# Patient Record
Sex: Female | Born: 1980 | Race: White | Hispanic: No | Smoking: Former smoker
Health system: Southern US, Community
[De-identification: ages and names within clinical notes are randomized; demographics above are authoritative.]

## PROBLEM LIST (undated history)

## (undated) DIAGNOSIS — E7800 Pure hypercholesterolemia, unspecified: Secondary | ICD-10-CM

## (undated) DIAGNOSIS — G43909 Migraine, unspecified, not intractable, without status migrainosus: Secondary | ICD-10-CM

## (undated) DIAGNOSIS — L709 Acne, unspecified: Secondary | ICD-10-CM

## (undated) DIAGNOSIS — E669 Obesity, unspecified: Secondary | ICD-10-CM

## (undated) DIAGNOSIS — I10 Essential (primary) hypertension: Secondary | ICD-10-CM

## (undated) DIAGNOSIS — M1990 Unspecified osteoarthritis, unspecified site: Secondary | ICD-10-CM

## (undated) DIAGNOSIS — F17200 Nicotine dependence, unspecified, uncomplicated: Secondary | ICD-10-CM

## (undated) DIAGNOSIS — F32A Depression, unspecified: Secondary | ICD-10-CM

## (undated) HISTORY — DX: Acne, unspecified: L70.9

## (undated) HISTORY — PX: CHOLECYSTECTOMY: SHX55

## (undated) HISTORY — DX: Obesity, unspecified: E66.9

## (undated) HISTORY — DX: Unspecified osteoarthritis, unspecified site: M19.90

## (undated) HISTORY — DX: Pure hypercholesterolemia, unspecified: E78.00

## (undated) HISTORY — DX: Nicotine dependence, unspecified, uncomplicated: F17.200

## (undated) HISTORY — DX: Essential (primary) hypertension: I10

## (undated) HISTORY — DX: Migraine, unspecified, not intractable, without status migrainosus: G43.909

## (undated) HISTORY — DX: Depression, unspecified: F32.A

---

## 2001-03-25 ENCOUNTER — Emergency Department (HOSPITAL_COMMUNITY): Admission: EM | Admit: 2001-03-25 | Discharge: 2001-03-25 | Payer: Self-pay | Admitting: *Deleted

## 2001-12-30 ENCOUNTER — Emergency Department (HOSPITAL_COMMUNITY): Admission: EM | Admit: 2001-12-30 | Discharge: 2001-12-30 | Payer: Self-pay | Admitting: Emergency Medicine

## 2002-03-22 ENCOUNTER — Emergency Department (HOSPITAL_COMMUNITY): Admission: EM | Admit: 2002-03-22 | Discharge: 2002-03-22 | Payer: Self-pay | Admitting: *Deleted

## 2003-06-21 ENCOUNTER — Ambulatory Visit (HOSPITAL_COMMUNITY): Admission: RE | Admit: 2003-06-21 | Discharge: 2003-06-21 | Payer: Self-pay | Admitting: Family Medicine

## 2003-06-24 ENCOUNTER — Ambulatory Visit (HOSPITAL_COMMUNITY): Admission: RE | Admit: 2003-06-24 | Discharge: 2003-06-24 | Payer: Self-pay | Admitting: Family Medicine

## 2003-06-25 ENCOUNTER — Emergency Department (HOSPITAL_COMMUNITY): Admission: EM | Admit: 2003-06-25 | Discharge: 2003-06-25 | Payer: Self-pay | Admitting: Emergency Medicine

## 2003-06-28 ENCOUNTER — Observation Stay (HOSPITAL_COMMUNITY): Admission: RE | Admit: 2003-06-28 | Discharge: 2003-06-29 | Payer: Self-pay | Admitting: General Surgery

## 2004-05-21 ENCOUNTER — Ambulatory Visit: Payer: Self-pay | Admitting: Family Medicine

## 2004-06-18 ENCOUNTER — Ambulatory Visit: Payer: Self-pay | Admitting: Family Medicine

## 2004-07-10 ENCOUNTER — Emergency Department (HOSPITAL_COMMUNITY): Admission: EM | Admit: 2004-07-10 | Discharge: 2004-07-10 | Payer: Self-pay | Admitting: Emergency Medicine

## 2004-11-30 ENCOUNTER — Emergency Department (HOSPITAL_COMMUNITY): Admission: EM | Admit: 2004-11-30 | Discharge: 2004-11-30 | Payer: Self-pay | Admitting: Emergency Medicine

## 2004-12-06 ENCOUNTER — Ambulatory Visit: Payer: Self-pay | Admitting: Family Medicine

## 2004-12-07 ENCOUNTER — Ambulatory Visit: Payer: Self-pay | Admitting: Family Medicine

## 2004-12-21 ENCOUNTER — Ambulatory Visit (HOSPITAL_COMMUNITY): Admission: RE | Admit: 2004-12-21 | Discharge: 2004-12-21 | Payer: Self-pay | Admitting: Family Medicine

## 2005-01-03 ENCOUNTER — Ambulatory Visit: Payer: Self-pay | Admitting: Family Medicine

## 2005-02-06 ENCOUNTER — Ambulatory Visit: Payer: Self-pay | Admitting: Family Medicine

## 2005-04-03 ENCOUNTER — Ambulatory Visit: Payer: Self-pay | Admitting: Family Medicine

## 2005-06-20 ENCOUNTER — Ambulatory Visit: Payer: Self-pay | Admitting: Family Medicine

## 2005-08-28 ENCOUNTER — Ambulatory Visit: Payer: Self-pay | Admitting: Family Medicine

## 2005-09-25 ENCOUNTER — Ambulatory Visit: Payer: Self-pay | Admitting: Family Medicine

## 2005-10-25 ENCOUNTER — Ambulatory Visit: Payer: Self-pay | Admitting: Family Medicine

## 2005-11-21 ENCOUNTER — Ambulatory Visit: Payer: Self-pay | Admitting: Family Medicine

## 2006-02-05 ENCOUNTER — Ambulatory Visit: Payer: Self-pay | Admitting: Family Medicine

## 2006-03-06 ENCOUNTER — Ambulatory Visit: Payer: Self-pay | Admitting: Family Medicine

## 2006-04-03 ENCOUNTER — Ambulatory Visit: Payer: Self-pay | Admitting: Family Medicine

## 2006-06-24 ENCOUNTER — Ambulatory Visit: Payer: Self-pay | Admitting: Family Medicine

## 2006-07-16 ENCOUNTER — Other Ambulatory Visit: Admission: RE | Admit: 2006-07-16 | Discharge: 2006-07-16 | Payer: Self-pay | Admitting: Family Medicine

## 2006-07-16 ENCOUNTER — Ambulatory Visit: Payer: Self-pay | Admitting: Family Medicine

## 2006-07-18 ENCOUNTER — Encounter: Payer: Self-pay | Admitting: Family Medicine

## 2006-07-18 LAB — CONVERTED CEMR LAB: Chlamydia, DNA Probe: NEGATIVE

## 2006-07-22 ENCOUNTER — Ambulatory Visit: Payer: Self-pay | Admitting: Family Medicine

## 2006-07-22 LAB — CONVERTED CEMR LAB
BUN: 16 mg/dL (ref 6–23)
Calcium: 9.2 mg/dL (ref 8.4–10.5)
Glucose, Bld: 103 mg/dL — ABNORMAL HIGH (ref 70–99)

## 2006-07-23 ENCOUNTER — Ambulatory Visit (HOSPITAL_COMMUNITY): Admission: RE | Admit: 2006-07-23 | Discharge: 2006-07-23 | Payer: Self-pay | Admitting: Family Medicine

## 2006-07-29 ENCOUNTER — Ambulatory Visit (HOSPITAL_COMMUNITY): Admission: RE | Admit: 2006-07-29 | Discharge: 2006-07-29 | Payer: Self-pay | Admitting: Family Medicine

## 2006-07-29 ENCOUNTER — Ambulatory Visit: Payer: Self-pay | Admitting: Family Medicine

## 2006-07-31 ENCOUNTER — Encounter: Payer: Self-pay | Admitting: Family Medicine

## 2006-07-31 LAB — CONVERTED CEMR LAB

## 2006-08-19 ENCOUNTER — Ambulatory Visit: Payer: Self-pay | Admitting: Family Medicine

## 2006-09-03 ENCOUNTER — Ambulatory Visit: Payer: Self-pay | Admitting: Family Medicine

## 2006-09-03 LAB — CONVERTED CEMR LAB: GC Probe Amp, Genital: NEGATIVE

## 2006-09-04 ENCOUNTER — Encounter: Payer: Self-pay | Admitting: Family Medicine

## 2006-09-04 LAB — CONVERTED CEMR LAB
Candida species: NEGATIVE
Trichomonal Vaginitis: NEGATIVE

## 2006-10-16 ENCOUNTER — Ambulatory Visit: Payer: Self-pay | Admitting: Family Medicine

## 2006-11-28 ENCOUNTER — Ambulatory Visit: Payer: Self-pay | Admitting: Family Medicine

## 2006-12-12 ENCOUNTER — Emergency Department (HOSPITAL_COMMUNITY): Admission: EM | Admit: 2006-12-12 | Discharge: 2006-12-12 | Payer: Self-pay | Admitting: Emergency Medicine

## 2006-12-16 ENCOUNTER — Ambulatory Visit: Payer: Self-pay | Admitting: Family Medicine

## 2007-01-21 ENCOUNTER — Ambulatory Visit (HOSPITAL_COMMUNITY): Admission: RE | Admit: 2007-01-21 | Discharge: 2007-01-21 | Payer: Self-pay | Admitting: Family Medicine

## 2007-01-21 ENCOUNTER — Ambulatory Visit: Payer: Self-pay | Admitting: Family Medicine

## 2007-04-30 ENCOUNTER — Encounter: Payer: Self-pay | Admitting: Family Medicine

## 2007-09-23 DIAGNOSIS — IMO0002 Reserved for concepts with insufficient information to code with codable children: Secondary | ICD-10-CM

## 2007-09-23 DIAGNOSIS — M1710 Unilateral primary osteoarthritis, unspecified knee: Secondary | ICD-10-CM | POA: Insufficient documentation

## 2008-01-25 IMAGING — US US PELVIS COMPLETE MODIFY
1 series · 14 of 25 positions shown · non-contrast
Comparison: none

CLINICAL DATA: Low attenuation in adnexal regions noted on CT.  Followup ultrasound.  
TRANSABDOMINAL AND TRANSVAGINAL PELVIC ULTRASOUND:
TECHNIQUE: Both transabdominal and transvaginal ultrasound examinations of the pelvis were performed including evaluation of the uterus, ovaries, adnexal regions, and pelvic cul-de-sac.

[Series 1: us pelvis complete modify · 0.26mm/px · 14 of 62 slices shown]
[im 1/62]
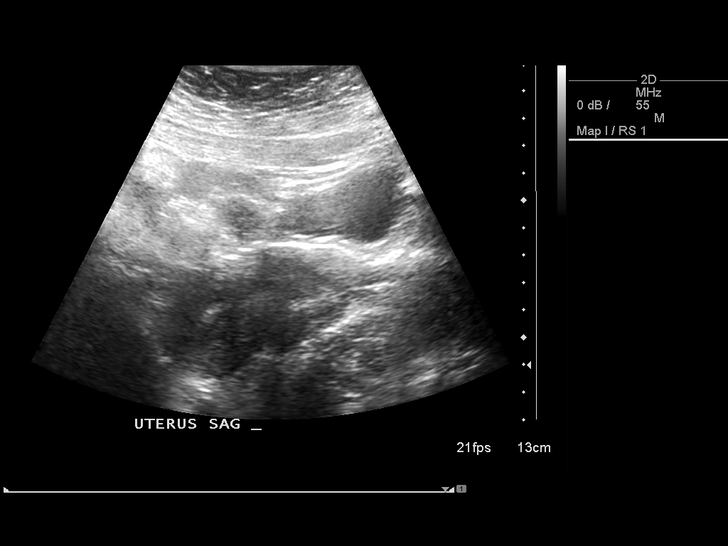
[im 6/62]
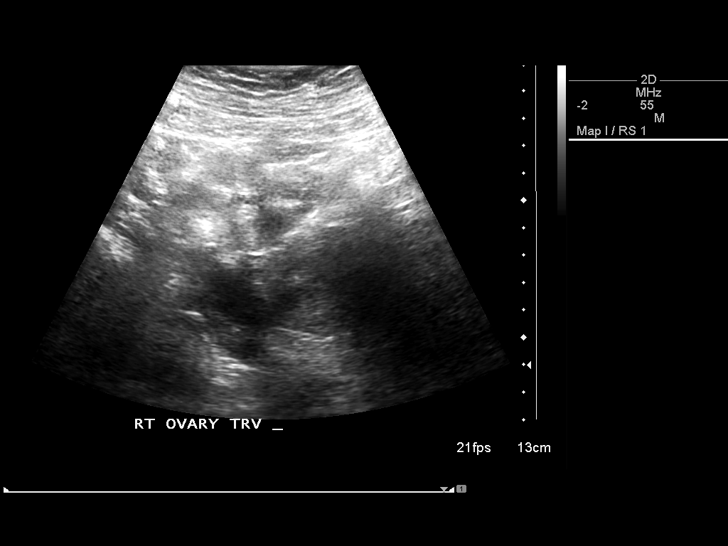
[im 11/62]
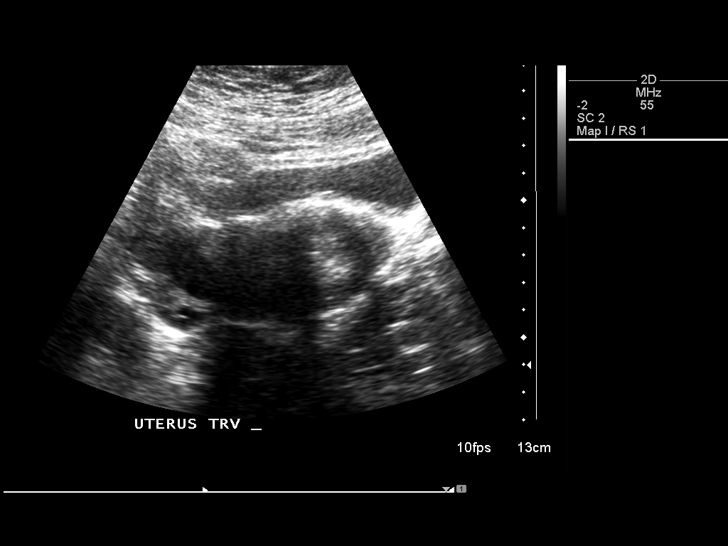
[im 16/62]
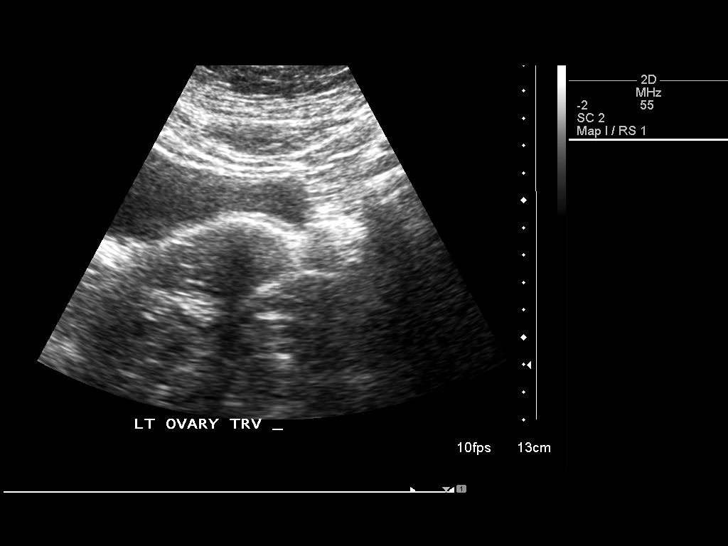
[im 21/62]
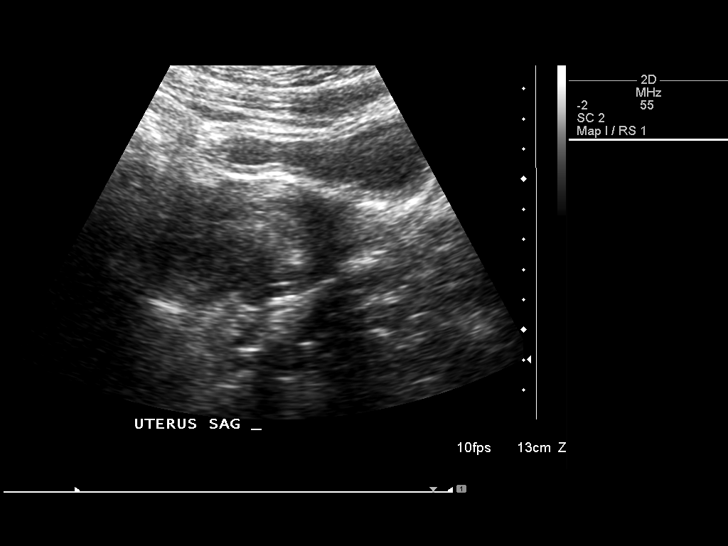
[im 23/62]
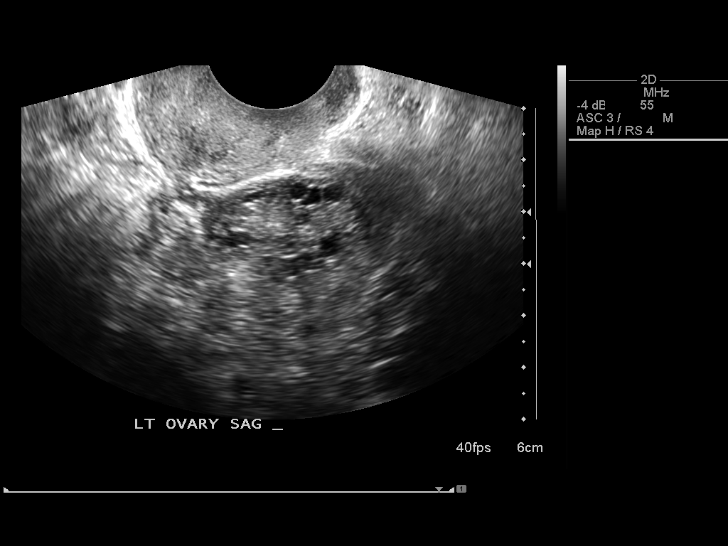
[im 28/62]
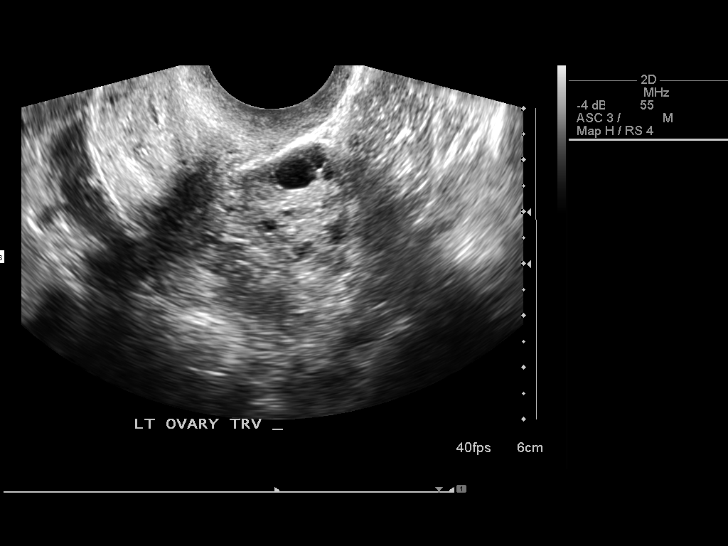
[im 34/62]
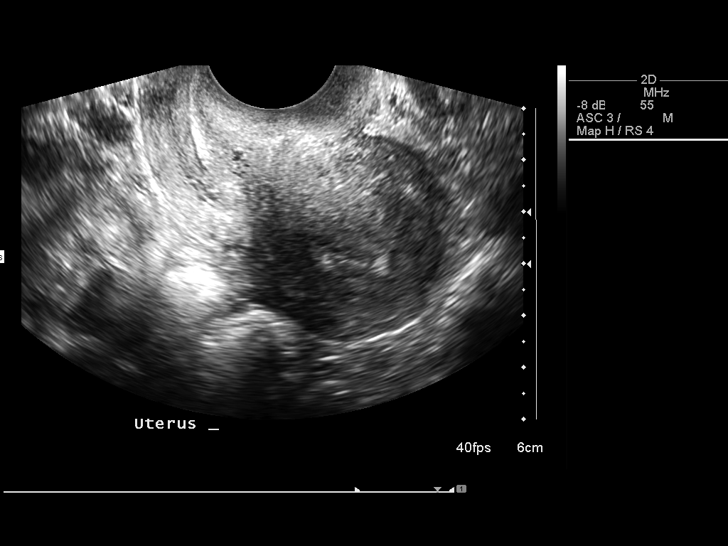
[im 39/62]
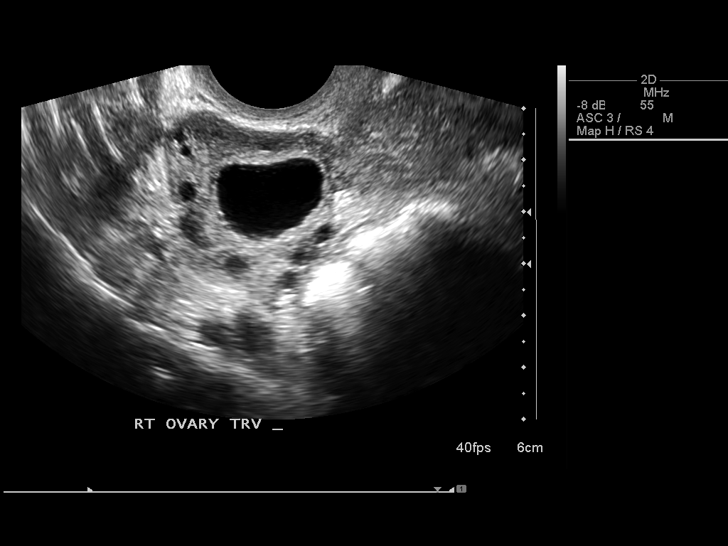
[im 41/62]
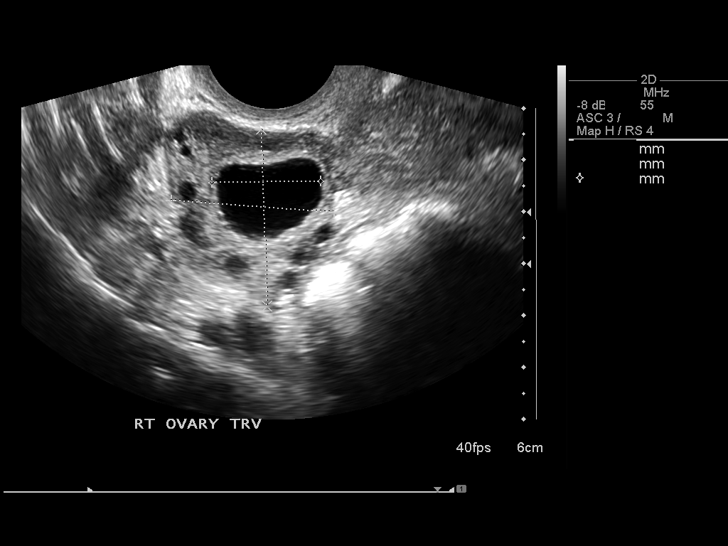
[im 46/62]
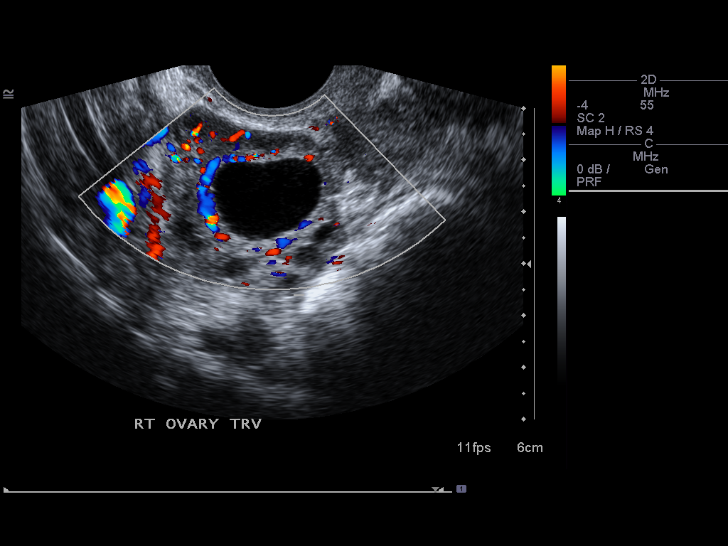
[im 51/62]
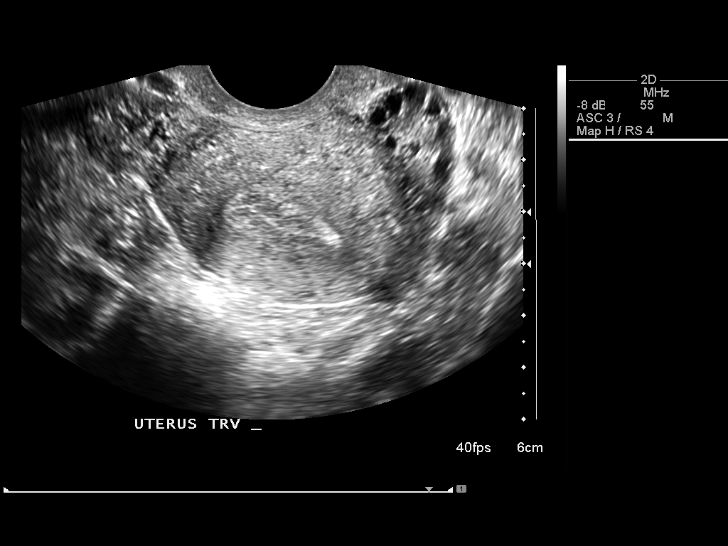
[im 56/62]
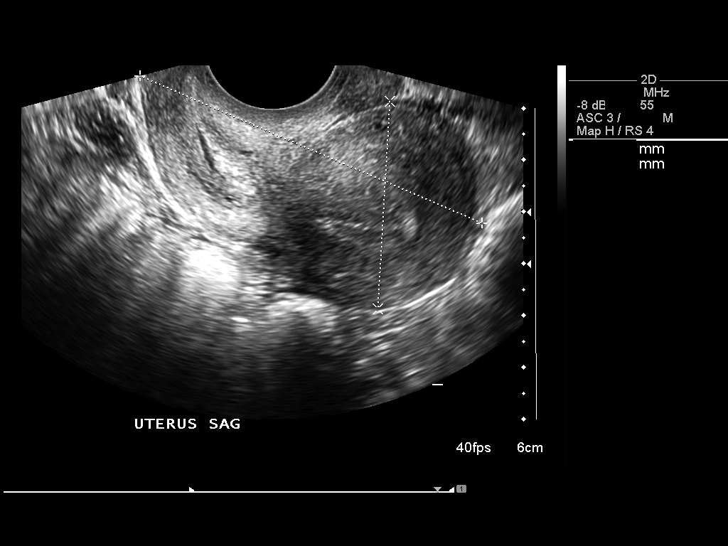
[im 62/62]
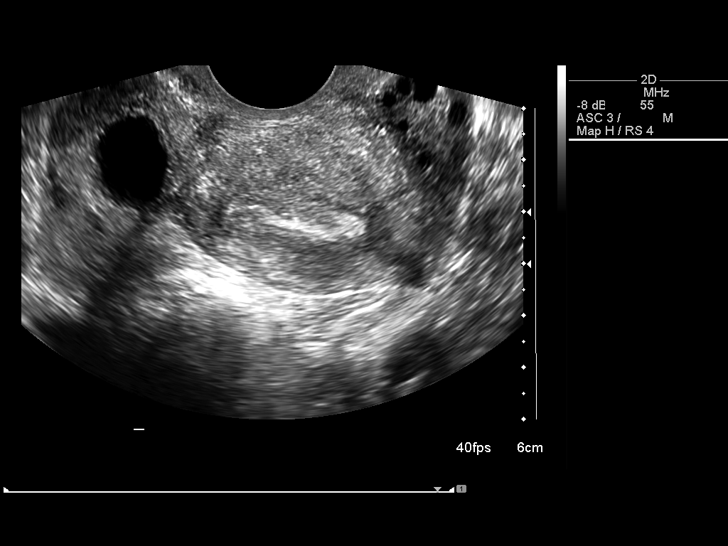

[14 of 25 positions shown; findings below may reference images not displayed]

FINDINGS: Retroverted uterus.  Uterus measures 8.0 cm in length and the fundus measures 3.8 x 4.9 cm.  Endometrial complex appears thickened in the lower uterine segment measuring 1.2 cm.  Small cyst in the right ovary measuring 2.5 cm.  Ovarian follicles bilaterally.  Right ovary measures 3.2 x 3.4 x 4.1 cm.  Left ovary measures 2.0 x 2.6 x 3.3 cm.  No free pelvic fluid.
IMPRESSION: Retroverted uterus.  Endometrial complex appears somewhat thickened in the lower uterine segment but is likely within normal limits.  No evidence of a mass in that region.  Right ovarian cyst.  No evidence of hydrosalpinx the possibility of which was suggested by CT.

## 2008-03-02 ENCOUNTER — Ambulatory Visit: Payer: Self-pay | Admitting: Family Medicine

## 2008-03-02 DIAGNOSIS — L708 Other acne: Secondary | ICD-10-CM | POA: Insufficient documentation

## 2008-03-02 DIAGNOSIS — R51 Headache: Secondary | ICD-10-CM | POA: Insufficient documentation

## 2008-03-02 DIAGNOSIS — R519 Headache, unspecified: Secondary | ICD-10-CM | POA: Insufficient documentation

## 2008-03-03 ENCOUNTER — Telehealth: Payer: Self-pay | Admitting: Family Medicine

## 2008-03-04 ENCOUNTER — Telehealth: Payer: Self-pay | Admitting: Family Medicine

## 2008-03-04 ENCOUNTER — Encounter: Payer: Self-pay | Admitting: Family Medicine

## 2008-04-04 ENCOUNTER — Telehealth: Payer: Self-pay | Admitting: Family Medicine

## 2008-04-13 ENCOUNTER — Ambulatory Visit: Payer: Self-pay | Admitting: Family Medicine

## 2008-05-17 ENCOUNTER — Telehealth: Payer: Self-pay | Admitting: Family Medicine

## 2008-05-18 ENCOUNTER — Ambulatory Visit: Payer: Self-pay | Admitting: Family Medicine

## 2008-05-20 ENCOUNTER — Encounter: Payer: Self-pay | Admitting: Family Medicine

## 2008-05-23 ENCOUNTER — Encounter: Payer: Self-pay | Admitting: Family Medicine

## 2008-05-24 ENCOUNTER — Ambulatory Visit (HOSPITAL_COMMUNITY): Admission: RE | Admit: 2008-05-24 | Discharge: 2008-05-24 | Payer: Self-pay | Admitting: Family Medicine

## 2008-06-08 ENCOUNTER — Encounter: Payer: Self-pay | Admitting: Family Medicine

## 2008-06-08 ENCOUNTER — Ambulatory Visit: Payer: Self-pay | Admitting: Family Medicine

## 2008-06-08 ENCOUNTER — Other Ambulatory Visit: Admission: RE | Admit: 2008-06-08 | Discharge: 2008-06-08 | Payer: Self-pay | Admitting: Family Medicine

## 2008-06-09 ENCOUNTER — Encounter: Payer: Self-pay | Admitting: Family Medicine

## 2008-06-09 LAB — CONVERTED CEMR LAB
Chlamydia, DNA Probe: NEGATIVE
Gardnerella vaginalis: NEGATIVE

## 2008-06-21 ENCOUNTER — Telehealth: Payer: Self-pay | Admitting: Family Medicine

## 2008-07-04 ENCOUNTER — Telehealth: Payer: Self-pay | Admitting: Family Medicine

## 2008-07-07 ENCOUNTER — Encounter: Payer: Self-pay | Admitting: Family Medicine

## 2008-07-13 ENCOUNTER — Telehealth: Payer: Self-pay | Admitting: Family Medicine

## 2009-03-15 ENCOUNTER — Encounter (INDEPENDENT_AMBULATORY_CARE_PROVIDER_SITE_OTHER): Payer: Self-pay

## 2009-03-15 ENCOUNTER — Ambulatory Visit: Payer: Self-pay | Admitting: Family Medicine

## 2009-03-15 DIAGNOSIS — J309 Allergic rhinitis, unspecified: Secondary | ICD-10-CM | POA: Insufficient documentation

## 2009-03-27 ENCOUNTER — Encounter: Payer: Self-pay | Admitting: Family Medicine

## 2009-04-06 ENCOUNTER — Encounter: Payer: Self-pay | Admitting: Family Medicine

## 2009-05-02 ENCOUNTER — Ambulatory Visit: Payer: Self-pay | Admitting: Family Medicine

## 2009-05-02 LAB — CONVERTED CEMR LAB
Bilirubin Urine: NEGATIVE
Glucose, Urine, Semiquant: NEGATIVE
Ketones, urine, test strip: NEGATIVE
Specific Gravity, Urine: 1.015
Urobilinogen, UA: 0.2
WBC Urine, dipstick: NEGATIVE

## 2009-05-04 ENCOUNTER — Encounter: Payer: Self-pay | Admitting: Family Medicine

## 2009-05-17 ENCOUNTER — Ambulatory Visit: Payer: Self-pay | Admitting: Family Medicine

## 2009-05-19 ENCOUNTER — Encounter: Payer: Self-pay | Admitting: Family Medicine

## 2009-05-30 ENCOUNTER — Telehealth: Payer: Self-pay | Admitting: Family Medicine

## 2009-06-01 ENCOUNTER — Encounter: Payer: Self-pay | Admitting: Family Medicine

## 2009-06-14 ENCOUNTER — Other Ambulatory Visit: Admission: RE | Admit: 2009-06-14 | Discharge: 2009-06-14 | Payer: Self-pay | Admitting: Family Medicine

## 2009-06-14 ENCOUNTER — Ambulatory Visit: Payer: Self-pay | Admitting: Family Medicine

## 2009-06-14 LAB — CONVERTED CEMR LAB: Pap Smear: NEGATIVE

## 2009-06-21 ENCOUNTER — Telehealth: Payer: Self-pay | Admitting: Family Medicine

## 2009-06-23 ENCOUNTER — Encounter: Payer: Self-pay | Admitting: Family Medicine

## 2009-07-06 ENCOUNTER — Telehealth: Payer: Self-pay | Admitting: Family Medicine

## 2009-07-07 ENCOUNTER — Ambulatory Visit (HOSPITAL_COMMUNITY): Admission: RE | Admit: 2009-07-07 | Discharge: 2009-07-07 | Payer: Self-pay | Admitting: Family Medicine

## 2009-09-13 ENCOUNTER — Ambulatory Visit: Payer: Self-pay | Admitting: Family Medicine

## 2009-09-15 ENCOUNTER — Encounter: Payer: Self-pay | Admitting: Family Medicine

## 2009-10-04 ENCOUNTER — Ambulatory Visit: Payer: Self-pay | Admitting: Family Medicine

## 2009-10-04 DIAGNOSIS — R5383 Other fatigue: Secondary | ICD-10-CM

## 2009-10-04 DIAGNOSIS — R5381 Other malaise: Secondary | ICD-10-CM

## 2009-10-04 DIAGNOSIS — N644 Mastodynia: Secondary | ICD-10-CM

## 2009-10-04 DIAGNOSIS — K648 Other hemorrhoids: Secondary | ICD-10-CM

## 2009-10-05 ENCOUNTER — Encounter: Payer: Self-pay | Admitting: Family Medicine

## 2009-10-06 ENCOUNTER — Encounter: Payer: Self-pay | Admitting: Family Medicine

## 2009-10-09 LAB — CONVERTED CEMR LAB
BUN: 15 mg/dL (ref 6–23)
Basophils Absolute: 0 10*3/uL (ref 0.0–0.1)
Basophils Relative: 0 % (ref 0–1)
CO2: 22 meq/L (ref 19–32)
Eosinophils Absolute: 0.3 10*3/uL (ref 0.0–0.7)
Glucose, Bld: 70 mg/dL (ref 70–99)
HCT: 37.5 % (ref 36.0–46.0)
Hemoglobin: 12.3 g/dL (ref 12.0–15.0)
LDL Cholesterol: 142 mg/dL — ABNORMAL HIGH (ref 0–99)
Lymphs Abs: 1.8 10*3/uL (ref 0.7–4.0)
MCV: 89.1 fL (ref 78.0–100.0)
Monocytes Relative: 6 % (ref 3–12)
Neutro Abs: 4 10*3/uL (ref 1.7–7.7)
Platelets: 267 10*3/uL (ref 150–400)
RDW: 13.7 % (ref 11.5–15.5)
TSH: 3.238 microintl units/mL (ref 0.350–4.500)

## 2009-10-10 ENCOUNTER — Encounter (INDEPENDENT_AMBULATORY_CARE_PROVIDER_SITE_OTHER): Payer: Self-pay

## 2009-10-10 ENCOUNTER — Ambulatory Visit: Payer: Self-pay | Admitting: Family Medicine

## 2009-10-23 ENCOUNTER — Telehealth: Payer: Self-pay | Admitting: Family Medicine

## 2009-10-24 ENCOUNTER — Ambulatory Visit: Payer: Self-pay | Admitting: Family Medicine

## 2009-10-24 ENCOUNTER — Telehealth: Payer: Self-pay | Admitting: Physician Assistant

## 2009-10-24 DIAGNOSIS — H6980 Other specified disorders of Eustachian tube, unspecified ear: Secondary | ICD-10-CM

## 2009-10-27 HISTORY — PX: COLONOSCOPY: SHX174

## 2009-11-01 ENCOUNTER — Ambulatory Visit: Payer: Self-pay | Admitting: Internal Medicine

## 2009-11-01 DIAGNOSIS — K921 Melena: Secondary | ICD-10-CM | POA: Insufficient documentation

## 2009-11-02 ENCOUNTER — Encounter: Payer: Self-pay | Admitting: Internal Medicine

## 2009-11-07 ENCOUNTER — Ambulatory Visit: Payer: Self-pay | Admitting: Internal Medicine

## 2009-11-07 ENCOUNTER — Ambulatory Visit (HOSPITAL_COMMUNITY): Admission: RE | Admit: 2009-11-07 | Discharge: 2009-11-07 | Payer: Self-pay | Admitting: Internal Medicine

## 2009-12-18 ENCOUNTER — Telehealth: Payer: Self-pay | Admitting: Family Medicine

## 2010-02-06 ENCOUNTER — Ambulatory Visit: Payer: Self-pay | Admitting: Family Medicine

## 2010-02-19 ENCOUNTER — Telehealth (INDEPENDENT_AMBULATORY_CARE_PROVIDER_SITE_OTHER): Payer: Self-pay | Admitting: *Deleted

## 2010-02-21 ENCOUNTER — Telehealth: Payer: Self-pay | Admitting: Family Medicine

## 2010-03-15 ENCOUNTER — Encounter: Payer: Self-pay | Admitting: Family Medicine

## 2010-05-19 ENCOUNTER — Encounter: Payer: Self-pay | Admitting: Family Medicine

## 2010-05-29 NOTE — Letter (Signed)
Summary: HEALTH EXAMINATION CERTIFICATE  HEALTH EXAMINATION CERTIFICATE   Imported By: Lind Guest 03/15/2010 08:35:41  _____________________________________________________________________  External Attachment:    Type:   Image     Comment:   External Document

## 2010-05-29 NOTE — Letter (Signed)
Summary: staff medical eport  staff medical eport   Imported By: Lind Guest 10/06/2009 16:25:04  _____________________________________________________________________  External Attachment:    Type:   Image     Comment:   External Document

## 2010-05-29 NOTE — Letter (Signed)
Summary: progress notes  progress notes   Imported By: Curtis Sites 10/13/2009 13:04:36  _____________________________________________________________________  External Attachment:    Type:   Image     Comment:   External Document

## 2010-05-29 NOTE — Assessment & Plan Note (Signed)
Summary: EARS- room1   Vital Signs:  Patient profile:   30 year old female Menstrual status:  regular Height:      63 inches Weight:      288 pounds BMI:     51.20 O2 Sat:      99 % on Room air Pulse rate:   73 / minute Resp:     16 per minute BP sitting:   102 / 70  (left arm)  Vitals Entered By: Adella Hare LPN (October 24, 2009 9:04 AM) CC: ears feel stopped up Is Patient Diabetic? No Pain Assessment Patient in pain? no      Comments did not bring meds to ov   Primary Provider:  simpson, margaret  CC:  ears feel stopped up.  History of Present Illness: Pt presents today with c/o Lt ear pressure and decreased hearing.  She sometimes has some popping or crackling in it.  She is finishing an antibiotic prescription for head congestion and sore throat.  Other than her ear she is feeling much better.  Cough is gone.  No fever or chills.    Allergies (verified): No Known Drug Allergies  Past History:  Past medical history reviewed for relevance to current acute and chronic problems.  Past Medical History: Reviewed history from 03/02/2008 and no changes required. NICOTINE ADDICTION (ICD-305.1) OBESITY (ICD-278.00) ARTHRITIS, LEFT KNEE (ICD-716.96) Facial acne migraine headaches  Review of Systems General:  Denies chills and fever. ENT:  Complains of earache; denies ear discharge, nasal congestion, and sore throat. Resp:  Denies cough, shortness of breath, and sputum productive.  Physical Exam  General:  Well-developed,well-nourished,in no acute distress; alert,appropriate and cooperative throughout examination Head:  Normocephalic and atraumatic without obvious abnormalities. No apparent alopecia or balding. Ears:  External ear exam shows no significant lesions or deformities.  Otoscopic examination reveals clear canals, tympanic membranes are intact bilaterally without bulging, retraction, inflammation or discharge. Hearing is grossly normal bilaterally. Nose:  no  external deformity and no nasal discharge.  Nasal turbs are mod swollen bilat without erythema or pallor. Mouth:  Oral mucosa and oropharynx without lesions or exudates.  Teeth in good repair. Neck:  No deformities, masses, or tenderness noted. Lungs:  Normal respiratory effort, chest expands symmetrically. Lungs are clear to auscultation, no crackles or wheezes. Heart:  Normal rate and regular rhythm. S1 and S2 normal without gallop, murmur, click, rub or other extra sounds. Cervical Nodes:  No lymphadenopathy noted Psych:  Cognition and judgment appear intact. Alert and cooperative with normal attention span and concentration. No apparent delusions, illusions, hallucinations   Impression & Recommendations:  Problem # 1:  EUSTACHIAN TUBE DYSFUNCTION, LEFT (ICD-381.81) Assessment New Valsalva maneuver periodically to try to open eustachian tube.  Complete Medication List: 1)  Tri-sprintec 0.035 Mg Tabs (Norgestimate-ethinyl estradiol) .... Take 1 tablet by mouth once a day 2)  Fluconazole 150 Mg Tabs (Fluconazole) .... Take 1 tablet by mouth once a day as needed 3)  Ciprofloxacin Hcl 500 Mg Tabs (Ciprofloxacin hcl) .... Take 1 tablet by mouth two times a day 4)  Flonase 50 Mcg/act Susp (Fluticasone propionate) .... Use 2 sprays each nostril once daily  Patient Instructions: 1)  Please schedule a follow-up appointment as needed. 2)  I have prescribed a nose spray for you to use once daily until your ear improves. 3)  Restart Loratadine (Claritin) once daily. 4)  Complete the last couple days of your antibiotic prescription. Prescriptions: FLONASE 50 MCG/ACT SUSP (FLUTICASONE PROPIONATE) use 2 sprays  each nostril once daily  #1 x 0   Entered and Authorized by:   Esperanza Sheets PA   Signed by:   Esperanza Sheets PA on 10/24/2009   Method used:   Electronically to        [facility]  [address] (retail)       [address]       [place], [place]  [address]       Ph:  [phone]       Fax: [phone]   RxID:   [id number]

## 2010-05-29 NOTE — Letter (Signed)
Summary: Internal Other /TCS orders  Internal Other /TCS orders   Imported By: Cloria Spring LPN 16/01/9603 54:09:81  _____________________________________________________________________  External Attachment:    Type:   Image     Comment:   External Document

## 2010-05-29 NOTE — Progress Notes (Signed)
Summary: yeast infection  Phone Note Call from Patient   Summary of Call: took all ant. now needs a yeast infection called [facility] in [place] Initial call taken by: Lind Guest,  February 19, 2010 11:41 AM  Follow-up for Phone Call        PLS REFILL FLUCONAZOLE X 1,AND LET HER KNOW Follow-up by: Syliva Overman MD,  February 19, 2010 12:01 PM    Prescriptions: FLUCONAZOLE 150 MG TABS (FLUCONAZOLE) take 1 by mouth for yeast infection  #1 x 0   Entered by:   Mauricia Area CMA   Authorized by:   Syliva Overman MD   Signed by:   Mauricia Area CMA on 02/19/2010   Method used:   Electronically to        [facility]  [address] (retail)       [address]       [place], [place]  [address]       Ph: [phone]       Fax: [phone]   RxID:   [id number]

## 2010-05-29 NOTE — Progress Notes (Signed)
Summary: TET INJ  Phone Note Call from Patient   Summary of Call: NEEDS FOR YOU TO FAX OVER HER LAST TET. SHOT AND FAX TO [facility] AT [phone] Initial call taken by: Lind Guest,  December 18, 2009 1:27 PM  Follow-up for Phone Call        faxed as requested Follow-up by: Adella Hare LPN,  December 18, 2009 1:59 PM      Tetanus/Td Immunization History:    Tetanus/Td # 1:  Historical (04/03/2005)

## 2010-05-29 NOTE — Assessment & Plan Note (Signed)
Summary: uti  Nurse Visit   Vital Signs:  Patient profile:   30 year old female Menstrual status:  regular Height:      63 inches Weight:      284 pounds BMI:     50.49 O2 Sat:      98 % Pulse rate:   77 / minute Resp:     16 per minute  Vitals Entered By: Everitt Amber (May 02, 2009 3:09 PM) CC: c/o urinary frequency, been having really sharp pain in lower abdomen and if she doesn't urinate as soon as she has to go the pain gets worse. She has tried drinking alot of water but it hasn't helped   Allergies: No Known Drug Allergies Laboratory Results   Urine Tests    Routine Urinalysis   Color: lt. yellow Appearance: Clear Glucose: negative   (Normal Range: Negative) Bilirubin: negative   (Normal Range: Negative) Ketone: negative   (Normal Range: Negative) Spec. Gravity: 1.015   (Normal Range: 1.003-1.035) Blood: trace-intact   (Normal Range: Negative) pH: 5.5   (Normal Range: 5.0-8.0) Protein: negative   (Normal Range: Negative) Urobilinogen: 0.2   (Normal Range: 0-1) Nitrite: negative   (Normal Range: Negative) Leukocyte Esterace: negative   (Normal Range: Negative)     Blood Tests   Date/Time Received: May 02, 2009  Date/Time Reported: May 02, 2009       Orders Added: 1)  UA Dipstick W/ Micro (manual) [81000] 2)  T-Culture, Urine [[id number]] Prescriptions: CIPROFLOXACIN HCL 500 MG TABS (CIPROFLOXACIN HCL) Take 1 tablet by mouth two times a day  #10 x 0   Entered and Authorized by:   Syliva Overman MD   Signed by:   Syliva Overman MD on 05/03/2009   Method used:   Electronically to        [facility]  [address] (retail)       [address]       [place], [place]  [address]       Ph: [phone]       Fax: [phone]   RxID:   [id number]

## 2010-05-29 NOTE — Letter (Signed)
Summary: Out of Work  [facility]  [address]   [place], [place] [address]   Phone: [phone]  Fax: [phone]    October 10, 2009   Employee:  [employer]AMIKA [employer] [facility] [employer]    To Whom It May Concern:   For Medical reasons, please excuse the above named employee from work for the following dates:  Start:   10/10/2009  End:   10/11/2009 To return without restrictions  If you need additional information, please feel free to contact our office.         Sincerely,    Milus Mallick. Lodema Hong, M.D.

## 2010-05-29 NOTE — Progress Notes (Signed)
  Phone Note Call from Patient   Caller: Patient Summary of Call: patient worked out earlier in the week, states she over done it because she is sore all over  patient had ibuprofen 800 on file, refilled x 1  Initial call taken by: Adella Hare LPN,  June 21, 2009 4:07 PM

## 2010-05-29 NOTE — Progress Notes (Signed)
Summary: wants a x-ray  Phone Note Call from Patient   Summary of Call: right ankle is hurting her. can she get a x-ray done   [phone] Initial call taken by: Rudene Anda,  July 06, 2009 10:07 AM  Follow-up for Phone Call        Right ankle hurting, she's not sure how she did it and now when she puts pressure on it,  it hurts and if she rotates it to the right it hurts. She has been taking ibuprofen and its still bothering her. Wants right ankle xray to evaluate pain.    Ok per Dr. Lodema Hong, Pt aware and faxing order to radiology dept  Follow-up by: Everitt Amber LPN,  July 06, 2009 1:31 PM  New Problems: ANKLE PAIN, RIGHT (ICD-719.47)   New Problems: ANKLE PAIN, RIGHT (ICD-719.47)

## 2010-05-29 NOTE — Assessment & Plan Note (Signed)
Summary: office visit   Vital Signs:  Patient profile:   30 year old female Menstrual status:  regular Height:      63 inches Weight:      286 pounds BMI:     50.85 O2 Sat:      98 % Temp:     99.3 degrees F oral Pulse rate:   84 / minute Pulse rhythm:   regular Resp:     16 per minute BP sitting:   114 / 80  (left arm) Cuff size:   xl  Vitals Entered By: Everitt Amber LPN (October 10, 2009 1:07 PM)  Nutrition Counseling: Patient's BMI is greater than 25 and therefore counseled on weight management options. CC: bodyaches, headache, congestion and sore throat, low grade temp   Primary Care Provider:  Jamieon Lannen  CC:  bodyaches, headache, congestion and sore throat, and low grade temp.  History of Present Illness: 2 dayn h/o sore throat, myalgias, fever, also painful left neck gland. Positive exposure to resp infection , works in daycare, no known strep contact. She also has intermittent cough which at tiomes produces yellow sputum. Prior to this she had been doing well.  Current Medications (verified): 1)  Tri-Sprintec 0.035 Mg Tabs (Norgestimate-Ethinyl Estradiol) .... Take 1 Tablet By Mouth Once A Day  Allergies (verified): No Known Drug Allergies  Review of Systems General:  Complains of chills, fatigue, fever, loss of appetite, and malaise. Eyes:  Denies discharge and red eye. ENT:  Complains of nasal congestion, postnasal drainage, sinus pressure, and sore throat; denies ringing in ears. CV:  Denies chest pain or discomfort and palpitations. Resp:  Complains of cough and sputum productive; denies shortness of breath and wheezing; 2 day h/o sputum is yellow at times. GI:  Denies abdominal pain, constipation, diarrhea, nausea, and vomiting. GU:  Denies dysuria and urinary frequency. Endo:  Denies cold intolerance, excessive hunger, excessive thirst, excessive urination, heat intolerance, polyuria, and weight change. Heme:  Denies abnormal bruising and  bleeding. Allergy:  Complains of seasonal allergies.  Physical Exam  General:  Well-developed,obese,in no acute distress; alert,appropriate and cooperative throughout examinationPt in pain HEENT: No facial asymmetry,  EOMI, No sinus tenderness, TM's Clear, oropharynx  pink and moist. No visible exudate. Tencder bilateral anterior cerv ical adenopathy.  Chest: Clear to auscultation bilaterally.  CVS: S1, S2, No murmurs, No S3.   Abd: Soft, Nontender.  MS: Adequate ROM spine, hips, shoulders and knees.  Ext: No edema.   CNS: CN 2-12 intact, power tone and sensation normal throughout.   Skin: Intact, no visible lesions or rashes.  Psych: Good eye contact, normal affect.  Memory intact, not anxious or depressed appearing. breasts: no mases, tenderness or nipple d/c , no axill;ary nodes.    Impression & Recommendations:  Problem # 1:  VIRAL URI (ICD-465.9) Assessment Comment Only  Instructed on symptomatic treatment. Call if symptoms persist or worsen.   Problem # 2:  PHARYNGITIS, ACUTE (ICD-462) Assessment: Comment Only  The following medications were removed from the medication list:    Penicillin V Potassium 500 Mg Tabs (Penicillin v potassium) .Marland Kitchen... Take 1 tablet by mouth three times a day Her updated medication list for this problem includes:    Ciprofloxacin Hcl 500 Mg Tabs (Ciprofloxacin hcl) .Marland Kitchen... Take 1 tablet by mouth two times a day  Orders: Rapid Strep (87880)negative, p[t reassured not strep throat  RTW in am  Instructed to complete antibiotics and call if not improved in 48 hours.  Problem # 3:  OBESITY (ICD-278.00) Assessment: Improved  Ht: 63 (10/10/2009)   Wt: 286 (10/10/2009)   BMI: 50.85 (10/10/2009)  Complete Medication List: 1)  Tri-sprintec 0.035 Mg Tabs (Norgestimate-ethinyl estradiol) .... Take 1 tablet by mouth once a day 2)  Fluconazole 150 Mg Tabs (Fluconazole) .... Take 1 tablet by mouth once a day as needed 3)  Ciprofloxacin Hcl 500 Mg Tabs  (Ciprofloxacin hcl) .... Take 1 tablet by mouth two times a day  Other Orders: T-Culture, Sputum & Gram Stain ([id number])  Patient Instructions: 1)  f/u as before. 2)  you are being treated for acute pharyngitis and viral syndrome. 3)  pls take med prescribed, gargle 3 to 4 tmes daily with salt water. 4)  return to work tomorrow 5)  Get plenty of rest, drink lots of clear liquids, and use Tylenol or Ibuprofen for fever and comfort. Return in 7-10 days if you're not better:sooner if you're feeling worse. Prescriptions: CIPROFLOXACIN HCL 500 MG TABS (CIPROFLOXACIN HCL) Take 1 tablet by mouth two times a day  #20 x 0   Entered and Authorized by:   Syliva Overman MD   Signed by:   Syliva Overman MD on 10/15/2009   Method used:   Electronically to        [facility]  [address] [facility] (retail)       [address]       [place], [place]  [address]       Ph: [phone]       Fax: [phone]   RxID:   [id number] FLUCONAZOLE 150 MG TABS (FLUCONAZOLE) Take 1 tablet by mouth once a day as needed  #3 x 0   Entered and Authorized by:   Syliva Overman MD   Signed by:   Syliva Overman MD on 10/10/2009   Method used:   Electronically to        [facility]  [address] [facility] (retail)       [address]       [place], [place]  [address]       Ph: [phone]       Fax: [phone]   RxID:   [id number] PENICILLIN V POTASSIUM 500 MG TABS (PENICILLIN V POTASSIUM) Take 1 tablet by mouth three times a day  #21 x 0   Entered and Authorized by:   Syliva Overman MD   Signed by:   Syliva Overman MD on 10/10/2009   Method used:   Electronically to        [facility]  [address] (retail)       [address]       [place], [place]  [address]       Ph: [phone]       Fax: [phone]   RxID:   [id number]   Laboratory Results    Other Tests  Rapid Strep: negative

## 2010-05-29 NOTE — Letter (Signed)
Summary: lab  lab   Imported By: Curtis Sites 10/13/2009 13:03:26  _____________________________________________________________________  External Attachment:    Type:   Image     Comment:   External Document

## 2010-05-29 NOTE — Progress Notes (Signed)
Summary: ear  Phone Note Call from Patient   Summary of Call: ear clogged up and wants to know what she can do for it call back at [phone] Initial call taken by: Lind Guest,  October 23, 2009 8:32 AM  Follow-up for Phone Call        Schedule an appt then we can see why it is clogged and if it needs to be flushed we can do it. Follow-up by: Esperanza Sheets PA,  October 23, 2009 3:15 PM  Additional Follow-up for Phone Call Additional follow up Details #1::        APPT. 6.28.11 WITH DAWN Additional Follow-up by: Lind Guest,  October 23, 2009 4:08 PM

## 2010-05-29 NOTE — Assessment & Plan Note (Signed)
Summary: hemorrhoids- family hx of colon CA - cdg   Visit Type:  Initial Consult Referring Provider:  Lodema Hong Primary Care Provider:  Lodema Hong  Chief Complaint:  hemorrhoids/fh colon cancer.  History of Present Illness: Claire [name] is a pleasant 30 y/o WF, patient of Dr. Lodema Hong, who presents for further evaluation of hematachezia. She has had several episodes of small volume brbpr. Occasional straining with BMs, but not a problem lately. No melena. No abd pain, rectal pain. No GERD, n/v. No weight loss. Mother diagnosed with CRC at age 89. Patient has never had TCS.   Labs 6/11--> normal CBC, TSH, MET-7  Current Medications (verified): 1)  Tri-Sprintec 0.035 Mg Tabs (Norgestimate-Ethinyl Estradiol) .... Take 1 Tablet By Mouth Once A Day 2)  Claritin 10 Mg Tabs (Loratadine) .... One By Mouth Daily  Allergies (verified): No Known Drug Allergies  Past History:  Past Medical History: Reviewed history from 03/02/2008 and no changes required. NICOTINE ADDICTION (ICD-305.1) OBESITY (ICD-278.00) ARTHRITIS, LEFT KNEE (ICD-716.96) Facial acne migraine headaches  Past Surgical History: Reviewed history from 09/23/2007 and no changes required. Cholecystectomy 2005  Family History: NO CHILDREN MOTHER  LIVING  HYPERTENSION,OBESITY, DIAGNOSED  COLON CANCER, age 86 FATHER LIVING / HYPERTENSION, DIABETES TYPE 2, HYPERLIPIDEMIA, OBESITY ONE SISTER LIVING  HEALTHY ONE BROTHER  LIVING HEALTHY  Social History: Reviewed history from 10/04/2009 and no changes required. UNEMPLOYED Single quit nicotine in March 2011 Alcohol use-no Drug use-no  Review of Systems General:  Denies fever, chills, sweats, anorexia, fatigue, weakness, and weight loss. Eyes:  Denies vision loss. ENT:  Denies nasal congestion, sore throat, hoarseness, and difficulty swallowing. CV:  Denies chest pains, angina, palpitations, dyspnea on exertion, and peripheral edema. Resp:  Denies dyspnea at rest, dyspnea with  exercise, cough, sputum, and wheezing. GI:  See HPI. GU:  Denies urinary burning and blood in urine. MS:  Denies joint pain / LOM. Derm:  Denies rash and itching. Neuro:  Complains of headache; denies weakness, memory loss, and confusion. Psych:  Denies depression and anxiety. Endo:  Denies unusual weight change. Heme:  Denies bruising and bleeding. Allergy:  Denies hives and rash.  Vital Signs:  Patient profile:   30 year old female Menstrual status:  regular Height:      63 inches Weight:      292 pounds BMI:     51.91 Temp:     98.1 degrees F oral Pulse rate:   72 / minute BP sitting:   110 / 80  (left arm) Cuff size:   large  Vitals Entered By: Cloria Spring LPN (November 01, 1608 10:36 AM)  Physical Exam  General:  Well developed, well nourished, no acute distress.obese.   Head:  Normocephalic and atraumatic. Eyes:  SCLERA NONICTERIC Mouth:  Oropharyngeal mucosa moist, pink.  No lesions, erythema or exudate.    Neck:  Supple; no masses or thyromegaly. Lungs:  Clear throughout to auscultation. Heart:  Regular rate and rhythm; no murmurs, rubs,  or bruits. Abdomen:  normal bowel sounds, obese, no hernia, no tenderness, no masses, and no hepatomegally or splenomegaly.   Rectal:  deferred until time of colonoscopy.   Extremities:  No clubbing, cyanosis, edema or deformities noted. Neurologic:  Alert and  oriented x4;  grossly normal neurologically. Skin:  Intact without significant lesions or rashes. Cervical Nodes:  No significant cervical adenopathy. Psych:  Alert and cooperative. Normal mood and affect.  Impression & Recommendations:  Problem # 1:  HEMATOCHEZIA (ICD-578.1)  Intermittent brbpr likely due to  benign anorectal source, however, patient with FH of CRC in mother at young age. Colonoscopy to be performed in near future.  Risks, alternatives, and benefits including but not limited to the risk of reaction to medication, bleeding, infection, and perforation were  addressed.  Patient voiced understanding and provided verbal consent.   All of patients siblings should start CRC screening at age 58.  Orders: Consultation Level III ([address]) I would like to thank Dr. Lodema Hong for allowing [place] to take part in the care of this nice patient.

## 2010-05-29 NOTE — Letter (Signed)
Summary: RX ASSISTANCE  RX ASSISTANCE   Imported By: Lind Guest 06/02/2009 10:01:25  _____________________________________________________________________  External Attachment:    Type:   Image     Comment:   External Document

## 2010-05-29 NOTE — Letter (Signed)
Summary: misc.  misc.   Imported By: Curtis Sites 10/13/2009 13:03:41  _____________________________________________________________________  External Attachment:    Type:   Image     Comment:   External Document

## 2010-05-29 NOTE — Assessment & Plan Note (Signed)
Summary: office visit   Vital Signs:  Patient profile:   30 year old female Menstrual status:  regular Height:      63 inches Weight:      301.75 pounds BMI:     53.65 O2 Sat:      98 % Temp:     98.8 degrees F oral Pulse rate:   98 / minute Resp:     16 per minute BP sitting:   118 / 84  (left arm) Cuff size:   xl  Vitals Entered By: Everitt Amber LPN (February 06, 2010 1:48 PM)  Nutrition Counseling: Patient's BMI is greater than 25 and therefore counseled on weight management options. CC: body aches, chills, and sore throat, woke up saturday and felt terrible. Very fatigued, coughed up some greenish phlegm and some blood.    Referring Provider:  Lodema Hong Primary Provider:  Lodema Hong  CC:  body aches, chills, and sore throat, woke up saturday and felt terrible. Very fatigued, and coughed up some greenish phlegm and some blood. Marland Kitchen  History of Present Illness: Pt reports 5 day hx of body aches, fatigue, nasal congestion, and sore throat.  Symptoms wax and wane.  "Feels like sandpaper in my throat."  + cough, sometimes productive. Mucus sometimes discolored.  Has tried over the counter cold products.  Quit smoking March 2011.    Current Medications (verified): 1)  Tri-Sprintec 0.035 Mg Tabs (Norgestimate-Ethinyl Estradiol) .... Take 1 Tablet By Mouth Once A Day 2)  Claritin 10 Mg Tabs (Loratadine) .... One By Mouth Daily  Allergies (verified): No Known Drug Allergies  Past History:  Past medical history reviewed for relevance to current acute and chronic problems.  Past Medical History: Reviewed history from 03/02/2008 and no changes required. NICOTINE ADDICTION (ICD-305.1) OBESITY (ICD-278.00) ARTHRITIS, LEFT KNEE (ICD-716.96) Facial acne migraine headaches  Review of Systems General:  Denies chills and fever. ENT:  Complains of nasal congestion, postnasal drainage, and sore throat; denies earache and sinus pressure. CV:  Denies chest pain or discomfort and  palpitations. Resp:  Complains of coughing up blood and sputum productive; denies cough, shortness of breath, and wheezing.  Physical Exam  General:  Well-developed,well-nourished,in no acute distress; alert,appropriate and cooperative throughout examination Head:  Normocephalic and atraumatic without obvious abnormalities. No apparent alopecia or balding. Ears:  External ear exam shows no significant lesions or deformities.  Otoscopic examination reveals clear canals, tympanic membranes are intact bilaterally without bulging, retraction, inflammation or discharge. Hearing is grossly normal bilaterally. Nose:  no external deformity and mucosal edema.   Mouth:  Oral mucosa and oropharynx without lesions or exudates.  Teeth in good repair. Neck:  No deformities, masses, or tenderness noted. Lungs:  Normal respiratory effort, chest expands symmetrically. Lungs are clear to auscultation, no crackles or wheezes. Heart:  Normal rate and regular rhythm. S1 and S2 normal without gallop, murmur, click, rub or other extra sounds. Cervical Nodes:  No lymphadenopathy noted Psych:  Cognition and judgment appear intact. Alert and cooperative with normal attention span and concentration. No apparent delusions, illusions, hallucinations   Impression & Recommendations:  Problem # 1:  SINUSITIS, ACUTE (ICD-461.9) Assessment New  Her updated medication list for this problem includes:    Amoxicillin 875 Mg Tabs (Amoxicillin) .Marland Kitchen... Take 1 two times a day x 10 days  Problem # 2:  OBESITY (ICD-278.00) Assessment: Comment Only Discussed healthy diet, increased activity and encouraged wt loss.  Complete Medication List: 1)  Tri-sprintec 0.035 Mg Tabs (Norgestimate-ethinyl estradiol) .... Take  1 tablet by mouth once a day 2)  Claritin 10 Mg Tabs (Loratadine) .... One by mouth daily 3)  Amoxicillin 875 Mg Tabs (Amoxicillin) .... Take 1 two times a day x 10 days 4)  Fluconazole 150 Mg Tabs (Fluconazole) ....  Take 1 by mouth for yeast infection  Patient Instructions: 1)  Please schedule a follow-up appointment in 4 months for a physical. 2)  I have prescribed an antibiotic for you. 3)  Increase fluids.  Use an over the counter cold medicine as needed for congestion. 4)  Congratulations on quitting smoking! 5)  It is important that you exercise regularly at least 20 minutes 5 times a week. If you develop chest pain, have severe difficulty breathing, or feel very tired , stop exercising immediately and seek medical attention. 6)  You need to lose weight. Consider a lower calorie diet and regular exercise.  Prescriptions: FLUCONAZOLE 150 MG TABS (FLUCONAZOLE) take 1 by mouth for yeast infection  #1 x 0   Entered and Authorized by:   Esperanza Sheets PA   Signed by:   Esperanza Sheets PA on 02/06/2010   Method used:   Electronically to        [facility]  [address] (retail)       [address]       [place], [place]  [address]       Ph: [phone]       Fax: [phone]   RxID:   [id number] AMOXICILLIN 875 MG TABS (AMOXICILLIN) take 1 two times a day x 10 days  #20 x 0   Entered and Authorized by:   Esperanza Sheets PA   Signed by:   Esperanza Sheets PA on 02/06/2010   Method used:   Electronically to        [facility]  [address] (retail)       [address]       [place], [place]  [address]       Ph: [phone]       Fax: [phone]   RxID:   [id number]

## 2010-05-29 NOTE — Letter (Signed)
Summary: med review sheet  med review sheet   Imported By: Rudene Anda 02/06/2010 15:59:19  _____________________________________________________________________  External Attachment:    Type:   Image     Comment:   External Document

## 2010-05-29 NOTE — Letter (Signed)
Summary: TB Skin Test  [facility]  [address]   [place], [place] [address]   Phone: [phone]  Fax: [phone]          TB Skin Test    Claire [name] DOB: Aug 18, 1980   Vaccine Type: PPD    Site: left forearm    Mfr: Sanofi Pasteur    Dose: 0.1 ml    Route: ID    Given by: Adella Hare LPN    Exp. Date: 02/09/2011    Lot #: [id number]  Read: 0mm, negative Jaime Boothe LPN  Sep 15, 2009 11:18 AM

## 2010-05-29 NOTE — Letter (Signed)
Summary: TRANSFERRED RECORDS  TRANSFERRED RECORDS   Imported By: Lind Guest 01/23/2010 13:26:16  _____________________________________________________________________  External Attachment:    Type:   Image     Comment:   External Document

## 2010-05-29 NOTE — Letter (Signed)
Summary: phone notes  phone notes   Imported By: Curtis Sites 10/13/2009 13:04:19  _____________________________________________________________________  External Attachment:    Type:   Image     Comment:   External Document

## 2010-05-29 NOTE — Progress Notes (Signed)
  Phone Note Call from Patient   Summary of Call: generic nose spray is still $47. cant afford this Initial call taken by: Adella Hare LPN,  October 24, 2009 11:35 AM  Follow-up for Phone Call        We dont have any samples or coupons either, I checked.  She will just have to try the Loratadine for now.  Follow-up by: Esperanza Sheets PA,  October 24, 2009 11:43 AM  Additional Follow-up for Phone Call Additional follow up Details #1::        patient aware Additional Follow-up by: Adella Hare LPN,  October 24, 2009 1:08 PM

## 2010-05-29 NOTE — Progress Notes (Signed)
Summary: MEDICINE  Phone Note Call from Patient   Summary of Call: CALLED AND WANTS TO KNOW HOW LONG SHOULD IT TAKE FOR THE YEAST  INFECTION MEDICINE CALL HER BACK AT HOME Initial call taken by: Lind Guest,  February 21, 2010 1:16 PM  Follow-up for Phone Call        this was from [place], may i send refill? Follow-up by: Adella Hare LPN,  February 22, 2010 10:21 AM  Additional Follow-up for Phone Call Additional follow up Details #1::        pls call pharmacy and call in the fluconazole that you sent in since 10/24, after you spk withthe pharmacist let pt know , then sign off pls Additional Follow-up by: Syliva Overman MD,  February 22, 2010 1:14 PM    Additional Follow-up for Phone Call Additional follow up Details #2::    Called pharmacy and ordered prescription. Called patient, no answer and no voicemail is set up. Follow-up by: Mauricia Area CMA,  February 22, 2010 1:47 PM  Additional Follow-up for Phone Call Additional follow up Details #3:: Details for Additional Follow-up Action Taken: Patient aware Additional Follow-up by: Mauricia Area CMA,  February 22, 2010 1:57 PM

## 2010-05-29 NOTE — Letter (Signed)
Summary: rx assistance  rx assistance   Imported By: Lind Guest 05/19/2009 10:09:35  _____________________________________________________________________  External Attachment:    Type:   Image     Comment:   External Document

## 2010-05-29 NOTE — Letter (Signed)
Summary: PATIENT ASSISTANCE  PATIENT ASSISTANCE   Imported By: Lind Guest 06/23/2009 10:23:56  _____________________________________________________________________  External Attachment:    Type:   Image     Comment:   External Document

## 2010-05-29 NOTE — Letter (Signed)
Summary: xray  xray   Imported By: Curtis Sites 10/13/2009 13:04:54  _____________________________________________________________________  External Attachment:    Type:   Image     Comment:   External Document

## 2010-05-29 NOTE — Assessment & Plan Note (Signed)
Summary: office visit   Vital Signs:  Patient profile:   30 year old female Menstrual status:  regular Height:      63 inches Weight:      286.25 pounds BMI:     50.89 O2 Sat:      98 % Pulse rate:   91 / minute Resp:     16 per minute BP sitting:   120 / 82 Cuff size:   xl  Vitals Entered By: Everitt Amber (May 17, 2009 4:24 PM) CC: Has been having really sharp pains through right wrist into her thumb, started yesterday but when she woke up this morning she could hardly move it. Is Patient Diabetic? No   Primary Care Provider:  Maico Mulvehill  CC:  Has been having really sharp pains through right wrist into her thumb and started yesterday but when she woke up this morning she could hardly move it.Marland Kitchen  History of Present Illness: Right  thumb pain since yesterday after rotating it excessively sh heard a pop, tenderness ensued with limited movement, has improved throughthe day, but duifficult to [place] still. She is still smooking excessively and has not cut bak on cigarettes.  Allergies: No Known Drug Allergies  Review of Systems      See HPI General:  Denies chills and fever. Eyes:  Denies blurring and discharge. ENT:  Denies hoarseness, nasal congestion, sinus pressure, and sore throat. CV:  Denies chest pain or discomfort, palpitations, and swelling of feet. Resp:  Denies cough and sputum productive. GI:  Denies abdominal pain, constipation, diarrhea, nausea, and vomiting. GU:  Denies dysuria and urinary frequency. MS:  Complains of joint pain; acute right thumb pain.  Physical Exam  General:  Well-developedobese,in no acute distress; alert,appropriate and cooperative throughout examination. Pt in pain. HEENT: No facial asymmetry,  EOMI, No sinus tenderness, TM's Clear, oropharynx  pink and moist. erythema and edem of nasal mucosa  Chest: Clear to auscultation bilaterally.  CVS: S1, S2, No murmurs, No S3.   Abd: Soft, Nontender.  MS: Adequate ROM spine, hips,  shoulders and knees. decreaased OM right thumb with tenderness on movement and limitation Ext: No edema.   CNS: CN 2-12 intact, power tone and sensation normal throughout.   Skin: Intact, no visible lesions or rashes.  Psych: Good eye contact, normal affect.  Memory intact, not anxious or depressed appearing.$    Impression & Recommendations:  Problem # 1:  THUMB PAIN, RIGHT (ICD-729.5) Assessment Deteriorated  Orders: Depo- Medrol 80mg  (J1040) Ketorolac-Toradol 15mg  (V4098) Admin of Therapeutic Inj  intramuscular or subcutaneous (11914)  Problem # 2:  OTHER ACNE (ICD-706.1) Assessment: Improved  The following medications were removed from the medication list:    Septra Ds 800-160 Mg Tabs (Sulfamethoxazole-trimethoprim) .Marland Kitchen... Take 1 tablet by mouth two times a day    Ciprofloxacin Hcl 500 Mg Tabs (Ciprofloxacin hcl) .Marland Kitchen... Take 1 tablet by mouth two times a day Her updated medication list for this problem includes:    Benzaclin 1-5 % Gel (Clindamycin phos-benzoyl perox) .Marland Kitchen... Apply topically qhs    Doxycycline Hyclate 100 Mg Caps (Doxycycline hyclate) ..... Oen tab by mouth qd    Septra Ds 800-160 Mg Tabs (Sulfamethoxazole-trimethoprim) .Marland Kitchen... Take 1 tablet by mouth two times a day    Doxycycline Hyclate 100 Mg Tabs (Doxycycline hyclate) .Marland Kitchen... Take 1 tablet by mouth two times a day  Problem # 3:  OBESITY (ICD-278.00) Assessment: Deteriorated  Ht: 63 (05/17/2009)   Wt: 286.25 (05/17/2009)   BMI: 50.89 (  05/17/2009)  Problem # 4:  NICOTINE ADDICTION (ICD-305.1) Assessment: Unchanged  Encouraged smoking cessation and discussed different methods for smoking cessation.   Complete Medication List: 1)  Tri-sprintec 0.035 Mg Tabs (Norgestimate-ethinyl estradiol) .... Take 1 tablet by mouth once a day 2)  Topamax 25 Mg Tabs (Topiramate) .... One tab by mouth bid 3)  Imitrex 100 Mg Tabs (Sumatriptan succinate) .... One tab by mouth at headache onset rept after2 hours if needed max twice  weekly 4)  Benzaclin 1-5 % Gel (Clindamycin phos-benzoyl perox) .... Apply topically qhs 5)  Ortho Micronor 0.35 Mg Tabs (Norethindrone (contraceptive)) .... Uad 6)  Doxycycline Hyclate 100 Mg Caps (Doxycycline hyclate) .... Oen tab by mouth qd 7)  Ibuprofen 800 Mg Tabs (Ibuprofen) .... One tab by mouth tid 8)  Septra Ds 800-160 Mg Tabs (Sulfamethoxazole-trimethoprim) .... Take 1 tablet by mouth two times a day 9)  Fluconazole 150 Mg Tabs (Fluconazole) .... Take one tab by mouth 10)  Topamax 25 Mg Tabs (Topiramate) .... Take 1 tablet by mouth two times a day 11)  Imitrex 100 Mg Tabs (Sumatriptan succinate) .... Take one tab at start of h/a repeat after 2 hours if needed 12)  Fioricet 50-325-40 Mg Tabs (Butalbital-apap-caffeine) .... Take 1 tablet by mouth two times a day 13)  Ambien Cr 12.5 Mg Cr-tabs (Zolpidem tartrate) .... Take 1 tab by mouth at bedtime 14)  Doxycycline Hyclate 100 Mg Tabs (Doxycycline hyclate) .... Take 1 tablet by mouth two times a day 15)  Ibuprofen 800 Mg Tabs (Ibuprofen) .... One tab by mouth three times a day  Patient Instructions: 1)  CPE pls sched 2)  You have tendonitis of the right thumb. 3)  You will get injections for this and meds as well. Prescriptions: PREDNISONE (PAK) 5 MG TABS (PREDNISONE) uad  #21 x 0   Entered by:   Worthy Keeler LPN   Authorized by:   Syliva Overman MD   Signed by:   Syliva Overman MD on 05/27/2009   Method used:   Handwritten   RxID:   [id number] IBUPROFEN 800 MG TABS (IBUPROFEN) one tab by mouth three times a day  #30 x 0   Entered by:   Worthy Keeler LPN   Authorized by:   Syliva Overman MD   Signed by:   Syliva Overman MD on 05/27/2009   Method used:   Handwritten   RxID:   [id number]    Medication Administration  Injection # 1:    Medication: Depo- Medrol 80mg     Diagnosis: THUMB PAIN, RIGHT (ICD-729.5)    Route: IM    Site: RUOQ gluteus    Exp Date: 7/12    Lot #: [id number]    Mfr: [employer]     Patient tolerated injection without complications    Given by: Worthy Keeler LPN (May 18, 2009 2:03 PM)  Injection # 2:    Medication: Ketorolac-Toradol 15mg     Diagnosis: THUMB PAIN, RIGHT (ICD-729.5)    Route: IM    Site: LUOQ gluteus    Exp Date: 11/28/2010    Lot #: [id number]    Mfr: [email or web address]    Comments: toradol 60mg  given    Patient tolerated injection without complications    Given by: Worthy Keeler LPN (May 18, 2009 2:04 PM)  Orders Added: 1)  Est. Patient Level III [52841] 2)  Depo- Medrol 80mg  [J1040] 3)  Ketorolac-Toradol 15mg  [J1885] 4)  Admin of Therapeutic Inj  intramuscular or subcutaneous [32440]

## 2010-05-29 NOTE — Letter (Signed)
Summary: insurance card  insurance card   Imported By: Rudene Anda 02/06/2010 13:41:27  _____________________________________________________________________  External Attachment:    Type:   Image     Comment:   External Document

## 2010-05-29 NOTE — Letter (Signed)
Summary: consult  consult   Imported By: Curtis Sites 10/13/2009 13:02:35  _____________________________________________________________________  External Attachment:    Type:   Image     Comment:   External Document

## 2010-05-29 NOTE — Letter (Signed)
Summary: demographic  demographic   Imported By: Curtis Sites 10/13/2009 13:02:53  _____________________________________________________________________  External Attachment:    Type:   Image     Comment:   External Document

## 2010-05-29 NOTE — Assessment & Plan Note (Signed)
Summary: tb  Nurse Visit   Allergies: No Known Drug Allergies  Immunizations Administered:  PPD Skin Test:    Vaccine Type: PPD    Site: left forearm    Mfr: Sanofi Pasteur    Dose: 0.1 ml    Route: ID    Given by: Adella Hare LPN    Exp. Date: 02/09/2011    Lot #: [id number]  Orders Added: 1)  TB Skin Test [86580] 2)  Admin 1st Vaccine [90471]  Appended Document: tb   PPD Results    Date of reading: 09/15/2009    Results: < 5mm    Interpretation: negative

## 2010-05-29 NOTE — Letter (Signed)
Summary: history and physical  history and physical   Imported By: Curtis Sites 10/13/2009 13:03:10  _____________________________________________________________________  External Attachment:    Type:   Image     Comment:   External Document

## 2010-05-29 NOTE — Assessment & Plan Note (Signed)
Summary: office visit   Vital Signs:  Patient profile:   30 year old female Menstrual status:  regular Height:      63 inches Weight:      276.25 pounds BMI:     49.11 O2 Sat:      97 % Pulse rate:   71 / minute Pulse rhythm:   regular Resp:     16 per minute BP sitting:   118 / 78  (left arm)  Vitals Entered By: Everitt Amber LPN (June 14, 2009 2:24 PM)  Nutrition Counseling: Patient's BMI is greater than 25 and therefore counseled on weight management options. CC: CPE Is Patient Diabetic? No Pain Assessment Patient in pain? no       Vision Screening:Left eye w/o correction: 20 / 20 Right Eye w/o correction: 20 / 20 Both eyes w/o correction:  20/ 20  Color vision testing: normal      Vision Entered By: Everitt Amber LPN (June 14, 2009 2:28 PM)   Primary Care Provider:  Shayla Heming  CC:  CPE.  History of Present Illness: Reports  that she has been doing well. Denies recent fever or chills. Denies sinus pressure, nasal congestion , ear pain or sore throat. Denies chest congestion, or cough productive of sputum. Denies chest pain, palpitations, PND, orthopnea or leg swelling. Denies abdominal pain, nausea, vomitting, diarrhea or constipation. Denies change in bowel movements or bloody stool. Denies dysuria , frequency, incontinence or hesitancy. Denies  joint pain, swelling, or reduced mobility. Denies headaches, vertigo, seizures.states she no longer needs topmax or immitrex. Denies depression, anxiety or insomnia. Denies  rash, lesions, or itch.Intolerant of benzaclin, does not want it made her itch She has lost 10 pounds in the past 6 weeks with lfestyle change and is rughtfully proud of this. Her nicotine use is unchanged.     Preventive Screening-Counseling & Management  Alcohol-Tobacco     Smoking Cessation Counseling: yes  Current Medications (verified): 1)  Tri-Sprintec 0.035 Mg Tabs (Norgestimate-Ethinyl Estradiol) .... Take 1 Tablet  By Mouth Once A Day 2)  Ibuprofen 800 Mg Tabs (Ibuprofen) .... One Tab By Mouth Three Times A Day  Allergies (verified): No Known Drug Allergies  Review of Systems      See HPI Eyes:  Denies blurring and discharge. Heme:  Denies abnormal bruising and bleeding. Allergy:  Denies hives or rash and sneezing.  Physical Exam  General:  Well-developed,obese,in no acute distress; alert,appropriate and cooperative throughout examination Head:  Normocephalic and atraumatic without obvious abnormalities. No apparent alopecia or balding. Eyes:  No corneal or conjunctival inflammation noted. EOMI. Perrla. Funduscopic exam benign, without hemorrhages, exudates or papilledema. Vision grossly normal. Ears:  External ear exam shows no significant lesions or deformities.  Otoscopic examination reveals clear canals, tympanic membranes are intact bilaterally without bulging, retraction, inflammation or discharge. Hearing is grossly normal bilaterally. Nose:  External nasal examination shows no deformity or inflammation. Nasal mucosa are pink and moist without lesions or exudates. Mouth:  Oral mucosa and oropharynx without lesions or exudates.  Teeth in good repair. Neck:  No deformities, masses, or tenderness noted. Chest Wall:  No deformities, masses, or tenderness noted. Breasts:  No mass, nodules, thickening, tenderness, bulging, retraction, inflamation, nipple discharge or skin changes noted.   Lungs:  Normal respiratory effort, chest expands symmetrically. Lungs are clear to auscultation, no crackles or wheezes. Heart:  Normal rate and regular rhythm. S1 and S2 normal without gallop, murmur, click, rub or other extra sounds. Abdomen:  Bowel sounds positive,abdomen soft and non-tender without masses, organomegaly or hernias noted. Genitalia:  Normal introitus for age, no external lesions, no vaginal discharge, mucosa pink and moist, no vaginal or cervical lesions, no vaginal atrophy, no friaility or  hemorrhage, normal uterus size and position, no adnexal masses or tenderness Msk:  No deformity or scoliosis noted of thoracic or lumbar spine.   Pulses:  R and L carotid,radial,femoral,dorsalis pedis and posterior tibial pulses are full and equal bilaterally Extremities:  No clubbing, cyanosis, edema, or deformity noted with normal full range of motion of all joints.   Neurologic:  No cranial nerve deficits noted. Station and gait are normal. Plantar reflexes are down-going bilaterally. DTRs are symmetrical throughout. Sensory, motor and coordinative functions appear intact. Skin:  Intact mild facial acne Cervical Nodes:  No lymphadenopathy noted Axillary Nodes:  No palpable lymphadenopathy Inguinal Nodes:  No significant adenopathy Psych:  Cognition and judgment appear intact. Alert and cooperative with normal attention span and concentration. No apparent delusions, illusions, hallucinations   Impression & Recommendations:  Problem # 1:  ROUTINE GYNECOLOGICAL EXAMINATION (ICD-V72.31) Assessment Comment Only  Orders: Pap Smear (04540)  Problem # 2:  HEADACHE (ICD-784.0) Assessment: Improved  The following medications were removed from the medication list:    Imitrex 100 Mg Tabs (Sumatriptan succinate) ..... One tab by mouth at headache onset rept after2 hours if needed max twice weekly    Ibuprofen 800 Mg Tabs (Ibuprofen) ..... One tab by mouth tid    Imitrex 100 Mg Tabs (Sumatriptan succinate) .Marland Kitchen... Take one tab at start of h/a repeat after 2 hours if needed    Fioricet 50-325-40 Mg Tabs (Butalbital-apap-caffeine) .Marland Kitchen... Take 1 tablet by mouth two times a day Her updated medication list for this problem includes:    Ibuprofen 800 Mg Tabs (Ibuprofen) ..... One tab by mouth three times a day  Problem # 3:  NICOTINE ADDICTION (ICD-305.1) Assessment: Unchanged  Encouraged smoking cessation and discussed different methods for smoking cessation.   Problem # 4:  OBESITY  (ICD-278.00) Assessment: Improved  Ht: 63 (06/14/2009)   Wt: 276.25 (06/14/2009)   BMI: 49.11 (06/14/2009)  Problem # 5:  ALLERGIC RHINITIS CAUSE UNSPECIFIED (ICD-477.9) Assessment: Improved  Complete Medication List: 1)  Tri-sprintec 0.035 Mg Tabs (Norgestimate-ethinyl estradiol) .... Take 1 tablet by mouth once a day 2)  Ibuprofen 800 Mg Tabs (Ibuprofen) .... One tab by mouth three times a day 3)  Doxycycline Hyclate 100 Mg Caps (Doxycycline hyclate) .... Take 1 capsule by mouth two times a day 4)  Fluconazole 150 Mg Tabs (Fluconazole) .... Take 1 tablet by mouth once a day as needed  Patient Instructions: 1)  f/u in 5.5 months. 2)  congrats on weight loss, pls keep itup. 3)  Tobacco is very bad for your health and your loved ones! You Should stop smoking!. 4)  Stop Smoking Tips: Choose a Quit date. Cut down before the Quit date. decide what you will do as a substitute when you feel the urge to smoke(gum,toothpick,exercise). 5)  Med is sent in for the skin lesion on your right inner thigh,. 6)  It is important that you exercise regularly at least 40 minutes 5 times a week. If you develop chest pain, have severe difficulty breathing, or feel very tired , stop exercising immediately and seek medical attention. 7)  You need to lose weight. Consider a lower calorie diet and regular exercise.  Prescriptions: TRI-SPRINTEC 0.035 MG TABS (NORGESTIMATE-ETHINYL ESTRADIOL) Take 1 tablet by mouth once a  day  #1 cycle x 11   Entered by:   Everitt Amber LPN   Authorized by:   Syliva Overman MD   Signed by:   Everitt Amber LPN on 40/98/1191   Method used:   Electronically to        [facility]  [address] (retail)       [address]       [place], [place]  [address]       Ph: [phone]       Fax: [phone]   RxID:   [id number] FLUCONAZOLE 150 MG TABS (FLUCONAZOLE) Take 1 tablet by mouth once a day as needed  #1 x 2   Entered and Authorized by:   Syliva Overman MD    Signed by:   Syliva Overman MD on 06/14/2009   Method used:   Electronically to        [facility]  [address] [facility] (retail)       [address]       [place], [place]  [address]       Ph: [phone]       Fax: [phone]   RxID:   [id number] DOXYCYCLINE HYCLATE 100 MG CAPS (DOXYCYCLINE HYCLATE) Take 1 capsule by mouth two times a day  #20 x 0   Entered and Authorized by:   Syliva Overman MD   Signed by:   Syliva Overman MD on 06/14/2009   Method used:   Electronically to        [facility]  [address] (retail)       [address]       [place], [place]  [address]       Ph: [phone]       Fax: [phone]   RxID:   [id number]

## 2010-05-29 NOTE — Assessment & Plan Note (Signed)
Summary: OV   Vital Signs:  Patient profile:   30 year old female Menstrual status:  regular Height:      63 inches Weight:      289 pounds BMI:     51.38 O2 Sat:      98 % Pulse rate:   87 / minute Pulse rhythm:   regular Resp:     16 per minute BP sitting:   114 / 78  (left arm) Cuff size:   xl   Vitals Entered By: Everitt Amber LPN (October 04, 6576 9:34 AM)  Nutrition Counseling: Patient's BMI is greater than 25 and therefore counseled on weight management options.  CC: Follow up chronic problems   Primary Care Provider:  Deserai Cansler  CC:  Follow up chronic problems.  History of Present Illness: 2 episodes of painless BBRB, stained water in the commode, no significant straining associated wit it. Experiences nausea with the bleeding. Nipples have been hard and snsitive in the past week,L MP was in mid May., Pt is on her reg birth control pills, no nipple d/c or rash noted. Ncotine quit in March Shelia is unfortunately contiuing to gain weight, her ability to follow a reduced carb diet is seriously challengeds. 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  Current Medications (verified): 1)  Tri-Sprintec 0.035 Mg Tabs (Norgestimate-Ethinyl Estradiol) .... Take 1 Tablet By Mouth Once A Day  Allergies (verified): No Known Drug Allergies  Social History: UNEMPLOYED Single quit nicotine in March 2011 Alcohol use-no Drug use-no  Review of Systems      See HPI Eyes:  Denies blurring and discharge. Derm:  Complains of lesion(s) and rash; dry skin lesions on both hands intermittently which start as blisters over the past 2 weeks. Endo:  Denies cold intolerance, excessive hunger, excessive thirst, excessive urination, heat intolerance, polyuria, and weight change. Heme:  Denies abnormal bruising and bleeding. Allergy:  Denies hives or rash and itching eyes.  Physical Exam  General:  Well-developed,obese,in no acute distress;  alert,appropriate and cooperative throughout examination HEENT: No facial asymmetry,  EOMI, No sinus tenderness, TM's Clear, oropharynx  pink and moist.   Chest: Clear to auscultation bilaterally.  CVS: S1, S2, No murmurs, No S3.   Abd: Soft, Nontender.  MS: Adequate ROM spine, hips, shoulders and knees.  Ext: No edema.   CNS: CN 2-12 intact, power tone and sensation normal throughout.   Skin: Intact, no visible lesions or rashes.  Psych: Good eye contact, normal affect.  Memory intact, not anxious or depressed appearing. breasts: no mases, tenderness or nipple d/c , no axill;ary nodes.    Impression & Recommendations:  Problem # 1:  SPECIAL SCREENING FOR MALIGNANT NEOPLASMS COLON (ICD-V76.51) Assessment Comment Only  Orders: Hemoccult Guaiac-1 spec.(in office) (46962) mother dx with colon ca under age 55, though pt has obvious hemmoprhoids, interna;l, sh is being referred to GI for further eval based on fam hx.She is advised to use otc preph for hemmorhoids and refrain from straining at stool, regular softener if needed, high fiber diet with adequate water.  Problem # 2:  MASTALGIA (ICD-611.71) Assessment: Comment Only supportive bras, and use of cool compress and anti-inflammatories advised also reduced caffeine , has cuysic breasts c/w fibrocystic ds. Nipple symptoms likely hormonal  Problem # 3:  INTERNAL HEMORRHOIDS WITH OTHER COMPLICATION (ICD-455.2) Assessment: Comment Only  Orders: Gastroenterology Referral (GI), preparation  h twice daily for 5 days, colonscopy to be considered since mom has colon  ca  Problem # 4:  OBESITY (ICD-278.00) Assessment: Deteriorated  Ht: 63 (10/04/2009)   Wt: 289 (10/04/2009)   BMI: 51.38 (10/04/2009)  Complete Medication List: 1)  Tri-sprintec 0.035 Mg Tabs (Norgestimate-ethinyl estradiol) .... Take 1 tablet by mouth once a day  Other Orders: T-Basic Metabolic Panel [phone]) T-Lipid Profile [phone]) T-CBC w/Diff  [phone]) T-TSH [phone]) T-Vitamin D (25-Hydroxy) [phone])  Patient Instructions: 1)  Please schedule a follow-up appointment in 3 months. 2)  It is important that you exercise regularly at least 20 minutes 5 times a week. If you develop chest pain, have severe difficulty breathing, or feel very tired , stop exercising immediately and seek medical attention. 3)  You need to lose weight. Consider a lower calorie diet and regular exercise.  4)  CONGRATS on quitting smoking. 5)  Congrats on graduating. 6)  All the best. 7)  Take antiinflammatory , wear supportive  braziers , and stay off of caffeine pls. 8)  Plss use preparation H inside the rectum for hemmoroids twice daily fr 5 days to reduce re-bleed. 9)  You will be referred to GI also, because of your family history 10)  You will be referred to gI re rectal bleeding. 11)  use hydrocortisone cream teice daily for 5to 7 daysto the rashes on your hands pls  12)  BMP prior to visit, ICD-9: 13)  Lipid Panel prior to visit, ICD-9: 14)  TSH prior to visit, ICD-9:  fasting asap 15)  CBC w/ Diff prior to visit, ICD-9: 16)  Vit d  Laboratory Results    Stool - Occult Blood Hemmoccult #1: negative Date: 10/11/2009 Comments: [id number] 9r 8/11  118 1012

## 2010-05-29 NOTE — Progress Notes (Signed)
Summary: rx  Phone Note Call from Patient   Summary of Call: she stills feel worse wants to know about some tamuflu [facility] call [phone] or [phone] Initial call taken by: Lind Guest,  May 30, 2009 4:46 PM  Follow-up for Phone Call        advise and erx tamifly 75mg  one twice daily x 5 days Follow-up by: Syliva Overman MD,  May 30, 2009 4:59 PM  Additional Follow-up for Phone Call Additional follow up Details #1::        left detailed message on patients personal voice mail rx sent Additional Follow-up by: Worthy Keeler LPN,  May 30, 2009 5:03 PM    New/Updated Medications: TAMIFLU 75 MG CAPS (OSELTAMIVIR PHOSPHATE) one cap by mouth two times a day Prescriptions: TAMIFLU 75 MG CAPS (OSELTAMIVIR PHOSPHATE) one cap by mouth two times a day  #10 x 0   Entered by:   Worthy Keeler LPN   Authorized by:   Syliva Overman MD   Signed by:   Worthy Keeler LPN on 19/14/7829   Method used:   Electronically to        [facility]  [address] (retail)       [address]       [place], [place]  [address]       Ph: [phone]       Fax: [phone]   RxID:   [id number]

## 2010-06-07 ENCOUNTER — Ambulatory Visit: Payer: Self-pay | Admitting: Family Medicine

## 2010-06-13 ENCOUNTER — Telehealth: Payer: Self-pay | Admitting: Family Medicine

## 2010-06-20 NOTE — Progress Notes (Signed)
Summary: bleeding with intercourse  Phone Note Call from Patient   Summary of Call: pt states after intercourse she was bleeding and was wondering if this was normal are not. [phone] Initial call taken by: Rudene Anda,  June 13, 2010 8:51 AM  Follow-up for Phone Call        not normal, unless there was excessive trauma involved, if she has dischg associted with it then will need specimens sent. Follow-up by: Syliva Overman MD,  June 13, 2010 9:22 AM  Additional Follow-up for Phone Call Additional follow up Details #1::        bleeding went away, no discharge, has physical next month Additional Follow-up by: Adella Hare LPN,  June 13, 2010 10:15 AM

## 2010-06-26 ENCOUNTER — Ambulatory Visit (INDEPENDENT_AMBULATORY_CARE_PROVIDER_SITE_OTHER): Payer: 59 | Admitting: Family Medicine

## 2010-06-26 ENCOUNTER — Encounter: Payer: Self-pay | Admitting: Family Medicine

## 2010-06-26 DIAGNOSIS — J019 Acute sinusitis, unspecified: Secondary | ICD-10-CM

## 2010-06-26 DIAGNOSIS — R7301 Impaired fasting glucose: Secondary | ICD-10-CM | POA: Insufficient documentation

## 2010-06-26 DIAGNOSIS — J209 Acute bronchitis, unspecified: Secondary | ICD-10-CM

## 2010-07-05 ENCOUNTER — Encounter: Payer: Self-pay | Admitting: Family Medicine

## 2010-07-05 ENCOUNTER — Other Ambulatory Visit: Payer: Self-pay | Admitting: Family Medicine

## 2010-07-05 ENCOUNTER — Encounter (INDEPENDENT_AMBULATORY_CARE_PROVIDER_SITE_OTHER): Payer: Self-pay | Admitting: Family Medicine

## 2010-07-05 ENCOUNTER — Other Ambulatory Visit (HOSPITAL_COMMUNITY)
Admission: RE | Admit: 2010-07-05 | Discharge: 2010-07-05 | Disposition: A | Discharge status: Home / Self Care | Payer: 59 | Source: Ambulatory Visit | Attending: Family Medicine | Admitting: Family Medicine

## 2010-07-05 DIAGNOSIS — Z0000 Encounter for general adult medical examination without abnormal findings: Secondary | ICD-10-CM

## 2010-07-05 DIAGNOSIS — G4730 Sleep apnea, unspecified: Secondary | ICD-10-CM | POA: Insufficient documentation

## 2010-07-05 DIAGNOSIS — Z01419 Encounter for gynecological examination (general) (routine) without abnormal findings: Secondary | ICD-10-CM | POA: Insufficient documentation

## 2010-07-05 DIAGNOSIS — Z124 Encounter for screening for malignant neoplasm of cervix: Secondary | ICD-10-CM

## 2010-07-05 NOTE — Letter (Signed)
Summary: Out of Work  [facility]  [address]   [place], [place] [address]   Phone: [phone]  Fax: [phone]    June 26, 2010   Employee:  JAILEE JAQUEZ    To Whom It May Concern:   For Medical reasons, please excuse the above named employee from work for the following dates:  Start:   06/26/10  End:   06/28/10 to return with no restrictions   If you need additional information, please feel free to contact our office.         Sincerely,    Milus Mallick. Lodema Hong, MD

## 2010-07-05 NOTE — Letter (Signed)
Summary: Out of [facility]  [address]   [place], [place] [address]   Phone: [phone]  Fax: [phone]    June 26, 2010   Student:  Orlean Bradford Belland    To Whom It May Concern:   For Medical reasons, please excuse the above named student from school for the following dates:  Start:   June 26, 2010  End:    June 28, 2010  If you need additional information, please feel free to contact our office.   Sincerely,    Milus Mallick. Lodema Hong, MD    ****This is a legal document and cannot be tampered with.  Schools are authorized to verify all information and to do so accordingly.

## 2010-07-06 LAB — GC/CHLAMYDIA PROBE AMP, GENITAL
Chlamydia, DNA Probe: NEGATIVE
GC Probe Amp, Genital: NEGATIVE

## 2010-07-06 LAB — WET PREP BY MOLECULAR PROBE: Gardnerella vaginalis: POSITIVE — AB

## 2010-07-06 LAB — CONVERTED CEMR LAB
Chlamydia, DNA Probe: NEGATIVE
GC Probe Amp, Genital: NEGATIVE

## 2010-07-08 DIAGNOSIS — N760 Acute vaginitis: Secondary | ICD-10-CM | POA: Insufficient documentation

## 2010-07-10 ENCOUNTER — Encounter: Payer: Self-pay | Admitting: Family Medicine

## 2010-07-10 NOTE — Assessment & Plan Note (Signed)
Summary: sick   Vital Signs:  Patient profile:   30 year old female Menstrual status:  regular Height:      63 inches Weight:      300.25 pounds BMI:     53.38 O2 Sat:      98 % Pulse rate:   83 / minute Pulse rhythm:   regular Resp:     16 per minute BP sitting:   114 / 82  (left arm) Cuff size:   xl  Vitals Entered By: Everitt Amber LPN (June 26, 2010 10:24 AM)  Nutrition Counseling: Patient's BMI is greater than 25 and therefore counseled on weight management options. CC: nasal/chest congestion, cough, greem phlegm, tastes very bad x a few days   Primary Care Provider:  Lodema Hong  CC:  nasal/chest congestion, cough, greem phlegm, and tastes very bad x a few days.  History of Present Illness: 3 day h/o progressive head and chprior to this she had been well.hey are doing well. Denies recent fever or chills.  Denies chest pain, palpitations, PND, orthopnea or leg swelling. Denies abdominal pain, nausea, vomitting, diarrhea or constipation. Denies change in bowel movements or bloody stool. Denies dysuria , frequency, incontinence or hesitancy. Denies  joint pain, swelling, or reduced mobility. Denies headaches, vertigo, seizures. Denies depression, anxiety or insomnia. Denies  rash, lesions, or itch.     Current Medications (verified): 1)  Tri-Sprintec 0.035 Mg Tabs (Norgestimate-Ethinyl Estradiol) .... Take 1 Tablet By Mouth Once A Day 2)  Claritin 10 Mg Tabs (Loratadine) .... One By Mouth Daily  Allergies (verified): No Known Drug Allergies  Review of Systems      See HPI General:  Complains of chills, fatigue, malaise, sweats, and weakness; 3 day hisdtory. Eyes:  Denies discharge, eye pain, and red eye. ENT:  Complains of earache, hoarseness, nasal congestion, postnasal drainage, and sinus pressure; clear nasal drainage and scratchy throat, started 3 daysago painful left ear with bump since yesterday.Marland Kitchen Resp:  Complains of cough and sputum productive; 2 day  history of green sputum. Endo:  Denies cold intolerance, excessive hunger, excessive thirst, and excessive urination. Heme:  Denies abnormal bruising and bleeding. Allergy:  Denies hives or rash and itching eyes.  Physical Exam  General:  Well-developed,morbidly obese,in no acute distress; alert,appropriate and cooperative throughout examination HEENT: No facial asymmetry,  EOMI, frontal and maxillary sinus tenderness, TM's Clear, oropharynx  pink and moist. Erythema and edema of nasal mucosa  Chest: decreased air entry, scattered crackles, no wheezes CVS: S1, S2, No murmurs, No S3.   Abd: Soft, Nontender.  MS: Adequate ROM spine, hips, shoulders and knees.  Ext: No edema.   CNS: CN 2-12 intact, power tone and sensation normal throughout.   Skin: Intact, no visible lesions or rashes.  Psych: Good eye contact, normal affect.  Memory intact, not anxious or depressed appearing.    Impression & Recommendations:  Problem # 1:  ACUTE BRONCHITIS (ICD-466.0) Assessment Comment Only  Her updated medication list for this problem includes:    Septra Ds 800-160 Mg Tabs (Sulfamethoxazole-trimethoprim) .Marland Kitchen... Take 1 tablet by mouth two times a day    Tessalon Perles 100 Mg Caps (Benzonatate) .Marland Kitchen... Take 1 capsule by mouth three times a day  Orders: Rocephin  250mg  (F6213) Admin of Therapeutic Inj  intramuscular or subcutaneous (08657)  Problem # 2:  SINUSITIS, ACUTE (ICD-461.9) Assessment: Comment Only  Her updated medication list for this problem includes:    Septra Ds 800-160 Mg Tabs (Sulfamethoxazole-trimethoprim) .Marland Kitchen... Take 1  tablet by mouth two times a day    Tessalon Perles 100 Mg Caps (Benzonatate) .Marland Kitchen... Take 1 capsule by mouth three times a day  Problem # 3:  OBESITY (ICD-278.00) Assessment: Unchanged  Ht: 63 (06/26/2010)   Wt: 300.25 (06/26/2010)   BMI: 53.38 (06/26/2010)  Complete Medication List: 1)  Tri-sprintec 0.035 Mg Tabs (Norgestimate-ethinyl estradiol) .... Take 1  tablet by mouth once a day 2)  Claritin 10 Mg Tabs (Loratadine) .... One by mouth daily 3)  Septra Ds 800-160 Mg Tabs (Sulfamethoxazole-trimethoprim) .... Take 1 tablet by mouth two times a day 4)  Tessalon Perles 100 Mg Caps (Benzonatate) .... Take 1 capsule by mouth three times a day 5)  Fluconazole 150 Mg Tabs (Fluconazole) .... Take 1 tablet by mouth once a day as needed for vaginal itch  Other Orders: T- Hemoglobin A1C ([id number])  Patient Instructions: 1)  CPE as before . 2)  Congrats on smoking cessation!!! 3)  HBA1C today. 4)  You are being treate for acute sinusitis and bronchis. 5)  Rocephin 500mg  in the office today. 6)  Meds are sen in pls take he entire course Prescriptions: FLUCONAZOLE 150 MG TABS (FLUCONAZOLE) Take 1 tablet by mouth once a day as needed for vaginal itch  #3 x 0   Entered and Authorized by:   Syliva Overman MD   Signed by:   Syliva Overman MD on 06/26/2010   Method used:   Electronically to        [facility] [address] [facility] ([facility])       [address]       [place], [place]  [address]       Ph: [phone]       Fax: [phone]   RxID:   [id number] TESSALON PERLES 100 MG CAPS (BENZONATATE) Take 1 capsule by mouth three times a day  #30 x 0   Entered and Authorized by:   Syliva Overman MD   Signed by:   Syliva Overman MD on 06/26/2010   Method used:   Electronically to        [facility] [address] [facility] ([facility]ail)       [address]       [place], [place]  [address]       Ph: [phone]       Fax: [phone]   RxID:   [id number] SEPTRA DS 800-160 MG TABS (SULFAMETHOXAZOLE-TRIMETHOPRIM) Take 1 tablet by mouth two times a day  #20 x 0   Entered and Authorized by:   Syliva Overman MD   Signed by:   Syliva Overman MD on 06/26/2010   Method used:   Electronically to        [facility] [address] [facility]       [address]       [place], [place]  [address]       Ph: [phone]        Fax: [phone]   RxID:   [id number]    Medication Administration  Injection # 1:    Medication: Rocephin  250mg     Diagnosis: ACUTE BRONCHITIS (ICD-466.0)    Route: IM    Site: LUOQ gluteus    Exp Date: 08/14    Lot #: [id number]    Mfr: [email or web address]    Comments: rocephin 500mg  given    Patient tolerated  injection without complications    Given by: Adella Hare LPN (June 26, 2010 11:01 AM)  Orders Added: 1)  Est. Patient Level IV [04540] 2)  T- Hemoglobin A1C [83036-23375] 3)  Rocephin  250mg  [J0696] 4)  Admin of Therapeutic Inj  intramuscular or subcutaneous [96372]     Medication Administration  Injection # 1:    Medication: Rocephin  250mg     Diagnosis: ACUTE BRONCHITIS (ICD-466.0)    Route: IM    Site: LUOQ gluteus    Exp Date: 08/14    Lot #: [id number]    Mfr: [email or web address]    Comments: rocephin 500mg  given    Patient tolerated injection without complications    Given by: Adella Hare LPN (June 26, 2010 11:01 AM)  Orders Added: 1)  Est. Patient Level IV [[address]29] 2)  T- Hemoglobin A1C [83036-23375] 3)  Rocephin  250mg  [J0696] 4)  Admin of Therapeutic Inj  intramuscular or subcutaneous [[address]]

## 2010-07-17 NOTE — Letter (Signed)
Summary: Letter  Letter   Imported By: Lind Guest 07/10/2010 11:09:36  _____________________________________________________________________  External Attachment:    Type:   Image     Comment:   External Document

## 2010-07-17 NOTE — Assessment & Plan Note (Signed)
Summary: physical   Vital Signs:  Patient profile:   30 year old female Menstrual status:  regular Height:      63 inches Weight:      304.25 pounds BMI:     54.09 O2 Sat:      97 % Pulse rate:   80 / minute Pulse rhythm:   regular Resp:     16 per minute BP sitting:   104 / 82  (left arm) Cuff size:   xl  Vitals Entered By: Everitt Amber lpn  Nutrition Counseling: Patient's BMI is greater than 25 and therefore counseled on weight management options. CC: cpe  Vision Screening:Left eye with correction: 20 / 15 Right eye with correction: 20 / 15 Both eyes with correction: 20 / 15  Color vision testing: normal      Vision Entered By: Everitt Amber LPN (July 04, 1608 9:22 AM)   Primary Care Provider:  Lodema Hong  CC:  cpe.  History of Present Illness: Reports  thatshe is  doing well. Denies recent fever or chills. Denies sinus pressure, nasal congestion , ear pain or sore throat. Denies chest congestion, or cough productive of sputum. Denies chest pain, palpitations, PND, orthopnea or leg swelling. Denies abdominal pain, nausea, vomitting, diarrhea or constipation. Denies change in bowel movements or bloody stool. Denies dysuria , frequency, incontinence or hesitancy. Denies  joint pain, swelling, or reduced mobility. Denies headaches, vertigo, seizures. Denies depression, anxiety or insomnia. Denies  rash, lesions, or itch.     Current Medications (verified): 1)  Tri-Sprintec 0.035 Mg Tabs (Norgestimate-Ethinyl Estradiol) .... Take 1 Tablet By Mouth Once A Day 2)  Claritin 10 Mg Tabs (Loratadine) .... One By Mouth Daily 3)  Septra Ds 800-160 Mg Tabs (Sulfamethoxazole-Trimethoprim) .... Take 1 Tablet By Mouth Two Times A Day 4)  Tessalon Perles 100 Mg Caps (Benzonatate) .... Take 1 Capsule By Mouth Three Times A Day 5)  Fluconazole 150 Mg Tabs (Fluconazole) .... Take 1 Tablet By Mouth Once A Day As Needed For Vaginal Itch  Allergies (verified): No Known Drug  Allergies  Review of Systems      See HPI General:  Complains of fatigue and sleep disorder; excessive snoring and daytime sleepiness, wants to be checked for sleep apnea. Eyes:  Denies discharge, eye pain, and red eye. GU:  Complains of abnormal vaginal bleeding; post coital bleedinfg x 1 episode with most recent interciourse first time ever. Endo:  Denies cold intolerance, excessive hunger, excessive thirst, excessive urination, and heat intolerance. Heme:  Denies abnormal bruising, bleeding, and enlarge lymph nodes. Allergy:  Complains of seasonal allergies; denies hives or rash, itching eyes, and persistent infections.  Physical Exam  General:  Pleasant obese female,in no acute distress; alert,appropriate and cooperative throughout examination Head:  Normocephalic and atraumatic without obvious abnormalities. No apparent alopecia or balding.Neck obese and short Eyes:  No corneal or conjunctival inflammation noted. EOMI. Perrla. Funduscopic exam benign, without hemorrhages, exudates or papilledema. Vision grossly normal. Ears:  External ear exam shows no significant lesions or deformities.  Otoscopic examination reveals clear canals, tympanic membranes are intact bilaterally without bulging, retraction, inflammation or discharge. Hearing is grossly normal bilaterally. Nose:  External nasal examination shows no deformity or inflammation. Nasal mucosa are pink and moist without lesions or exudates. Mouth:  Oral mucosa and oropharynx without lesions or exudates.  Teeth in good repair. Neck:  No deformities, masses, or tenderness noted. Chest Wall:  No deformities, masses, or tenderness noted. Breasts:  No mass,  nodules, thickening, tenderness, bulging, retraction, inflamation, nipple discharge or skin changes noted.   Lungs:  Normal respiratory effort, chest expands symmetrically. Lungs are clear to auscultation, no crackles or wheezes. Heart:  Normal rate and regular rhythm. S1 and S2 normal  without gallop, murmur, click, rub or other extra sounds. Abdomen:  Bowel sounds positive,abdomen soft, obese, and non-tender without masses, organomegaly or hernias noted. Genitalia:  Normal introitus for age, no external lesions,fishyvaginal discharge, mucosa pink and moist, no vaginal or cervical lesions, no vaginal atrophy, no friaility or hemorrhage, normal uterus size and position, no adnexal masses or tenderness Msk:  No deformity or scoliosis noted of thoracic or lumbar spine.   Pulses:  R and L carotid,radial,femoral,dorsalis pedis and posterior tibial pulses are full and equal bilaterally Extremities:  No clubbing, cyanosis, edema, or deformity noted with normal full range of motion of all joints.   Neurologic:  No cranial nerve deficits noted. Station and gait are normal. Plantar reflexes are down-going bilaterally. DTRs are symmetrical throughout. Sensory, motor and coordinative functions appear intact. Skin:  Intact without suspicious lesions or rashes Cervical Nodes:  No lymphadenopathy noted Axillary Nodes:  No palpable lymphadenopathy Inguinal Nodes:  No significant adenopathy Psych:  Cognition and judgment appear intact. Alert and cooperative with normal attention span and concentration. No apparent delusions, illusions, hallucinations   Impression & Recommendations:  Problem # 1:  SLEEP APNEA (ICD-780.57) Assessment Comment Only  Orders: Sleep Disorder Referral (Sleep Disorder)  Problem # 2:  VULVOVAGINITIS (ICD-616.10) Assessment: Comment Only  Orders: T-Wet Prep by Molecular Probe [phone]) T-Chlamydia & GC Probe, Genital ([id number])  Complete Medication List: 1)  Tri-sprintec 0.035 Mg Tabs (Norgestimate-ethinyl estradiol) .... Take 1 tablet by mouth once a day 2)  Claritin 10 Mg Tabs (Loratadine) .... One by mouth daily 3)  Septra Ds 800-160 Mg Tabs (Sulfamethoxazole-trimethoprim) .... Take 1 tablet by mouth two times a day 4)  Tessalon Perles 100 Mg  Caps (Benzonatate) .... Take 1 capsule by mouth three times a day 5)  Fluconazole 150 Mg Tabs (Fluconazole) .... Take 1 tablet by mouth once a day as needed for vaginal itch  Other Orders: Pap Smear ([id number])  Patient Instructions: 1)  Please schedule a follow-up appointment in 4 months. 2)  It is important that you exercise regularly at least 20 minutes 5 times a week. If you develop chest pain, have severe difficulty breathing, or feel very tired , stop exercising immediately and seek medical attention. 3)  You need to lose weight. Consider a lower calorie diet and regular exercise. We will provide a 1500 calorie sheet. 4)  Pls start one mutivitamin daily, OTC is fine. 5)  You will be referred for a sleep study   Orders Added: 1)  Est. Patient 18-39 years [99395] 2)  Pap Smear [[id number]] 3)  T-Wet Prep by Molecular Probe [phone] 4)  T-Chlamydia & GC Probe, Genital [[id number]] 5)  Sleep Disorder Referral [Sleep Disorder]

## 2010-07-27 ENCOUNTER — Telehealth: Payer: Self-pay | Admitting: Family Medicine

## 2010-07-27 NOTE — Telephone Encounter (Signed)
Allergies acting up, throat sore, clear sinus drainage. Takes loratadine daily. Advised mucinex or sudafed and to call back next week if still bothering her

## 2010-07-30 ENCOUNTER — Other Ambulatory Visit: Payer: Self-pay

## 2010-07-30 ENCOUNTER — Telehealth: Payer: Self-pay | Admitting: Family Medicine

## 2010-07-30 MED ORDER — PREDNISONE (PAK) 5 MG PO TABS: 5.0000 mg | ORAL_TABLET | ORAL | Status: AC

## 2010-07-30 NOTE — Telephone Encounter (Signed)
Patient aware and med sent  

## 2010-07-30 NOTE — Telephone Encounter (Signed)
With no fever, chills , sputum, I recommend a prednisone 5mg  dose pack x 6 days, and twice daily sudafed pls send script in and let her know

## 2010-07-30 NOTE — Telephone Encounter (Signed)
She is still having the same symptoms as last week. Called Friday and I advised zyrtec and saline washes. Not helping. Drainage, running down throat making it sore. Head stopped up and sneezing. Please call her in prescription to [facility].

## 2010-08-21 ENCOUNTER — Encounter: Payer: Self-pay | Admitting: Family Medicine

## 2010-08-22 ENCOUNTER — Encounter: Payer: Self-pay | Admitting: Family Medicine

## 2010-08-22 ENCOUNTER — Ambulatory Visit (INDEPENDENT_AMBULATORY_CARE_PROVIDER_SITE_OTHER): Payer: 59 | Admitting: Family Medicine

## 2010-08-22 VITALS — BP 120/82 | HR 81 | Resp 16 | Ht 64.0 in | Wt 312.8 lb

## 2010-08-22 DIAGNOSIS — F329 Major depressive disorder, single episode, unspecified: Secondary | ICD-10-CM

## 2010-08-22 DIAGNOSIS — M542 Cervicalgia: Secondary | ICD-10-CM

## 2010-08-22 DIAGNOSIS — E669 Obesity, unspecified: Secondary | ICD-10-CM

## 2010-08-22 MED ORDER — IBUPROFEN 800 MG PO TABS: 800.0000 mg | ORAL_TABLET | Freq: Three times a day (TID) | ORAL | Status: AC | PRN

## 2010-08-22 MED ORDER — NORGESTIM-ETH ESTRAD TRIPHASIC 0.18/0.215/0.25 MG-35 MCG PO TABS: 0.0350 mg | ORAL_TABLET | Freq: Every day | ORAL | Status: DC

## 2010-08-22 MED ORDER — CYCLOBENZAPRINE HCL 10 MG PO TABS: 10.0000 mg | ORAL_TABLET | Freq: Every evening | ORAL | Status: DC | PRN

## 2010-08-22 MED ORDER — CITALOPRAM HYDROBROMIDE 10 MG PO TABS: 10.0000 mg | ORAL_TABLET | Freq: Every day | ORAL | Status: DC

## 2010-08-22 MED ORDER — KETOROLAC TROMETHAMINE 60 MG/2ML IM SOLN
60.0000 mg | Freq: Once | INTRAMUSCULAR | Status: AC
  Administered 2010-08-22: 60 mg via INTRAMUSCULAR

## 2010-08-22 NOTE — Progress Notes (Signed)
  Subjective:    Patient ID: Claire [name], female    DOB: [birth year], 30 y.o.   MRN: [id number]  HPI  1 week h/o right neck pain and spasm, some reilef with ibuprofen, taking ibuprofen  400mg  twice daily, some relief , pain is about a 2 , yesterday was a 7 to 8 She is extremely stresed and depressed. Increased withdrawal and crying spells.School is a challenge , she is doing well however, but her weight and lack of income compounded has her depressed. She is not suicidal or homicidal and denies hallucoinations . She is not walking or exercising and s sporadic as far as eating habits are concerned  Review of Systems Denies recent fever or chills. Denies sinus pressure, nasal congestion, ear pain or sore throat. Denies chest congestion, productive cough or wheezing. Denies chest pains, palpitations, paroxysmal nocturnal dyspnea, orthopnea and leg swelling Denies abdominal pain, nausea, vomiting,diarrhea or constipation.  Denies rectal bleeding or change in bowel movement. Denies dysuria, frequency, hesitancy or incontinence. Denies headaches, seizure, numbness, or tingling.  Denies skin break down or rash.        Objective:   Physical Exam Patient alert and oriented and in no Cardiopulmonary distress.  HEENT: No facial asymmetry, EOMI, no sinus tenderness, TM's clear, Oropharynx pink and moist.  Neck  Decreased ROM with trapezius spasm  Chest: Clear to auscultation bilaterally.  CVS: S1, S2 no murmurs, no S3.  ABD: Soft non tender. Bowel sounds normal.  Ext: No edema  MS: Adequate ROM spine, shoulders, hips and knees.  Skin: Intact, no ulcerations or rash noted.  Psych: Good eye contact, flat affect. Memory intact depressed appearing.Tearful  CNS: CN 2-12 intact, power, tone and sensation normal throughout.        Assessment & Plan:

## 2010-08-22 NOTE — Patient Instructions (Signed)
F/u in 2 months.  You will get an injection for right neck pain and med is sent to your pharmacy.  New med to be started for depression.  Pls commit walking for 30 minutes at least 5 days per week.  Pls change all sweets including and espescialy ice cream to apples, pears and fruits and carrots.  Pls only water as a drink , crystal light or diet drinks

## 2010-09-05 ENCOUNTER — Encounter: Payer: Self-pay | Admitting: Family Medicine

## 2010-09-05 DIAGNOSIS — M542 Cervicalgia: Secondary | ICD-10-CM | POA: Insufficient documentation

## 2010-09-05 DIAGNOSIS — F329 Major depressive disorder, single episode, unspecified: Secondary | ICD-10-CM | POA: Insufficient documentation

## 2010-09-05 DIAGNOSIS — F32A Depression, unspecified: Secondary | ICD-10-CM | POA: Insufficient documentation

## 2010-09-05 NOTE — Assessment & Plan Note (Signed)
Acute onset, toradol administered and muscle relaxant  Prescribed. Use of cool compress and massage also encouraged

## 2010-09-05 NOTE — Assessment & Plan Note (Signed)
Deteriorated. Patient re-educated about  the importance of commitment to a  minimum of 150 minutes of exercise per week. The importance of healthy food choices with portion control discussed. Encouraged to start a food diary, count calories and to consider  joining a support group. Sample diet sheets offered. Goals set by the patient for the next several months.    

## 2010-09-05 NOTE — Assessment & Plan Note (Signed)
Moderately severe. Encouraged pt to ventilate for approx 10 mins, she is neither suicidal or homicidal, started her on antidepressants

## 2010-09-14 NOTE — H&P (Signed)
NAME:  Claire [name], Claire [name]                           ACCOUNT NO.:  [id number]   MEDICAL RECORD NO.:  [id number]                   PATIENT TYPE:  EMS   LOCATION:  ED                                   FACILITY:  [facility]   PHYSICIAN:  Dirk Dress. Katrinka Blazing, M.D.                DATE OF BIRTH:  06-20-1980   DATE OF ADMISSION:  06/25/2003  DATE OF DISCHARGE:                                HISTORY & PHYSICAL   EMERGENCY ROOM NOTE AND HISTORY AND PHYSICAL   This is a 30 year old female with a 69-month history of epigastric and right  upper quadrant pain.  The pain has radiated through to her back.  She had  nausea, but no vomiting.  Symptoms have become much worse over the past 2  weeks. She was treated with hydrocodone without improvement.  Dr. Lodema Hong  ordered an abdominal ultrasound which was negative, but she had a HIDA scan  which was done yesterday and showed a gallbladder ejection fraction of about  16%.  The patient remains severely symptomatic and she was seen in the  emergency room for evaluation.  She is severely symptomatic, though she is  not acutely inflamed.  She will be treated in the emergency room and will be  scheduled for cholecystectomy early next week.   PAST HISTORY:  She has no major medical illness except for hypertension.  She has had about a 40-pound weight loss recently.   MEDICATIONS:  Oral contraceptives and Diovan 80 one daily.   PAST SURGICAL HISTORY:  She has not had previous surgery.   FAMILY HISTORY:  Positive for gallbladder disease, hypertension, diabetes  mellitus, and hyperlipidemia.   ALLERGIES:  She has no known drug allergies.   SOCIAL HISTORY:  The patient is single.  She is employed.  She smokes 1/2  pack of cigarettes per day.  She does not drink or use drugs.   REVIEW OF SYSTEMS:  Unremarkable except for abdominal pain.   PHYSICAL EXAMINATION:  GENERAL:  On examination she is in no acute distress.  VITAL SIGNS:  Blood pressure 101/60, pulse 71,  respirations 20, temperature  97.4.  HEENT:  Unremarkable.  NECK:  Supple without JVD or bruit.  CHEST:  Clear to auscultation.  HEART:  Regular rate and rhythm without murmur, gallop or rub.  ABDOMEN:  Soft with mild epigastric and right upper quadrant tenderness with  guarding.  She has normoactive bowel sounds  EXTREMITIES:  No clubbing, cyanosis, or edema.  NEUROLOGIC:  No focal, motor, sensory, or cerebellar deficit.  Cranial  nerves intact.   IMPRESSION:  1. Chronic cholecystitis with symptomatic biliary colic.  2. Hypertension.   PLAN:  The patient is treated in the emergency room.  She will be given  Demerol and Phenergan IM and she will have Tylox and Phenergan p.o. to use  over the next 2 days. She is scheduled for laparoscopic cholecystectomy on  March 1.  If her symptoms should become worse we will proceed with surgery  on an emergency basis.     ___________________________________________                                         Dirk Dress Katrinka Blazing, M.D.   LCS/MEDQ  D:  06/25/2003  T:  06/25/2003  Job:  [id number]   cc:   Day Surgery   Milus Mallick. Lodema Hong, M.D.  [address]  [place], [place] [address]  Fax: [phone]   ER Record

## 2010-09-14 NOTE — Op Note (Signed)
NAME:  Claire [name], Claire [name]                           ACCOUNT NO.:  [id number]   MEDICAL RECORD NO.:  [id number]                   PATIENT TYPE:  AMB   LOCATION:  DAY                                  FACILITY:  [facility]   PHYSICIAN:  Jerolyn Shin C. Katrinka Blazing, M.D.                DATE OF BIRTH:  1980-08-18   DATE OF PROCEDURE:  DATE OF DISCHARGE:                                 OPERATIVE REPORT   PREOPERATIVE DIAGNOSIS:  Chronic cholecystitis.   POSTOPERATIVE DIAGNOSIS:  Chronic cholecystitis.   PROCEDURE:  Laparoscopic cholecystectomy.   SURGEON:  Dirk Dress. Katrinka Blazing, M.D.   DESCRIPTION:  Under general anesthesia the patient's abdomen was prepped and  draped in a sterile field.  A supraumbilical incision was made.  A Veress  needle was inserted into the peritoneal cavity without difficulty.  The  abdomen was insufflated with 3 liters of CO2.  Using a Vis-A-Port guide a 10-  mm port was placed uneventfully.  Laparoscope was placed.  A dilated  gallbladder with extensive adhesions was noted.   Under videoscopic guidance a 10-mm port and two 5-mm ports were placed.  The  gallbladder was grasped and positioned.  Using electrocautery all of the  dense adhesions to the gallbladder were taken down without difficulty.  The  artery had 2 branches.  Each branch was clipped with 3 clips and divided.  The cystic artery was dissected back to the gallbladder.  It was clipped  with 5 clips and divided.  Using electrocautery the gallbladder was  separated from the infrahepatic bed without difficulty.  It was retrieved in  an EndoCatch device.   Copious irrigation was carried out.  There was no bleeding and no bile leak.  CO2 was allowed to escape from the abdomen and the ports were removed.  The  patient tolerated the procedure well.  The incisions were closed using #0  Dexon on the fascia of the larger incisions.  Skin was closed with staples.  The patient tolerated the procedure well.  Dressings were placed.  She  was  awakened from anesthesia uneventfully, transferred to a bed, and taken to  the postanesthetic care unit for further monitoring.      ___________________________________________                                            Dirk Dress. Katrinka Blazing, M.D.   LCS/MEDQ  D:  06/28/2003  T:  06/28/2003  Job:  [id number]   cc:   Milus Mallick. Lodema Hong, M.D.  [address]  [place], [place] [address]  Fax: [phone]

## 2010-09-19 ENCOUNTER — Encounter: Payer: Self-pay | Admitting: Family Medicine

## 2010-09-20 ENCOUNTER — Encounter: Payer: Self-pay | Admitting: Family Medicine

## 2010-09-20 ENCOUNTER — Ambulatory Visit (INDEPENDENT_AMBULATORY_CARE_PROVIDER_SITE_OTHER): Payer: 59 | Admitting: Family Medicine

## 2010-09-20 VITALS — BP 120/70 | HR 73 | Resp 16 | Ht 64.25 in | Wt 307.1 lb

## 2010-09-20 DIAGNOSIS — L309 Dermatitis, unspecified: Secondary | ICD-10-CM | POA: Insufficient documentation

## 2010-09-20 DIAGNOSIS — F3289 Other specified depressive episodes: Secondary | ICD-10-CM

## 2010-09-20 DIAGNOSIS — F329 Major depressive disorder, single episode, unspecified: Secondary | ICD-10-CM

## 2010-09-20 DIAGNOSIS — E669 Obesity, unspecified: Secondary | ICD-10-CM

## 2010-09-20 DIAGNOSIS — L259 Unspecified contact dermatitis, unspecified cause: Secondary | ICD-10-CM

## 2010-09-20 MED ORDER — MOMETASONE FUROATE 0.1 % EX CREA: TOPICAL_CREAM | CUTANEOUS | Status: DC

## 2010-09-20 NOTE — Progress Notes (Signed)
  Subjective:    Patient ID: Claire [name], female    DOB: 09-09-1980, 30 y.o.   MRN: [id number]  HPI 2 week h/o blistering rash on the 3 lateral fingers , burning and itching, clear liquid comes out ofit , no other known contact with similar symptome, denies purulent drainage , fever or chills. No recent change in detergents or personal care items. Excellent response to citalopram , depression symptoms are relieved , eating habits have improved, and involved in regular exercise with weight loss   Review of Systems See HPI. Resp denies head or chest congestion GI: denies abdominal pain, vomiting diareah or constipation    Objective:   Physical Exam Patient alert and oriented and in no Cardiopulmonary distress.  HEENT: No facial asymmetry, EOMI, .  Neck supple no adenopathy.  Chest: Clear to auscultation bilaterally.  CVS: S1, S2 no murmurs, no S3.  ABD: Soft non tender. .  Ext: No edema  MS: Adequate ROM spine, shoulders, hips and knees.  Skin: macular lesions with circular erythematous border on fingers  Psych: Good eye contact, normal affect. Memory intact not anxious or depressed appearing.  CNS: CN 2-12 intact, power,  normal throughout.       Assessment & Plan:

## 2010-09-20 NOTE — Patient Instructions (Signed)
F/u as before.  Medication has been sent in for rash, if it perssits pls call for a referral to dermatology

## 2010-09-24 NOTE — Assessment & Plan Note (Signed)
Excellent response to med continue same

## 2010-09-24 NOTE — Assessment & Plan Note (Signed)
Trial of a topical steroid, if no response derm eval

## 2010-09-24 NOTE — Assessment & Plan Note (Signed)
Improved. Pt applauded on succesful weight loss through lifestyle change, and encouraged to continue same. Weight loss goal set for the next several months.  

## 2010-10-16 ENCOUNTER — Encounter: Payer: Self-pay | Admitting: Family Medicine

## 2010-10-19 ENCOUNTER — Encounter: Payer: Self-pay | Admitting: Family Medicine

## 2010-10-22 ENCOUNTER — Ambulatory Visit (INDEPENDENT_AMBULATORY_CARE_PROVIDER_SITE_OTHER): Payer: 59 | Admitting: Family Medicine

## 2010-10-22 ENCOUNTER — Encounter: Payer: Self-pay | Admitting: Family Medicine

## 2010-10-22 VITALS — BP 112/82 | HR 84 | Resp 16 | Ht 64.5 in | Wt 305.8 lb

## 2010-10-22 DIAGNOSIS — L309 Dermatitis, unspecified: Secondary | ICD-10-CM

## 2010-10-22 DIAGNOSIS — N3090 Cystitis, unspecified without hematuria: Secondary | ICD-10-CM | POA: Insufficient documentation

## 2010-10-22 DIAGNOSIS — N390 Urinary tract infection, site not specified: Secondary | ICD-10-CM

## 2010-10-22 DIAGNOSIS — E669 Obesity, unspecified: Secondary | ICD-10-CM

## 2010-10-22 DIAGNOSIS — L259 Unspecified contact dermatitis, unspecified cause: Secondary | ICD-10-CM

## 2010-10-22 DIAGNOSIS — F329 Major depressive disorder, single episode, unspecified: Secondary | ICD-10-CM

## 2010-10-22 DIAGNOSIS — N760 Acute vaginitis: Secondary | ICD-10-CM

## 2010-10-22 LAB — POCT URINALYSIS DIPSTICK
Glucose, UA: NEGATIVE
Nitrite, UA: NEGATIVE
Urobilinogen, UA: 0.2

## 2010-10-22 MED ORDER — FLUCONAZOLE 150 MG PO TABS: 150.0000 mg | ORAL_TABLET | Freq: Once | ORAL | Status: AC

## 2010-10-22 MED ORDER — CIPROFLOXACIN HCL 500 MG PO TABS: 500.0000 mg | ORAL_TABLET | Freq: Two times a day (BID) | ORAL | Status: AC

## 2010-10-22 MED ORDER — CITALOPRAM HYDROBROMIDE 20 MG PO TABS: ORAL_TABLET | ORAL | Status: DC

## 2010-10-22 NOTE — Assessment & Plan Note (Signed)
Improved, though still inadequately treated, dose increase

## 2010-10-22 NOTE — Patient Instructions (Signed)
F/u in 3 months. Glad you are doing better, asnd congrats on weight loss and lifestyle changes.  You will get info on 15gm carbohydrate portions.  A healthy diet is rich in fruit, vegetables and whole grains. Poultry fish, nuts and beans are a healthy choice for protein rather then red meat. A low sodium diet and drinking 64 ounces of water daily is generally recommended. Oils and sweet should be limited. Carbohydrates especially for those who are diabetic or overweight, should be limited to 30-45 gram per meal. It is important to eat on a regular schedule, at least 3 times daily. Snacks should be primarily fruits, vegetables or nuts.  It is important that you exercise regularly at least 30 minutes 7 times a week. If you develop chest pain, have severe difficulty breathing, or feel very tired, stop exercising immediately and seek medical attention   HBA1C today.  Med is sent in for uTI and dose of citalopram is increased

## 2010-10-23 NOTE — Assessment & Plan Note (Signed)
Pt experiencing recurrent blisters on her hand, needs to call back to see dermatology about this, not interested at this time

## 2010-10-23 NOTE — Progress Notes (Signed)
  Subjective:    Patient ID: Claire [name], female    DOB: June 01, 1980, 30 y.o.   MRN: [id number]  HPI The PT is here for follow up and re-evaluation of chronic medical conditions, medication management and review of any  recent lab and radiology data.  Preventive health is updated, specifically  Cancer screening, Osteoporosis screening and Immunization.   Questions or concerns regarding consultations or procedures which the PT has had in the interim are  addressed. The PT denies any adverse reactions to current medications since the last visit.   States depression has responded well to her med, still has some sense of being overwhelmed at times     Review of Systems Denies recent fever or chills. Denies sinus pressure, nasal congestion, ear pain or sore throat. Denies chest congestion, productive cough or wheezing. Denies chest pains, palpitations, paroxysmal nocturnal dyspnea, orthopnea and leg swelling Denies abdominal pain, nausea, vomiting,diarrhea or constipation.  Denies rectal bleeding or change in bowel movement. C/o dysuria and  Frequency for the past 3 days. Denies flank pain  Denies joint pain, swelling and limitation in mobility. Denies headaches, seizure, numbness, or tingling. Denies uncontrolled depression, anxiety or insomnia. She has started walking more regularly and has changed her diet with weight loss success Reports few new blisters in her palms , but no interest in derm eval at this time. I believe these are viral in origin        Objective:   Physical Exam    Patient alert and oriented and in no Cardiopulmonary distress.  HEENT: No facial asymmetry, EOMI, no sinus tenderness, TM's clear, Oropharynx pink and moist.  Neck supple no adenopathy.  Chest: Clear to auscultation bilaterally.  CVS: S1, S2 no murmurs, no S3.  ABD: Soft non tender. Bowel sounds normal.  Ext: No edema  MS: Adequate ROM spine, shoulders, hips and knees.  Skin: Intact, no  ulcerations or rash noted.Few blisters in palm, no sign of bacterial superinfection  Psych: Good eye contact, normal affect. Memory intact not anxious or depressed appearing.  CNS: CN 2-12 intact, power, tone and sensation normal throughout.     Assessment & Plan:

## 2010-10-23 NOTE — Assessment & Plan Note (Signed)
Improved. Pt applauded on succesful weight loss through lifestyle change, and encouraged to continue same. Weight loss goal set for the next several months.  

## 2010-10-23 NOTE — Assessment & Plan Note (Signed)
Acute symptoms with abn CCUA, cipro prescribed and specimen sent for culture

## 2010-10-24 LAB — URINE CULTURE: Colony Count: 60000

## 2010-11-13 ENCOUNTER — Ambulatory Visit: Payer: Self-pay | Admitting: Family Medicine

## 2011-01-24 ENCOUNTER — Ambulatory Visit: Payer: Self-pay | Admitting: Family Medicine

## 2011-02-08 LAB — DIFFERENTIAL
Basophils Relative: 0
Eosinophils Absolute: 0.2
Lymphs Abs: 1.4
Neutro Abs: 3.2
Neutrophils Relative %: 60

## 2011-02-08 LAB — STREP A DNA PROBE: Group A Strep Probe: NEGATIVE

## 2011-02-08 LAB — BASIC METABOLIC PANEL
BUN: 11
Calcium: 8.6
Chloride: 106
Creatinine, Ser: 0.73
GFR calc Af Amer: >60
GFR calc non Af Amer: >60

## 2011-02-08 LAB — CBC
MCV: 88.7
Platelets: 249
RBC: 4.36
WBC: 5.4

## 2011-02-08 LAB — RAPID STREP SCREEN (MED CTR MEBANE ONLY): Streptococcus, Group A Screen (Direct): NEGATIVE

## 2011-02-12 ENCOUNTER — Ambulatory Visit (INDEPENDENT_AMBULATORY_CARE_PROVIDER_SITE_OTHER): Payer: Self-pay

## 2011-02-12 VITALS — BP 124/82 | Wt 302.0 lb

## 2011-02-12 DIAGNOSIS — Z111 Encounter for screening for respiratory tuberculosis: Secondary | ICD-10-CM

## 2011-02-12 NOTE — Progress Notes (Signed)
PPD placed with no complications

## 2011-02-14 LAB — TB SKIN TEST
Induration: 0
TB Skin Test: NEGATIVE mm

## 2011-02-18 ENCOUNTER — Telehealth: Payer: Self-pay | Admitting: Family Medicine

## 2011-02-18 NOTE — Telephone Encounter (Signed)
Do you have form?

## 2011-02-18 NOTE — Telephone Encounter (Signed)
Her work note was signed off a long time ago that's the only form I know about pls look in already scanned records. I even had written a note that she did not /should not have filled in a part of it. Pls let me know if can't find it , I am assumong this is the form she was requeasting

## 2011-02-19 ENCOUNTER — Telehealth: Payer: Self-pay | Admitting: Family Medicine

## 2011-02-19 NOTE — Telephone Encounter (Signed)
Form signed off pls send

## 2011-04-18 ENCOUNTER — Ambulatory Visit (INDEPENDENT_AMBULATORY_CARE_PROVIDER_SITE_OTHER): Payer: Self-pay | Admitting: Family Medicine

## 2011-04-18 ENCOUNTER — Encounter: Payer: Self-pay | Admitting: Family Medicine

## 2011-04-18 VITALS — BP 118/80 | HR 97 | Temp 98.0°F | Resp 16 | Ht 64.5 in | Wt 309.1 lb

## 2011-04-18 DIAGNOSIS — R51 Headache: Secondary | ICD-10-CM

## 2011-04-18 DIAGNOSIS — H6691 Otitis media, unspecified, right ear: Secondary | ICD-10-CM | POA: Insufficient documentation

## 2011-04-18 DIAGNOSIS — J309 Allergic rhinitis, unspecified: Secondary | ICD-10-CM

## 2011-04-18 DIAGNOSIS — H6690 Otitis media, unspecified, unspecified ear: Secondary | ICD-10-CM

## 2011-04-18 DIAGNOSIS — R5383 Other fatigue: Secondary | ICD-10-CM

## 2011-04-18 DIAGNOSIS — R5381 Other malaise: Secondary | ICD-10-CM

## 2011-04-18 DIAGNOSIS — K921 Melena: Secondary | ICD-10-CM

## 2011-04-18 DIAGNOSIS — R7301 Impaired fasting glucose: Secondary | ICD-10-CM

## 2011-04-18 DIAGNOSIS — E669 Obesity, unspecified: Secondary | ICD-10-CM

## 2011-04-18 MED ORDER — KETOROLAC TROMETHAMINE 60 MG/2ML IJ SOLN
60.0000 mg | Freq: Once | INTRAMUSCULAR | Status: AC
  Administered 2011-04-18: 60 mg via INTRAMUSCULAR

## 2011-04-18 MED ORDER — LORATADINE 10 MG PO TABS: 10.0000 mg | ORAL_TABLET | Freq: Every day | ORAL | Status: DC

## 2011-04-18 MED ORDER — FLUCONAZOLE 150 MG PO TABS: 150.0000 mg | ORAL_TABLET | Freq: Once | ORAL | Status: AC

## 2011-04-18 MED ORDER — MEDROXYPROGESTERONE ACETATE 150 MG/ML IM SUSP: 150.0000 mg | Freq: Once | INTRAMUSCULAR | Status: DC

## 2011-04-18 MED ORDER — AMOXICILLIN 500 MG PO CAPS: 500.0000 mg | ORAL_CAPSULE | Freq: Three times a day (TID) | ORAL | Status: AC

## 2011-04-18 MED ORDER — METHYLPREDNISOLONE ACETATE 80 MG/ML IJ SUSP
80.0000 mg | Freq: Once | INTRAMUSCULAR | Status: AC
  Administered 2011-04-18: 80 mg via INTRAMUSCULAR

## 2011-04-18 NOTE — Assessment & Plan Note (Signed)
ROM , antibioitc prescribed for 10 days

## 2011-04-18 NOTE — Assessment & Plan Note (Signed)
Deteriorated. Patient re-educated about  the importance of commitment to a  minimum of 150 minutes of exercise per week. The importance of healthy food choices with portion control discussed. Encouraged to start a food diary, count calories and to consider  joining a support group. Sample diet sheets offered. Goals set by the patient for the next several months.    

## 2011-04-18 NOTE — Patient Instructions (Signed)
CPE March 10 or after,  You are being treated for right mid ear infection, and uncontrolled allergies.  Toradol and depo medrol are administered in the office for headache and allergies.  Take sudafed one daily for excessive nasal congestion.  Antibiotics, claritin and fluconazole are prescribed.  Go to ED if symptoms worsen please.  Fasting chem 7, lipid, TSH , Hba1C, vit D and CBC in march before next visit pls  It is important that you exercise regularly at least 30 minutes 5 times a week. If you develop chest pain, have severe difficulty breathing, or feel very tired, stop exercising immediately and seek medical attention   A healthy diet is rich in fruit, vegetables and whole grains. Poultry fish, nuts and beans are a healthy choice for protein rather then red meat. A low sodium diet and drinking 64 ounces of water daily is generally recommended. Oils and sweet should be limited. Carbohydrates especially for those who are diabetic or overweight, should be limited to 30-45 gram per meal. It is important to eat on a regular schedule, at least 3 times daily. Snacks should be primarily fruits, vegetables or nuts.

## 2011-04-18 NOTE — Assessment & Plan Note (Signed)
Headache right posterior ear x 2 days , neurologically intact, toradol 60 mg and depomedrol 80 mg Im administered, warned if worsens to go to ED

## 2011-04-18 NOTE — Progress Notes (Signed)
Subjective:     Patient ID: Claire [name], female   DOB: 28-Mar-1981, 30 y.o.   MRN: [id number]  HPI  2 day h/o incread head congestion, however severe right post ear headache since 1 day ago. Also c/o right ear pain. No nasal drainage, no sore throat or , fever , but feels weak and has had chills No productive  Denies any focal weakness or numbness or blurred vision  Review of Systems See HPI  Denies chest pains, palpitations and leg swelling Denies abdominal pain, nausea, vomiting,diarrhea or constipation.   Denies dysuria, frequency, hesitancy or incontinence. Denies joint pain, swelling and limitation in mobility. Deniesseizures, numbness, or tingling. Denies depression, anxiety or insomnia. Denies skin break down or rash.        Objective:   Physical Exam Patient alert and oriented and in no cardiopulmonary distress.  HEENT: No facial asymmetry, EOMI, no sinus tenderness,  oropharynx pink and moist.  Neck supple no adenopathy.Right TM erythematous with decreaesd light reflex.   Chest: Clear to auscultation bilaterally.  CVS: S1, S2 no murmurs, no S3.  ABD: Soft non tender. Bowel sounds normal.  Ext: No edema  MS: Adequate ROM spine, shoulders, hips and knees.  Skin: Intact, no ulcerations or rash noted.  Psych: Good eye contact, normal affect. Memory intact not anxious or depressed appearing.  CNS: CN 2-12 intact, power, tone and sensation normal throughout.     Assessment:         Plan:

## 2011-05-23 ENCOUNTER — Other Ambulatory Visit: Payer: Self-pay | Admitting: Family Medicine

## 2011-06-20 ENCOUNTER — Telehealth: Payer: Self-pay | Admitting: Family Medicine

## 2011-06-20 ENCOUNTER — Ambulatory Visit (INDEPENDENT_AMBULATORY_CARE_PROVIDER_SITE_OTHER): Payer: Self-pay | Admitting: Family Medicine

## 2011-06-20 ENCOUNTER — Encounter: Payer: Self-pay | Admitting: Family Medicine

## 2011-06-20 VITALS — BP 122/72 | HR 96 | Temp 97.8°F | Resp 18 | Ht 64.5 in | Wt 317.0 lb

## 2011-06-20 DIAGNOSIS — H6690 Otitis media, unspecified, unspecified ear: Secondary | ICD-10-CM

## 2011-06-20 DIAGNOSIS — H6692 Otitis media, unspecified, left ear: Secondary | ICD-10-CM

## 2011-06-20 DIAGNOSIS — J0100 Acute maxillary sinusitis, unspecified: Secondary | ICD-10-CM

## 2011-06-20 DIAGNOSIS — J209 Acute bronchitis, unspecified: Secondary | ICD-10-CM | POA: Insufficient documentation

## 2011-06-20 MED ORDER — BENZONATATE 100 MG PO CAPS: 100.0000 mg | ORAL_CAPSULE | Freq: Four times a day (QID) | ORAL | Status: AC | PRN

## 2011-06-20 MED ORDER — PENICILLIN V POTASSIUM 500 MG PO TABS: 500.0000 mg | ORAL_TABLET | Freq: Three times a day (TID) | ORAL | Status: AC

## 2011-06-20 MED ORDER — FLUCONAZOLE 150 MG PO TABS: ORAL_TABLET | ORAL | Status: AC

## 2011-06-20 MED ORDER — AMOXICILLIN-POT CLAVULANATE 875-125 MG PO TABS: 1.0000 | ORAL_TABLET | Freq: Two times a day (BID) | ORAL | Status: DC

## 2011-06-20 MED ORDER — CEFTRIAXONE SODIUM 1 G IJ SOLR
500.0000 mg | Freq: Once | INTRAMUSCULAR | Status: AC
  Administered 2011-06-20: 500 mg via INTRAMUSCULAR

## 2011-06-20 NOTE — Patient Instructions (Addendum)
F/U as before.  You are being treated for acute sinusitis, bronchitis and left otitis media. You will get Rocephin 500mg  IM in the office and medication is also sent in.   School and Work excuse each to cover 02/21 and 06/21/2011  You will be provided with general information on the conditions you have also

## 2011-06-20 NOTE — Telephone Encounter (Signed)
Can she have a different antibiotic sent in?

## 2011-06-20 NOTE — Telephone Encounter (Signed)
Let her know pcn sent in

## 2011-06-20 NOTE — Progress Notes (Signed)
  Subjective:    Patient ID: Claire [name], female    DOB: 31-Oct-1980, 30 y.o.   MRN: [id number]  HPI 6 day h/o URI and lower resp symptoms and left ear pain. Symptoms have worsened, she is experiencing sore hroat, facial pressure ith yellow nasal drainage and chest congestion with cough productive of yellow sputum which is thick. Feels . Has had intermittent fever and chills   Review of Systems See HPI Denies  palpitations and leg swelling Denies abdominal pain, nausea, vomiting,diarrhea or constipation.   Denies dysuria, frequency, hesitancy or incontinence. Denies joint pain, swelling and limitation in mobility. Denies headches, seizures, numbness, or tingling. Denies depression, anxiety or insomnia. Denies skin break down or rash.       Objective:   Physical Exam  Patient alert and oriented and in no cardiopulmonary distress.Ill appearing  HEENT: No facial asymmetry, EOMI, maxillary  sinus tenderness,  oropharynx erythematous and moist.  Neck supple anterior cervical  adenopathy.  Chest: decreased air entry scatteered crackles no wheezes CVS: S1, S2 no murmurs, no S3.  ABD: Soft non tender. Bowel sounds normal.  Ext: No edema  MS: Adequate ROM spine, shoulders, hips and knees.  Skin: Intact, no ulcerations or rash noted.  Psych: Good eye contact, normal affect. Memory intact not anxious or depressed appearing.  CNS: CN 2-12 intact, power, tone and sensation normal throughout.`       Assessment & Plan:

## 2011-06-20 NOTE — Telephone Encounter (Signed)
I have the [facility] $4 list if you want to use one of those

## 2011-06-20 NOTE — Telephone Encounter (Signed)
Pt aware.

## 2011-06-24 ENCOUNTER — Other Ambulatory Visit: Payer: Self-pay | Admitting: Family Medicine

## 2011-06-24 ENCOUNTER — Telehealth: Payer: Self-pay

## 2011-06-24 NOTE — Telephone Encounter (Signed)
Since she staetes cannot hear and was dx with ear infection I recommend she see ENT andwill refer her to ent in [place] , let her know please. Continue current antibiotic

## 2011-06-24 NOTE — Telephone Encounter (Signed)
Not feeling any better Sore throat had went away but is starting to come back again. Left ear is still clogged and can't hear anything. Still green drainage. She started taking meds Friday so its only been 3 days. Should she wait a little longer to see if symptoms improve or do you want to do anything different?  [phone]

## 2011-06-25 NOTE — Telephone Encounter (Signed)
Patient aware and already has appt  

## 2011-06-28 ENCOUNTER — Other Ambulatory Visit: Payer: Self-pay | Admitting: Family Medicine

## 2011-06-28 ENCOUNTER — Telehealth: Payer: Self-pay | Admitting: Family Medicine

## 2011-06-28 MED ORDER — DOXYCYCLINE HYCLATE 100 MG PO TABS: 100.0000 mg | ORAL_TABLET | Freq: Two times a day (BID) | ORAL | Status: DC

## 2011-06-28 NOTE — Telephone Encounter (Signed)
Left message notifying pt of new rx and that she should call the office back.

## 2011-06-28 NOTE — Telephone Encounter (Signed)
pls let her know doxycycline is sent in, if nothing changes, as said before she absolutely needs to have sputum sent, a cXR and office re evaluation. Infection that are clinically not improving are serious

## 2011-06-28 NOTE — Telephone Encounter (Signed)
Pt aware.

## 2011-07-01 ENCOUNTER — Other Ambulatory Visit: Payer: Self-pay | Admitting: Family Medicine

## 2011-07-01 ENCOUNTER — Other Ambulatory Visit: Payer: Self-pay

## 2011-07-01 ENCOUNTER — Telehealth: Payer: Self-pay | Admitting: Family Medicine

## 2011-07-01 MED ORDER — DOXYCYCLINE HYCLATE 100 MG PO TABS: 100.0000 mg | ORAL_TABLET | Freq: Two times a day (BID) | ORAL | Status: DC

## 2011-07-01 MED ORDER — SULFAMETHOXAZOLE-TRIMETHOPRIM 800-160 MG PO TABS: 1.0000 | ORAL_TABLET | Freq: Two times a day (BID) | ORAL | Status: AC

## 2011-07-01 NOTE — Telephone Encounter (Signed)
Claire [name] gave RX to her sister that was here earlier

## 2011-07-06 NOTE — Assessment & Plan Note (Signed)
Antibiotic in office and prescribed

## 2011-07-06 NOTE — Assessment & Plan Note (Signed)
Antibiotic prescribed 

## 2011-07-09 ENCOUNTER — Other Ambulatory Visit (HOSPITAL_COMMUNITY)
Admission: RE | Admit: 2011-07-09 | Discharge: 2011-07-09 | Disposition: A | Discharge status: Home / Self Care | Payer: 59 | Source: Ambulatory Visit | Attending: Family Medicine | Admitting: Family Medicine

## 2011-07-09 ENCOUNTER — Encounter: Payer: Self-pay | Admitting: Family Medicine

## 2011-07-09 ENCOUNTER — Ambulatory Visit (INDEPENDENT_AMBULATORY_CARE_PROVIDER_SITE_OTHER): Payer: Self-pay | Admitting: Family Medicine

## 2011-07-09 VITALS — BP 118/74 | HR 72 | Resp 16 | Ht 64.5 in | Wt 315.0 lb

## 2011-07-09 DIAGNOSIS — E669 Obesity, unspecified: Secondary | ICD-10-CM

## 2011-07-09 DIAGNOSIS — Z124 Encounter for screening for malignant neoplasm of cervix: Secondary | ICD-10-CM

## 2011-07-09 DIAGNOSIS — Z0000 Encounter for general adult medical examination without abnormal findings: Secondary | ICD-10-CM

## 2011-07-09 DIAGNOSIS — Z01419 Encounter for gynecological examination (general) (routine) without abnormal findings: Secondary | ICD-10-CM | POA: Insufficient documentation

## 2011-07-09 NOTE — Progress Notes (Signed)
  Subjective:    Patient ID: Claire [name], female    DOB: January 16, 1981, 30 y.o.   MRN: [id number]  HPI The PT is here for annual exam  and re-evaluation of chronic medical conditions, medication management and review of any available recent lab and radiology data.  Preventive health is updated, specifically  Cancer screening and Immunization.   Questions or concerns regarding consultations or procedures which the PT has had in the interim are  addressed. The PT denies any adverse reactions to current medications since the last visit.  Reports resolution of her cough, but she does c/o left ear pressure and sinus fullness. No weight loss success yet, and she no longer takes anti depressant. Plans to work on improved food control     Review of Systems See HPI Denies recent fever or chills. Denies sinus pressure, nasal congestion, ear pain or sore throat. Denies chest congestion, productive cough or wheezing. Denies chest pains, palpitations and leg swelling Denies abdominal pain, nausea, vomiting,diarrhea or constipation.   Denies dysuria, frequency, hesitancy or incontinence. Denies joint pain, swelling and limitation in mobility. Denies headaches, seizures, numbness, or tingling. Denies depression, anxiety or insomnia. Denies skin break down or rash.        Objective:   Physical Exam  Pleasantobese female, alert and oriented x 3, in no cardio-pulmonary distress. Afebrile. HEENT No facial trauma or asymetry. Sinuses non tender.  EOMI, PERTL, fundoscopic exam is normal, no hemorhage or exudate.  External ears normal, tympanic membrane clear, fairly good dentition. Neck: supple, no adenopathy,JVD or thyromegaly.No bruits.  Chest: Clear to ascultation bilaterally.No crackles or wheezes. Non tender to palpation  Breast: No asymetry,no masses. No nipple discharge or inversion. No axillary or supraclavicular adenopathy  Cardiovascular system; Heart sounds normal,  S1 and  S2  ,no S3.  No murmur, or thrill. Apical beat not displaced Peripheral pulses normal.  Abdomen: Soft, non tender, no organomegaly or masses. No bruits. Bowel sounds normal. No guarding, tenderness or rebound.  GU: External genitalia normal. No lesions. Vaginal canal normal.No discharge. Uterus normal size, no adnexal masses, no cervical motion or adnexal tenderness.  Musculoskeletal exam: Full ROM of spine, hips , shoulders and knees. No deformity ,swelling or crepitus noted. No muscle wasting or atrophy.   Neurologic: Cranial nerves 2 to 12 intact. Power, tone ,sensation and reflexes normal throughout. No disturbance in gait. No tremor.  Skin: Intact, no ulceration, erythema , scaling or rash noted. Pigmentation normal throughout  Psych; Normal mood and affect. Judgement and concentration normal       Assessment & Plan:

## 2011-07-09 NOTE — Patient Instructions (Signed)
CPE in 12 months  Please call if you need to see me before.  It is important that you exercise regularly at least 30 minutes 5 times a week. If you develop chest pain, have severe difficulty breathing, or feel very tired, stop exercising immediately and seek medical attention   A healthy diet is rich in fruit, vegetables and whole grains. Poultry fish, nuts and beans are a healthy choice for protein rather then red meat. A low sodium diet and drinking 64 ounces of water daily is generally recommended. Oils and sweet should be limited. Carbohydrates especially for those who are diabetic or overweight, should be limited to 30-45 gram per meal. It is important to eat on a regular schedule, at least 3 times daily. Snacks should be primarily fruits, vegetables or nuts.   Please take sudafed one daily as needed for sinus and ear pressure, continue once daily claritin

## 2011-07-10 LAB — CBC
MCH: 27.6 pg (ref 26.0–34.0)
MCHC: 31 g/dL (ref 30.0–36.0)
MCV: 89.2 fL (ref 78.0–100.0)
Platelets: 346 10*3/uL (ref 150–400)
RDW: 15 % (ref 11.5–15.5)

## 2011-07-10 LAB — TSH: TSH: 3.861 u[IU]/mL (ref 0.350–4.500)

## 2011-07-10 LAB — LIPID PANEL
Total CHOL/HDL Ratio: 3.7 Ratio
VLDL: 36 mg/dL (ref 0–40)

## 2011-07-10 LAB — COMPREHENSIVE METABOLIC PANEL
ALT: 11 U/L (ref 0–35)
AST: 12 U/L (ref 0–37)
Chloride: 105 mEq/L (ref 96–112)
Creat: 0.8 mg/dL (ref 0.50–1.10)
Sodium: 137 mEq/L (ref 135–145)
Total Bilirubin: 0.3 mg/dL (ref 0.3–1.2)
Total Protein: 6.7 g/dL (ref 6.0–8.3)

## 2011-07-10 LAB — HEMOGLOBIN A1C
Hgb A1c MFr Bld: 5.6 % (ref <5.7)
Mean Plasma Glucose: 114 mg/dL (ref <117)

## 2011-07-14 NOTE — Assessment & Plan Note (Signed)
Deteriorated. Patient re-educated about  the importance of commitment to a  minimum of 150 minutes of exercise per week. The importance of healthy food choices with portion control discussed. Encouraged to start a food diary, count calories and to consider  joining a support group. Sample diet sheets offered. Goals set by the patient for the next several months.    

## 2011-08-13 ENCOUNTER — Encounter: Payer: Self-pay | Admitting: Family Medicine

## 2011-08-13 ENCOUNTER — Ambulatory Visit (INDEPENDENT_AMBULATORY_CARE_PROVIDER_SITE_OTHER): Payer: Self-pay | Admitting: Family Medicine

## 2011-08-13 VITALS — BP 118/78 | HR 107 | Temp 98.3°F | Resp 18 | Ht 64.5 in | Wt 307.1 lb

## 2011-08-13 DIAGNOSIS — J029 Acute pharyngitis, unspecified: Secondary | ICD-10-CM

## 2011-08-13 MED ORDER — PENICILLIN V POTASSIUM 500 MG PO TABS: 500.0000 mg | ORAL_TABLET | Freq: Three times a day (TID) | ORAL | Status: AC

## 2011-08-13 NOTE — Patient Instructions (Signed)
F/u as before.  You are being treated for acute pharyngitis.  Medication is sent to your pharmacy.  Work excuse from 4/15 to return 08/14/2011

## 2011-08-13 NOTE — Assessment & Plan Note (Signed)
Rapid strep, work excuse to return in am i, and penicillin

## 2011-09-01 NOTE — Progress Notes (Signed)
  Subjective:    Patient ID: Claire [name], female    DOB: 10-01-1980, 30 y.o.   MRN: [id number]  HPI 3 day h/o cough sore throat fever and chills which has worsened. 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 Denies nasal drainae or sinus pressure, denies productive cough. C/o generalized aches   Review of Systems See HPI  Denies chest pains, palpitations and leg swelling Denies abdominal pain, nausea, vomiting,diarrhea or constipation.   Denies dysuria, frequency, hesitancy or incontinence.  Denies headaches, seizures, numbness, or tingling. Denies depression, anxiety or insomnia. Denies skin break down or rash.        Objective:   Physical Exam  Patient alert and oriented and in no cardiopulmonary distress.Ill appearing  HEENT: No facial asymmetry, EOMI, no sinus tenderness,  oropharynx erythematous and moist. No exudate. Neck supple bilateral adenopathy.  Chest: Clear to auscultation bilaterally.  CVS: S1, S2 no murmurs, no S3.  ABD: Soft non tender. Bowel sounds normal.  Ext: No edema  MS: Adequate ROM spine, shoulders, hips and knees.  Skin: Intact, no ulcerations or rash noted.      Assessment & Plan:

## 2011-09-02 ENCOUNTER — Ambulatory Visit (INDEPENDENT_AMBULATORY_CARE_PROVIDER_SITE_OTHER): Payer: Self-pay | Admitting: Family Medicine

## 2011-09-02 ENCOUNTER — Encounter: Payer: Self-pay | Admitting: Family Medicine

## 2011-09-02 VITALS — BP 122/74 | HR 88 | Resp 16 | Ht 64.5 in | Wt 306.0 lb

## 2011-09-02 DIAGNOSIS — L0201 Cutaneous abscess of face: Secondary | ICD-10-CM

## 2011-09-02 DIAGNOSIS — W57XXXA Bitten or stung by nonvenomous insect and other nonvenomous arthropods, initial encounter: Secondary | ICD-10-CM | POA: Insufficient documentation

## 2011-09-02 DIAGNOSIS — L03211 Cellulitis of face: Secondary | ICD-10-CM

## 2011-09-02 DIAGNOSIS — T148 Other injury of unspecified body region: Secondary | ICD-10-CM

## 2011-09-02 MED ORDER — CEPHALEXIN 500 MG PO CAPS: 500.0000 mg | ORAL_CAPSULE | Freq: Four times a day (QID) | ORAL | Status: AC

## 2011-09-02 MED ORDER — FLUCONAZOLE 150 MG PO TABS: ORAL_TABLET | ORAL | Status: AC

## 2011-09-02 MED ORDER — CEFTRIAXONE SODIUM 500 MG IJ SOLR
500.0000 mg | Freq: Once | INTRAMUSCULAR | Status: AC
  Administered 2011-09-02: 500 mg via INTRAMUSCULAR

## 2011-09-02 MED ORDER — CEPHALEXIN 500 MG PO CAPS: 500.0000 mg | ORAL_CAPSULE | Freq: Three times a day (TID) | ORAL | Status: DC

## 2011-09-02 NOTE — Patient Instructions (Signed)
F/u as before. You have cellulitis of right cheek.    Rocephin 500mg  in the office and antibiotic is prescribed for 10 days, call if not getting better or worsens please

## 2011-09-02 NOTE — Progress Notes (Signed)
  Subjective:    Patient ID: Claire [name], female    DOB: 08-Dec-1980, 30 y.o.   MRN: [id number]  HPI Bite to right jaw during sleep 5 nights ago, since then redness , warmth and swelling which has worsened, no fever, chills or drainage   Review of Systems See HPI Denies recent fever or chills. Denies sinus pressure, nasal congestion, ear pain or sore throat. Denies chest congestion, productive cough or wheezing. Denies joint pain, swelling and limitation in mobility. Denies headaches, seizures, numbness, or tingling.        Objective:   Physical Exam  Patient alert and oriented and in no cardiopulmonary distress.  HEENT: No facial asymmetry, EOMI, no sinus tenderness,  oropharynx pink and moist.  Neck supple no adenopathy.Erythema, swelling and tenderness of right jaw, maximum diameter approx 5cm. Puncture site noted  Chest: Clear to auscultation bilaterally.  CVS: S1, S2 no murmurs, no S3.  ABD: Soft non tender. Bowel sounds normal.  Ext: No edema    Psych: Good eye contact, normal affect. Memory intact not anxious or depressed appearing.  CNS: CN 2-12 intact, power,  normal throughout.       Assessment & Plan:

## 2011-09-02 NOTE — Assessment & Plan Note (Signed)
5 day h/o bite with evidence of worsening cellulitis in area, aggressive antibiotic management

## 2011-11-05 ENCOUNTER — Encounter: Payer: Self-pay | Admitting: Family Medicine

## 2011-11-05 ENCOUNTER — Ambulatory Visit (INDEPENDENT_AMBULATORY_CARE_PROVIDER_SITE_OTHER): Payer: Self-pay | Admitting: Family Medicine

## 2011-11-05 VITALS — BP 128/78 | HR 99 | Resp 18 | Ht 64.5 in | Wt 313.0 lb

## 2011-11-05 DIAGNOSIS — N923 Ovulation bleeding: Secondary | ICD-10-CM

## 2011-11-05 DIAGNOSIS — N921 Excessive and frequent menstruation with irregular cycle: Secondary | ICD-10-CM

## 2011-11-05 DIAGNOSIS — N3000 Acute cystitis without hematuria: Secondary | ICD-10-CM | POA: Insufficient documentation

## 2011-11-05 DIAGNOSIS — E669 Obesity, unspecified: Secondary | ICD-10-CM

## 2011-11-05 LAB — POCT URINALYSIS DIPSTICK
Protein, UA: NEGATIVE
Spec Grav, UA: 1.025
Urobilinogen, UA: 0.2
pH, UA: 5.5

## 2011-11-05 MED ORDER — SULFAMETHOXAZOLE-TRIMETHOPRIM 800-160 MG PO TABS: 1.0000 | ORAL_TABLET | Freq: Two times a day (BID) | ORAL | Status: AC

## 2011-11-05 MED ORDER — FLUCONAZOLE 150 MG PO TABS: ORAL_TABLET | ORAL | Status: AC

## 2011-11-05 NOTE — Progress Notes (Signed)
  Subjective:    Patient ID: Claire [name], female    DOB: [birth year], 31 y.o.   MRN: [id number]  HPI 5 day h/o lower abdominal cramping pain with urination which has become excessive, urine looks dark, no fever or chills, new right flank pain x 2 days. Spotting last week, on birthcontrol which she takes faithfully, no vaginal discharge   Review of Systems See HPI Denies recent fever or chills. Denies sinus pressure, nasal congestion, ear pain or sore throat. Denies chest congestion, productive cough or wheezing.  Denies abdominal pain, nausea, vomiting,diarrhea or constipation.   . Denies joint pain, swelling and limitation in mobility. Denies headaches, seizures, numbness, or tingling. Denies depression,has increased  anxiety due to financial difficulty, work hours recently cut Denies skin break down or rash.        Objective:   Physical Exam  Patient alert and oriented and in no cardiopulmonary distress.  HEENT: No facial asymmetry, EOMI, no sinus tenderness,  oropharynx pink and moist.  Neck supple no adenopathy.  Chest: Clear to auscultation bilaterally.  CVS: S1, S2 no murmurs, no S3.  ABD: Soft non tender. Bowel sounds normal.  Ext: No edema    Psych: Good eye contact, normal affect. Memory intact not anxious or depressed appearing.  CNS: CN 2-12 intact, power, normal throughout.       Assessment & Plan:

## 2011-11-05 NOTE — Patient Instructions (Addendum)
F/u as needed, CPE due end March 2014.  You are being treated for a presumed urinary tract infection.  Please call if symptoms persist.  Please use back up contraception for the next 4 to 6 weeks , since you are on antibiotics.  If abnormal menstrual spotting continues , then you will need to be changed to a different pill. Weight gain and increased stress is likely contributing to the current problem

## 2011-11-21 ENCOUNTER — Other Ambulatory Visit: Payer: Self-pay | Admitting: Family Medicine

## 2011-11-23 DIAGNOSIS — N923 Ovulation bleeding: Secondary | ICD-10-CM | POA: Insufficient documentation

## 2011-11-23 NOTE — Assessment & Plan Note (Signed)
Deteriorated. Patient re-educated about  the importance of commitment to a  minimum of 150 minutes of exercise per week. The importance of healthy food choices with portion control discussed. Encouraged to start a food diary, count calories and to consider  joining a support group. Sample diet sheets offered. Goals set by the patient for the next several months.    

## 2011-11-23 NOTE — Assessment & Plan Note (Signed)
Acute symptoms and abnormal uA, will treat presumptively

## 2011-11-23 NOTE — Assessment & Plan Note (Signed)
Probably due to a combination of increased stress and weight gain

## 2012-05-19 ENCOUNTER — Other Ambulatory Visit: Payer: Self-pay | Admitting: Family Medicine

## 2012-06-16 ENCOUNTER — Encounter: Payer: 59 | Admitting: Family Medicine

## 2012-08-28 ENCOUNTER — Other Ambulatory Visit: Payer: Self-pay | Admitting: Family Medicine

## 2013-01-28 ENCOUNTER — Encounter: Payer: Self-pay | Admitting: Family Medicine

## 2013-02-19 ENCOUNTER — Telehealth: Payer: Self-pay

## 2013-02-19 DIAGNOSIS — E669 Obesity, unspecified: Secondary | ICD-10-CM

## 2013-02-19 DIAGNOSIS — Z139 Encounter for screening, unspecified: Secondary | ICD-10-CM

## 2013-02-19 DIAGNOSIS — R7301 Impaired fasting glucose: Secondary | ICD-10-CM

## 2013-02-19 NOTE — Telephone Encounter (Signed)
Ordered and faxed to lab. Message left for patient

## 2013-02-19 NOTE — Telephone Encounter (Signed)
pls order cbc, fasting lipid, chem 7, HBA1C and tSH and let her know

## 2013-02-19 NOTE — Telephone Encounter (Signed)
Coming soon for a CPE and wants to know if she needs labwork before

## 2013-02-25 ENCOUNTER — Other Ambulatory Visit: Payer: Self-pay | Admitting: Family Medicine

## 2013-03-06 LAB — BASIC METABOLIC PANEL
BUN: 10 mg/dL (ref 6–23)
CO2: 27 mEq/L (ref 19–32)
Calcium: 9.5 mg/dL (ref 8.4–10.5)
Chloride: 104 mEq/L (ref 96–112)
Creat: 0.79 mg/dL (ref 0.50–1.10)
Sodium: 140 mEq/L (ref 135–145)

## 2013-03-06 LAB — LIPID PANEL: Total CHOL/HDL Ratio: 4.2 Ratio

## 2013-03-06 LAB — CBC WITH DIFFERENTIAL/PLATELET
Eosinophils Absolute: 0.3 10*3/uL (ref 0.0–0.7)
Eosinophils Relative: 5 % (ref 0–5)
Lymphs Abs: 2.1 10*3/uL (ref 0.7–4.0)
MCH: 28.4 pg (ref 26.0–34.0)
MCHC: 33.7 g/dL (ref 30.0–36.0)
MCV: 84.3 fL (ref 78.0–100.0)
Platelets: 327 10*3/uL (ref 150–400)
RBC: 4.51 MIL/uL (ref 3.87–5.11)
RDW: 13.9 % (ref 11.5–15.5)

## 2013-03-07 LAB — HEMOGLOBIN A1C
Hgb A1c MFr Bld: 5.3 % (ref <5.7)
Mean Plasma Glucose: 105 mg/dL (ref <117)

## 2013-03-10 ENCOUNTER — Ambulatory Visit (HOSPITAL_COMMUNITY)
Admission: RE | Admit: 2013-03-10 | Discharge: 2013-03-10 | Disposition: A | Discharge status: Home / Self Care | Payer: BC Managed Care – PPO | Source: Ambulatory Visit | Attending: Family Medicine | Admitting: Family Medicine

## 2013-03-10 ENCOUNTER — Ambulatory Visit (INDEPENDENT_AMBULATORY_CARE_PROVIDER_SITE_OTHER): Payer: BC Managed Care – PPO | Admitting: Family Medicine

## 2013-03-10 ENCOUNTER — Encounter (INDEPENDENT_AMBULATORY_CARE_PROVIDER_SITE_OTHER): Payer: Self-pay

## 2013-03-10 ENCOUNTER — Encounter: Payer: Self-pay | Admitting: Family Medicine

## 2013-03-10 ENCOUNTER — Other Ambulatory Visit (HOSPITAL_COMMUNITY)
Admission: RE | Admit: 2013-03-10 | Discharge: 2013-03-10 | Disposition: A | Discharge status: Home / Self Care | Payer: BC Managed Care – PPO | Source: Ambulatory Visit | Attending: Family Medicine | Admitting: Family Medicine

## 2013-03-10 VITALS — BP 112/78 | HR 72 | Resp 18 | Ht 64.5 in | Wt 304.0 lb

## 2013-03-10 DIAGNOSIS — Z0000 Encounter for general adult medical examination without abnormal findings: Secondary | ICD-10-CM

## 2013-03-10 DIAGNOSIS — M25562 Pain in left knee: Secondary | ICD-10-CM

## 2013-03-10 DIAGNOSIS — Z1151 Encounter for screening for human papillomavirus (HPV): Secondary | ICD-10-CM | POA: Insufficient documentation

## 2013-03-10 DIAGNOSIS — Z23 Encounter for immunization: Secondary | ICD-10-CM

## 2013-03-10 DIAGNOSIS — M25569 Pain in unspecified knee: Secondary | ICD-10-CM

## 2013-03-10 DIAGNOSIS — Z01419 Encounter for gynecological examination (general) (routine) without abnormal findings: Secondary | ICD-10-CM | POA: Insufficient documentation

## 2013-03-10 DIAGNOSIS — E669 Obesity, unspecified: Secondary | ICD-10-CM

## 2013-03-10 MED ORDER — PHENTERMINE HCL 37.5 MG PO TABS: 37.5000 mg | ORAL_TABLET | Freq: Every day | ORAL | Status: DC

## 2013-03-10 MED ORDER — KETOROLAC TROMETHAMINE 60 MG/2ML IM SOLN
60.0000 mg | Freq: Once | INTRAMUSCULAR | Status: AC
  Administered 2013-03-10: 60 mg via INTRAMUSCULAR

## 2013-03-10 MED ORDER — IBUPROFEN 800 MG PO TABS: 800.0000 mg | ORAL_TABLET | Freq: Three times a day (TID) | ORAL | Status: DC | PRN

## 2013-03-10 MED ORDER — PREDNISONE 5 MG PO TABS: 5.0000 mg | ORAL_TABLET | Freq: Two times a day (BID) | ORAL | Status: AC

## 2013-03-10 MED ORDER — METHYLPREDNISOLONE ACETATE 80 MG/ML IJ SUSP
80.0000 mg | Freq: Once | INTRAMUSCULAR | Status: AC
  Administered 2013-03-10: 80 mg via INTRAMUSCULAR

## 2013-03-10 NOTE — Progress Notes (Signed)
  Subjective:    Patient ID: Claire [name], female    DOB: 08/25/1980, 32 y.o.   MRN: [id number]  HPI Pt here for annual exam. C/o 3.5 week h/o left anterior knee pain, no h/o direct trauma or instability Ongoing weight gain a concern as she is generally otherwise healthy, wants help with medication and ready to commit to program of lifestyle change   Review of Systems See HPI Denies recent fever or chills. Denies sinus pressure, nasal congestion, ear pain or sore throat. Denies chest congestion, productive cough or wheezing. Denies chest pains, palpitations and leg swelling Denies abdominal pain, nausea, vomiting,diarrhea or constipation.   Denies dysuria, frequency, hesitancy or incontinence.  Denies headaches, seizures, numbness, or tingling. Denies depression, anxiety or insomnia. Denies skin break down or rash.        Objective:   Physical Exam  Pleasant morbidly obese female, alert and oriented x 3, in no cardio-pulmonary distress. Afebrile. HEENT No facial trauma or asymetry. Sinuses non tender.  EOMI, PERTL. External ears normal, tympanic membranes clear. Oropharynx moist, no exudate, good dentition. Neck: supple, no adenopathy,JVD or thyromegaly.No bruits.  Chest: Clear to ascultation bilaterally.No crackles or wheezes. Non tender to palpation  Breast: No asymetry,no masses. No nipple discharge or inversion. No axillary or supraclavicular adenopathy  Cardiovascular system; Heart sounds normal,  S1 and  S2 ,no S3.  No murmur, or thrill. Apical beat not displaced Peripheral pulses normal.  Abdomen: Soft, non tender, no organomegaly or masses. No bruits. Bowel sounds normal. No guarding, tenderness or rebound.  GU: External genitalia normal. No lesions. Vaginal canal normal.No discharge. Uterus normal size, no adnexal masses, no cervical motion or adnexal tenderness.  Musculoskeletal exam: Full ROM of spine, hips , shoulders and reduced in left   Knee with anterior tenderness. No deformity ,swelling or crepitus noted. No muscle wasting or atrophy.   Neurologic: Cranial nerves 2 to 12 intact. Power, tone ,sensation and reflexes normal throughout. No disturbance in gait. No tremor.  Skin: Intact, no ulceration, erythema , scaling or rash noted. Pigmentation normal throughout  Psych; Normal mood and affect. Judgement and concentration normal       Assessment & Plan:

## 2013-03-10 NOTE — Patient Instructions (Addendum)
F/u in 4 month, call if you need me before  Toradol 60mg   and depo medrol 80mg  IM in the office today for left knee pain and stiffness. X ray of left knee today   Reduce fried and fatty foods and red meat.Bad cholesterol slightly high  New is phentermine half daily to help with weight loss. Weight loss goal of 3 to 4 pounds per month It is important that you exercise regularly at least 30 minutes 5 times a week. If you develop chest pain, have severe difficulty breathing, or feel very tired, stop exercising immediately and seek medical attention    Ibuprofen and short course of prednisone sent in . Take ibuprofen for 5 days 3 times per day, then 1 daily as needed.  Flu vaccine today  I

## 2013-03-11 ENCOUNTER — Encounter: Payer: Self-pay | Admitting: Family Medicine

## 2013-03-14 DIAGNOSIS — Z0000 Encounter for general adult medical examination without abnormal findings: Secondary | ICD-10-CM | POA: Insufficient documentation

## 2013-03-14 NOTE — Assessment & Plan Note (Signed)
Deteriorated. Patient re-educated about  the importance of commitment to a  minimum of 150 minutes of exercise per week. The importance of healthy food choices with portion control discussed. Encouraged to start a food diary, count calories and to consider  joining a support group. Sample diet sheets offered. Goals set by the patient for the next several months.   Pt to start half phentermine daily

## 2013-03-14 NOTE — Assessment & Plan Note (Signed)
Uncontrolled, toradol and depo medrol inn office, xray , then short course of anti inflammatories. Weight loss needed

## 2013-03-14 NOTE — Assessment & Plan Note (Signed)
Annual exam as documented. Safe sex with condom use discussed. Weight loss goals established

## 2013-04-01 ENCOUNTER — Encounter: Payer: Self-pay | Admitting: Family Medicine

## 2013-04-02 ENCOUNTER — Other Ambulatory Visit: Payer: Self-pay | Admitting: Family Medicine

## 2013-04-02 MED ORDER — FIRST-DUKES MOUTHWASH MT SUSP: OROMUCOSAL | Status: DC

## 2013-04-02 MED ORDER — FLUCONAZOLE 150 MG PO TABS: ORAL_TABLET | ORAL | Status: AC

## 2013-04-15 ENCOUNTER — Encounter: Payer: Self-pay | Admitting: Family Medicine

## 2013-04-23 ENCOUNTER — Other Ambulatory Visit: Payer: Self-pay | Admitting: Family Medicine

## 2013-07-07 ENCOUNTER — Encounter: Payer: Self-pay | Admitting: Family Medicine

## 2013-07-07 ENCOUNTER — Ambulatory Visit (INDEPENDENT_AMBULATORY_CARE_PROVIDER_SITE_OTHER): Payer: BC Managed Care – PPO | Admitting: Family Medicine

## 2013-07-07 VITALS — BP 120/82 | HR 94 | Resp 16 | Wt 297.0 lb

## 2013-07-07 DIAGNOSIS — E785 Hyperlipidemia, unspecified: Secondary | ICD-10-CM

## 2013-07-07 DIAGNOSIS — M25562 Pain in left knee: Secondary | ICD-10-CM

## 2013-07-07 DIAGNOSIS — E669 Obesity, unspecified: Secondary | ICD-10-CM

## 2013-07-07 DIAGNOSIS — M25569 Pain in unspecified knee: Secondary | ICD-10-CM

## 2013-07-07 NOTE — Patient Instructions (Signed)
F/u in 4 months, call if you need me before  Continue daily phentermine (half tablet)  Aim for 1200 cal 5 days per week , max up to 1500 cals  For the other 2 days  Increase to  daily exercise

## 2013-07-11 DIAGNOSIS — E785 Hyperlipidemia, unspecified: Secondary | ICD-10-CM | POA: Insufficient documentation

## 2013-07-11 NOTE — Assessment & Plan Note (Signed)
Hyperlipidemia:Low fat diet discussed and encouraged.  Updated lab needed at/ before next visit.  

## 2013-07-11 NOTE — Assessment & Plan Note (Signed)
Improved. Pt applauded on succesful weight loss through lifestyle change, and encouraged to continue same. Weight loss goal set for the next several months.  

## 2013-07-11 NOTE — Progress Notes (Signed)
   Subjective:    Patient ID: Claire [name], female    DOB: 01/05/1981, 33 y.o.   MRN: [id number]  HPI The PT is here for follow up and re-evaluation of chronic medical conditions, medication management and review of any available recent lab and radiology data.  Preventive health is updated, specifically  Cancer screening and Immunization.    The PT denies any adverse reactions to current medications since the last visit.  There are no new concerns.  There are no specific complaints       Review of Systems See HPI Denies recent fever or chills. Denies sinus pressure, nasal congestion, ear pain or sore throat. Denies chest congestion, productive cough or wheezing. Denies chest pains, palpitations and leg swelling Denies abdominal pain, nausea, vomiting,diarrhea or constipation.   Denies dysuria, frequency, hesitancy or incontinence. Intermittenr left knee pain and  limitation in mobility. Denies headaches, seizures, numbness, or tingling. Denies depression, anxiety or insomnia. Denies skin break down or rash.        Objective:   Physical Exam  BP 120/82  Pulse 94  Resp 16  Wt 297 lb (134.718 kg)  SpO2 99% Patient alert and oriented and in no cardiopulmonary distress.  HEENT: No facial asymmetry, EOMI, no sinus tenderness,  oropharynx pink and moist.  Neck supple no adenopathy.  Chest: Clear to auscultation bilaterally.  CVS: S1, S2 no murmurs, no S3.  ABD: Soft non tender. Bowel sounds normal.  Ext: No edema  MS: Adequate ROM spine, shoulders, hips and knees.  Skin: Intact, no ulcerations or rash noted.  Psych: Good eye contact, normal affect. Memory intact not anxious or depressed appearing.  CNS: CN 2-12 intact, power, tone and sensation normal throughout.       Assessment & Plan:  Left anterior knee pain Improved, uses ibuprofen intermittently  OBESITY Improved. Pt applauded on succesful weight loss through lifestyle change, and encouraged to  continue same. Weight loss goal set for the next several months.   Dyslipidemia, goal LDL below 100 Hyperlipidemia:Low fat diet discussed and encouraged.  Updated lab needed at/ before next visit.

## 2013-07-11 NOTE — Assessment & Plan Note (Signed)
Improved, uses ibuprofen intermittently

## 2013-08-14 ENCOUNTER — Other Ambulatory Visit: Payer: Self-pay | Admitting: Family Medicine

## 2013-08-17 ENCOUNTER — Other Ambulatory Visit: Payer: Self-pay

## 2013-08-17 MED ORDER — LORATADINE 10 MG PO TABS: 10.0000 mg | ORAL_TABLET | Freq: Every day | ORAL | Status: DC

## 2013-08-27 ENCOUNTER — Other Ambulatory Visit: Payer: Self-pay | Admitting: Family Medicine

## 2013-08-27 ENCOUNTER — Encounter: Payer: Self-pay | Admitting: Family Medicine

## 2013-08-27 MED ORDER — HYDROCORTISONE ACETATE 10 % RE FOAM: 1.0000 | Freq: Two times a day (BID) | RECTAL | Status: DC

## 2013-11-05 ENCOUNTER — Other Ambulatory Visit: Payer: Self-pay | Admitting: Family Medicine

## 2013-11-08 ENCOUNTER — Ambulatory Visit: Payer: BC Managed Care – PPO | Admitting: Family Medicine

## 2013-11-29 ENCOUNTER — Ambulatory Visit (INDEPENDENT_AMBULATORY_CARE_PROVIDER_SITE_OTHER): Payer: BC Managed Care – PPO | Admitting: Family Medicine

## 2013-11-29 ENCOUNTER — Encounter: Payer: Self-pay | Admitting: Family Medicine

## 2013-11-29 VITALS — BP 124/80 | HR 76 | Resp 18 | Ht 64.0 in | Wt 281.0 lb

## 2013-11-29 DIAGNOSIS — E669 Obesity, unspecified: Secondary | ICD-10-CM

## 2013-11-29 DIAGNOSIS — E785 Hyperlipidemia, unspecified: Secondary | ICD-10-CM

## 2013-11-29 DIAGNOSIS — L259 Unspecified contact dermatitis, unspecified cause: Secondary | ICD-10-CM

## 2013-11-29 DIAGNOSIS — R7301 Impaired fasting glucose: Secondary | ICD-10-CM

## 2013-11-29 DIAGNOSIS — R5381 Other malaise: Secondary | ICD-10-CM

## 2013-11-29 DIAGNOSIS — R5383 Other fatigue: Secondary | ICD-10-CM

## 2013-11-29 DIAGNOSIS — L309 Dermatitis, unspecified: Secondary | ICD-10-CM | POA: Insufficient documentation

## 2013-11-29 MED ORDER — PHENTERMINE HCL 37.5 MG PO TABS: ORAL_TABLET | ORAL | Status: DC

## 2013-11-29 NOTE — Assessment & Plan Note (Signed)
Improved. Pt applauded on succesful weight loss through lifestyle change, and encouraged to continue same. Weight loss goal set for the next several months. Continue phentermine, half daily

## 2013-11-29 NOTE — Progress Notes (Signed)
   Subjective:    Patient ID: Claire [name], female    DOB: 07/24/1980, 33 y.o.   MRN: [id number]  HPI The PT is here for follow up and re-evaluation of chronic medical conditions, medication management and review of any available recent lab and radiology data.  Preventive health is updated, specifically  Cancer screening and Immunization.   The PT denies any adverse reactions to current medications since the last visit. Has done extremely with with weight loss with phentermine and denies any adverse side effects There are no new concerns.  C/o recurrent painful lesions in palms and on her  tongue sporadically, currently has outbreak will refer to derm    Review of Systems See HPI Denies recent fever or chills. Denies sinus pressure, nasal congestion, ear pain or sore throat. Denies chest congestion, productive cough or wheezing. Denies chest pains, palpitations and leg swelling Denies abdominal pain, nausea, vomiting,diarrhea or constipation.   Denies dysuria, frequency, hesitancy or incontinence. Denies joint pain, swelling and limitation in mobility. Denies headaches, seizures, numbness, or tingling. Denies depression, anxiety or insomnia.        Objective:   Physical Exam BP 124/80  Pulse 76  Resp 18  Ht 5\' 4"  (1.626 m)  Wt 281 lb (127.461 kg)  BMI 48.21 kg/m2  SpO2 99% Patient alert and oriented and in no cardiopulmonary distress.  HEENT: No facial asymmetry, EOMI,   oropharynx pink and moist.  Neck supple no JVD, no mass.  Chest: Clear to auscultation bilaterally.  CVS: S1, S2 no murmurs, no S3.Regular rate.  ABD: Soft non tender.   Ext: No edema  MS: Adequate ROM spine, shoulders, hips and knees.  Skin: Intact, small white lesions noted on palms , also ulcerated areas on her tongue Psych: Good eye contact, normal affect. Memory intact not anxious or depressed appearing.  CNS: CN 2-12 intact, power,  normal throughout.no focal deficits noted.          Assessment & Plan:  OBESITY Improved. Pt applauded on succesful weight loss through lifestyle change, and encouraged to continue same. Weight loss goal set for the next several months. Continue phentermine, half daily  Dyslipidemia, goal LDL below 100 Hyperlipidemia:Low fat diet discussed and encouraged.  Updated lab needed at/ before next visit.   Dermatitis Recurrent painful lesions on hands and on tongue, unclear etiology. Refer to dermatology for further eval and management

## 2013-11-29 NOTE — Patient Instructions (Signed)
F/U in 4 months,call if you need me before  It is important that you exercise regularly at least 30 minutes 5 times a week. If you develop chest pain, have severe difficulty breathing, or feel very tired, stop exercising immediately and seek medical attention    CONGRATS on healthy weight loss, aim for 1500 cals per day, hopefully try to maintain 4 pounds per month of weight loss  You  Are referred to dermatology   Fasting lipid, chem 7, hBA1C, TSH and CBC end November

## 2013-12-04 NOTE — Assessment & Plan Note (Signed)
Recurrent painful lesions on hands and on tongue, unclear etiology. Refer to dermatology for further eval and management

## 2013-12-04 NOTE — Assessment & Plan Note (Signed)
Hyperlipidemia:Low fat diet discussed and encouraged.  Updated lab needed at/ before next visit.  

## 2013-12-22 ENCOUNTER — Encounter: Payer: Self-pay | Admitting: Family Medicine

## 2014-02-26 ENCOUNTER — Other Ambulatory Visit: Payer: Self-pay | Admitting: Family Medicine

## 2014-03-26 ENCOUNTER — Other Ambulatory Visit: Payer: Self-pay | Admitting: Family Medicine

## 2014-04-18 ENCOUNTER — Ambulatory Visit: Payer: BC Managed Care – PPO | Admitting: Family Medicine

## 2014-06-22 ENCOUNTER — Encounter: Payer: Self-pay | Admitting: Family Medicine

## 2014-06-22 ENCOUNTER — Other Ambulatory Visit: Payer: Self-pay | Admitting: Family Medicine

## 2014-06-22 MED ORDER — BUTALBITAL-APAP-CAFFEINE 50-500-40 MG PO TABS: ORAL_TABLET | ORAL | Status: DC

## 2014-07-22 ENCOUNTER — Other Ambulatory Visit: Payer: Self-pay | Admitting: Family Medicine

## 2014-07-22 LAB — CBC
HEMATOCRIT: 38.7 % (ref 36.0–46.0)
Hemoglobin: 13 g/dL (ref 12.0–15.0)
MCH: 29.1 pg (ref 26.0–34.0)
MCHC: 33.6 g/dL (ref 30.0–36.0)
MCV: 86.8 fL (ref 78.0–100.0)
MPV: 10.1 fL (ref 8.6–12.4)
Platelets: 364 10*3/uL (ref 150–400)
RBC: 4.46 MIL/uL (ref 3.87–5.11)
RDW: 13.7 % (ref 11.5–15.5)
WBC: 5.8 10*3/uL (ref 4.0–10.5)

## 2014-07-22 LAB — LIPID PANEL
CHOL/HDL RATIO: 3.8 ratio
Cholesterol: 201 mg/dL — ABNORMAL HIGH (ref 0–200)
HDL: 53 mg/dL (ref >=46)
LDL CALC: 124 mg/dL — AB (ref 0–99)
Triglycerides: 118 mg/dL (ref <150)
VLDL: 24 mg/dL (ref 0–40)

## 2014-07-22 LAB — BASIC METABOLIC PANEL
BUN: 13 mg/dL (ref 6–23)
CO2: 25 mEq/L (ref 19–32)
Calcium: 8.9 mg/dL (ref 8.4–10.5)
Chloride: 104 mEq/L (ref 96–112)
Creat: 0.76 mg/dL (ref 0.50–1.10)
Glucose, Bld: 85 mg/dL (ref 70–99)
POTASSIUM: 4.5 meq/L (ref 3.5–5.3)
SODIUM: 139 meq/L (ref 135–145)

## 2014-07-22 LAB — TSH: TSH: 1.283 u[IU]/mL (ref 0.350–4.500)

## 2014-07-22 LAB — HEMOGLOBIN A1C
HEMOGLOBIN A1C: 5.5 % (ref <5.7)
Mean Plasma Glucose: 111 mg/dL (ref <117)

## 2014-07-28 ENCOUNTER — Encounter: Payer: Self-pay | Admitting: Family Medicine

## 2014-07-28 ENCOUNTER — Ambulatory Visit (INDEPENDENT_AMBULATORY_CARE_PROVIDER_SITE_OTHER): Payer: BC Managed Care – PPO | Admitting: Family Medicine

## 2014-07-28 VITALS — BP 124/68 | HR 100 | Resp 18 | Ht 63.0 in | Wt 300.0 lb

## 2014-07-28 DIAGNOSIS — F32A Depression, unspecified: Secondary | ICD-10-CM

## 2014-07-28 DIAGNOSIS — F322 Major depressive disorder, single episode, severe without psychotic features: Secondary | ICD-10-CM | POA: Insufficient documentation

## 2014-07-28 DIAGNOSIS — F4541 Pain disorder exclusively related to psychological factors: Secondary | ICD-10-CM

## 2014-07-28 DIAGNOSIS — F329 Major depressive disorder, single episode, unspecified: Secondary | ICD-10-CM

## 2014-07-28 DIAGNOSIS — E669 Obesity, unspecified: Secondary | ICD-10-CM | POA: Diagnosis not present

## 2014-07-28 MED ORDER — PHENTERMINE HCL 37.5 MG PO TABS: ORAL_TABLET | ORAL | Status: DC

## 2014-07-28 MED ORDER — VENLAFAXINE HCL ER 37.5 MG PO CP24: 37.5000 mg | ORAL_CAPSULE | Freq: Every day | ORAL | Status: DC

## 2014-07-28 NOTE — Progress Notes (Signed)
   Subjective:    Patient ID: Claire [name], female    DOB: 02/24/1981, 34 y.o.   MRN: [id number]  HPI The PT is here for follow up and re-evaluation of chronic medical conditions, medication management and review of any available recent lab and radiology data.  Preventive health is updated, specifically  Cancer screening and Immunization.   1 month h/o increased  Headache frequency on avg 2 to 3 per week , triggered by increased stress at the job, often feels overwhelmed as job security a major issue, denies any fever chills or sinus pressure or sore throat. Had been doing well with behavioral change for weight loss, however, has been out of medication and stress has increased , needs to re address this from scratch once more, somewhat frustrated. Depression screen is positive , though she is neither suicidal or homicidal    Review of Systems See HPI Denies recent fever or chills. Denies sinus pressure, nasal congestion, ear pain or sore throat. Denies chest congestion, productive cough or wheezing. Denies chest pains, palpitations and leg swelling Denies abdominal pain, nausea, vomiting,diarrhea or constipation.   Denies dysuria, frequency, hesitancy or incontinence. Denies joint pain, swelling and limitation in mobility. . Denies skin break down or rash.        Objective:   Physical Exam  BP 124/68 mmHg  Pulse 100  Resp 18  Ht 5\' 3"  (1.6 m)  Wt 300 lb (136.079 kg)  BMI 53.16 kg/m2  SpO2 94% Patient alert and oriented and in no cardiopulmonary distress.  HEENT: No facial asymmetry, EOMI,   oropharynx pink and moist.  Neck supple no JVD, no mass.  Chest: Clear to auscultation bilaterally.  CVS: S1, S2 no murmurs, no S3.Regular rate.  ABD: Soft non tender.   Ext: No edema  MS: Adequate ROM spine, shoulders, hips and knees.  Skin: Intact, no ulcerations or rash noted.  Psych: Good eye contact, normal affect. Memory intact mildly anxious and  depressed  appearing.  CNS: CN 2-12 intact, power,  normal throughout.no focal deficits noted.       Assessment & Plan:  Stress headaches New onset, pt was able to verbalize her stressors.Fioricet sent in for as needed use for uncontrolled headache    Depression New diagnosis , start effexor   Morbid obesity Deteriorated. Patient re-educated about  the importance of commitment to a  minimum of 150 minutes of exercise per week.  The importance of healthy food choices with portion control discussed. Encouraged to start a food diary, count calories and to consider  joining a support group. Sample diet sheets offered. Goals set by the patient for the next several months.   Weight /BMI 07/28/2014 11/29/2013 07/07/2013  WEIGHT 300 lb 281 lb 297 lb  HEIGHT 5\' 3"  5\' 4"  -  BMI 53.16 kg/m2 48.21 kg/m2 50.21 kg/m2    Current exercise per week **90* minutes. \

## 2014-07-28 NOTE — Patient Instructions (Addendum)
F/u in 2  months, call if you need me before    blood pressure is good  New for depression is effexor one daily, do not abruptly stop    Resume half phentermine daily  Labs will be addressed

## 2014-08-01 ENCOUNTER — Ambulatory Visit: Payer: Self-pay | Admitting: Family Medicine

## 2014-08-18 ENCOUNTER — Encounter: Payer: Self-pay | Admitting: Family Medicine

## 2014-08-23 ENCOUNTER — Other Ambulatory Visit: Payer: Self-pay | Admitting: Family Medicine

## 2014-09-06 IMAGING — CR DG KNEE COMPLETE 4+V*L*
4 series · 4 of 4 positions shown · non-contrast
Comparison: 01/21/2007

CLINICAL DATA: Left knee pain

EXAM:
LEFT KNEE - COMPLETE 4+ VIEW

[view not recorded (1 of 4)]
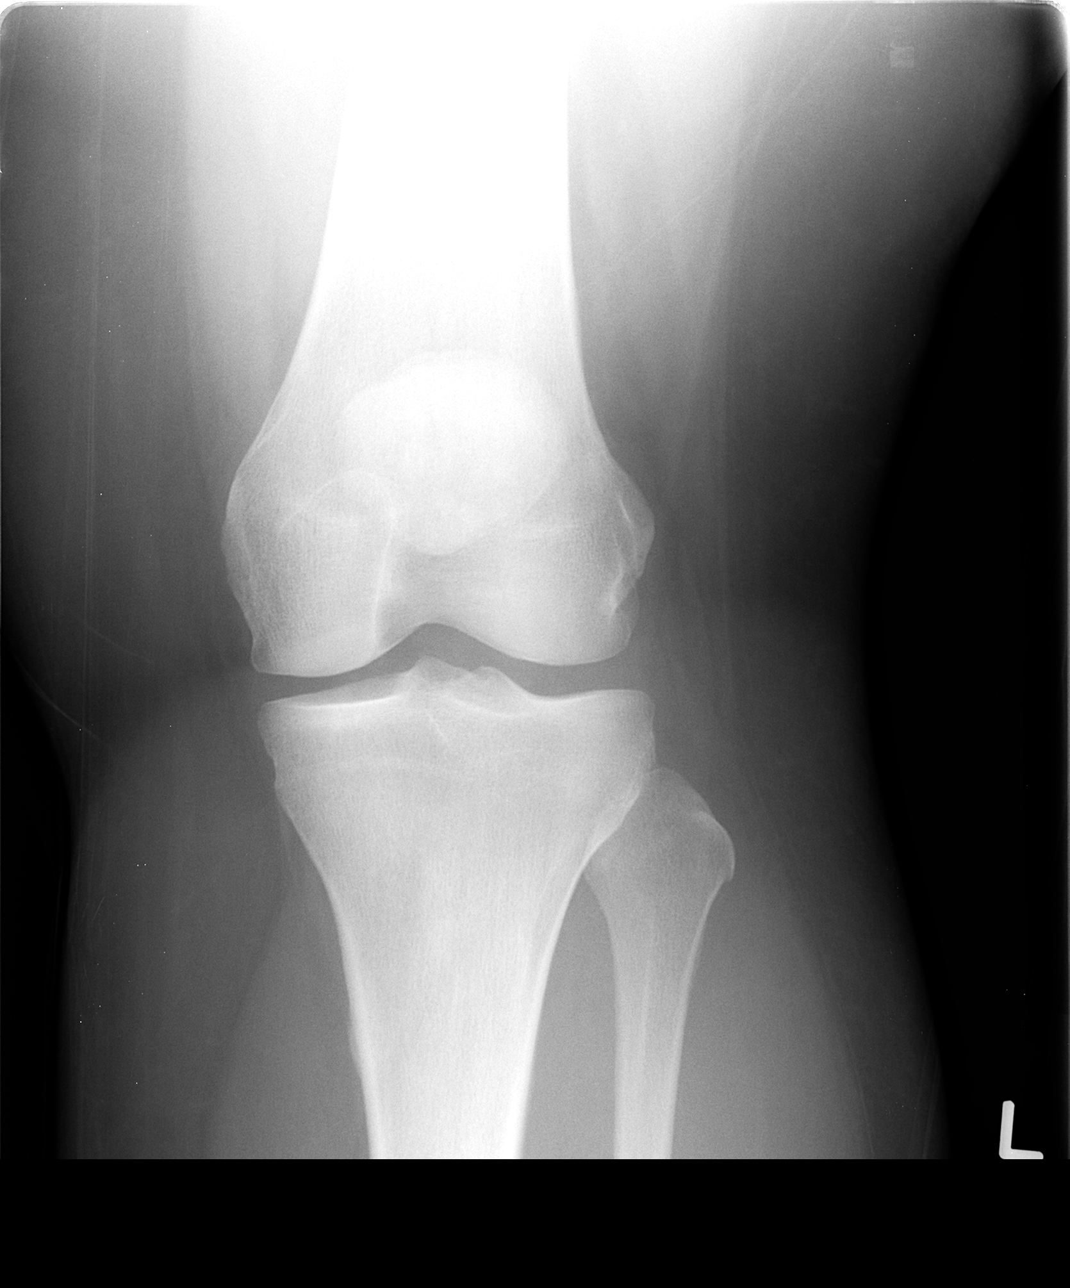

[view not recorded (2 of 4)]
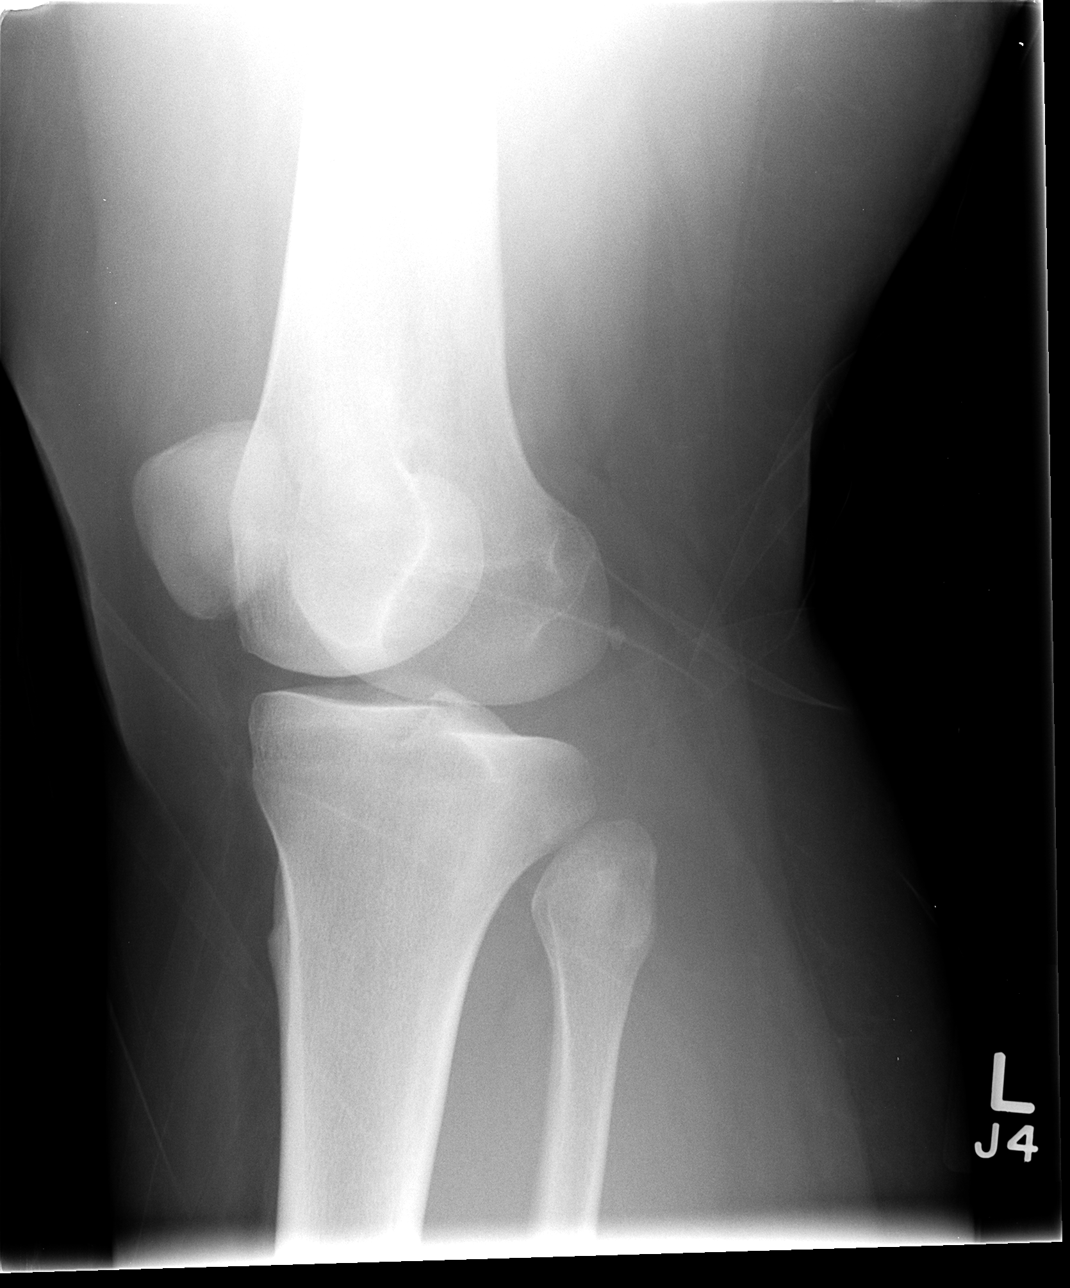

[view not recorded (3 of 4)]
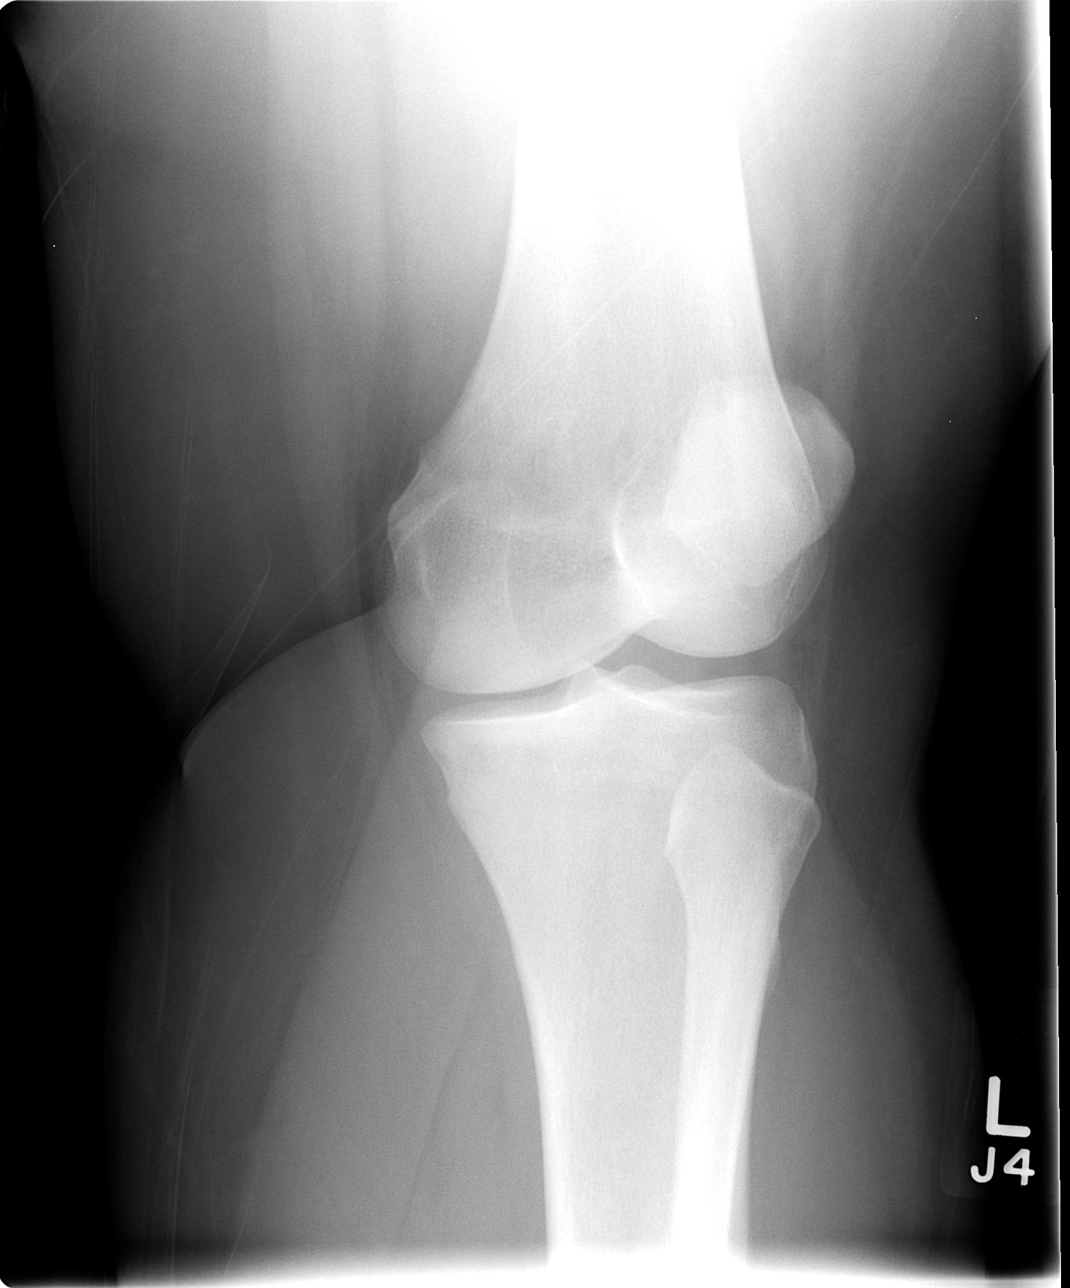

[view not recorded (4 of 4)]
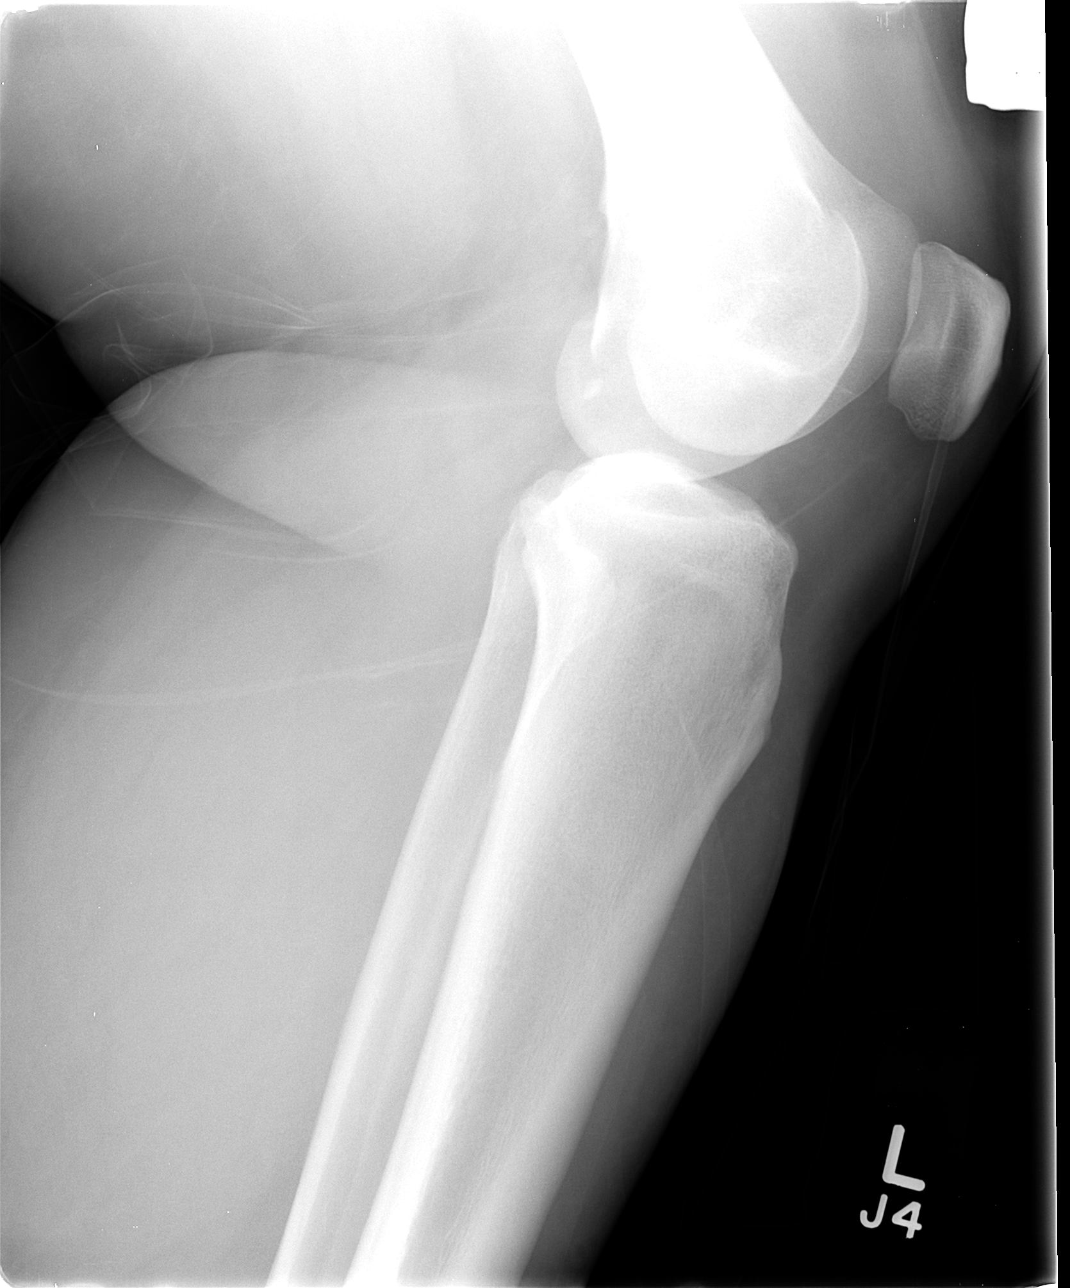

[4 of 4 positions shown; findings below may reference images not displayed]

FINDINGS: There is no evidence of fracture, dislocation, or joint effusion.
There is no evidence of arthropathy or other focal bone abnormality.
Soft tissues are unremarkable.
IMPRESSION: No acute abnormality noted.

## 2014-09-10 ENCOUNTER — Other Ambulatory Visit: Payer: Self-pay | Admitting: Family Medicine

## 2014-09-11 DIAGNOSIS — G44009 Cluster headache syndrome, unspecified, not intractable: Secondary | ICD-10-CM | POA: Insufficient documentation

## 2014-09-11 NOTE — Assessment & Plan Note (Signed)
Deteriorated. Patient re-educated about  the importance of commitment to a  minimum of 150 minutes of exercise per week.  The importance of healthy food choices with portion control discussed. Encouraged to start a food diary, count calories and to consider  joining a support group. Sample diet sheets offered. Goals set by the patient for the next several months.   Weight /BMI 07/28/2014 11/29/2013 07/07/2013  WEIGHT 300 lb 281 lb 297 lb  HEIGHT 5\' 3"  5\' 4"  -  BMI 53.16 kg/m2 48.21 kg/m2 50.21 kg/m2    Current exercise per week **90* minutes. \

## 2014-09-11 NOTE — Assessment & Plan Note (Signed)
New diagnosis , start effexor

## 2014-09-11 NOTE — Assessment & Plan Note (Addendum)
New onset, pt was able to verbalize her stressors.Fioricet sent in for as needed use for uncontrolled headache

## 2014-10-10 ENCOUNTER — Ambulatory Visit: Payer: BC Managed Care – PPO | Admitting: Family Medicine

## 2014-11-09 ENCOUNTER — Encounter: Payer: Self-pay | Admitting: Family Medicine

## 2014-11-09 ENCOUNTER — Other Ambulatory Visit: Payer: Self-pay | Admitting: Family Medicine

## 2014-11-09 ENCOUNTER — Ambulatory Visit (INDEPENDENT_AMBULATORY_CARE_PROVIDER_SITE_OTHER): Payer: BC Managed Care – PPO | Admitting: Family Medicine

## 2014-11-09 VITALS — BP 122/78 | HR 78 | Resp 16 | Ht 63.0 in | Wt 308.0 lb

## 2014-11-09 DIAGNOSIS — E785 Hyperlipidemia, unspecified: Secondary | ICD-10-CM

## 2014-11-09 DIAGNOSIS — N3001 Acute cystitis with hematuria: Secondary | ICD-10-CM

## 2014-11-09 DIAGNOSIS — F329 Major depressive disorder, single episode, unspecified: Secondary | ICD-10-CM

## 2014-11-09 DIAGNOSIS — F32A Depression, unspecified: Secondary | ICD-10-CM

## 2014-11-09 DIAGNOSIS — N3000 Acute cystitis without hematuria: Secondary | ICD-10-CM | POA: Insufficient documentation

## 2014-11-09 LAB — POCT URINALYSIS DIPSTICK
Bilirubin, UA: NEGATIVE
Glucose, UA: NEGATIVE
Ketones, UA: NEGATIVE
Nitrite, UA: NEGATIVE
PH UA: 5.5
Protein, UA: NEGATIVE
SPEC GRAV UA: 1.025
Urobilinogen, UA: 0.2

## 2014-11-09 MED ORDER — CIPROFLOXACIN HCL 500 MG PO TABS: 500.0000 mg | ORAL_TABLET | Freq: Two times a day (BID) | ORAL | Status: DC

## 2014-11-09 MED ORDER — VENLAFAXINE HCL ER 75 MG PO CP24: 75.0000 mg | ORAL_CAPSULE | Freq: Every day | ORAL | Status: DC

## 2014-11-09 MED ORDER — FLUCONAZOLE 150 MG PO TABS: ORAL_TABLET | ORAL | Status: DC

## 2014-11-09 NOTE — Patient Instructions (Signed)
F/u in 4 months,   You need a higher dose  Dose of effexor , this is increased, and you are referred to dr Tenny Crawoss   It is important that you exercise regularly at least 30 minutes 7 times a week. If you develop chest pain, have severe difficulty breathing, or feel very tired, stop exercising immediately and seek medical attention .   UTI is treated  Increase family and friend time

## 2014-11-10 ENCOUNTER — Other Ambulatory Visit: Payer: Self-pay

## 2014-11-10 LAB — MICROALBUMIN / CREATININE URINE RATIO
Creatinine, Urine: 231.1 mg/dL
MICROALB/CREAT RATIO: 5.2 mg/g (ref 0.0–30.0)
Microalb, Ur: 1.2 mg/dL (ref <2.0)

## 2014-11-11 LAB — URINE CULTURE

## 2014-11-13 NOTE — Assessment & Plan Note (Signed)
Hyperlipidemia:Low fat diet discussed and encouraged.   Lipid Panel  Lab Results  Component Value Date   CHOL 201* 07/22/2014   HDL 53 07/22/2014   LDLCALC 124* 07/22/2014   TRIG 118 07/22/2014   CHOLHDL 3.8 07/22/2014   Uncontrolled , encouraged to reduce fat in diet

## 2014-11-13 NOTE — Assessment & Plan Note (Addendum)
Deteriorated.Depression needs to be adequately treated before this will improve. Patient re-educated about  the importance of commitment to a  minimum of 150 minutes of exercise per week.  The importance of healthy food choices with portion control discussed. Encouraged to start a food diary, count calories and to consider  joining a support group. Sample diet sheets offered. Goals set by the patient for the next several months.   Weight /BMI 11/09/2014 07/28/2014 11/29/2013  WEIGHT 308 lb 300 lb 281 lb  HEIGHT 5\' 3"  5\' 3"  5\' 4"   BMI 54.57 kg/m2 53.16 kg/m2 48.21 kg/m2    Current exercise per week  30 minutes.

## 2014-11-13 NOTE — Assessment & Plan Note (Signed)
Untreated, no significant response to effexor, pt very de motivated and becoming socially withdrawn which is not her normal self. Not suicidal or homicidal, but does admit to a lot of adjustrment issues since the birth of her younger sister's child, and her family, though close knot, do not appear to realize the fact that she is depressed. Will increase med dose and refer to psych for help, soonest available. She is also encouraged to push herself to resume exercise as well as to become more involved especially with her family and also reach out to her friends more

## 2014-11-13 NOTE — Assessment & Plan Note (Signed)
Symptomatic with abnormal UA, will treat empirically

## 2014-11-13 NOTE — Progress Notes (Signed)
Claire [name]     MRN: [id number]      DOB: [birth year]   HPI Claire [name] is here for follow up and re-evaluation of chronic medical conditions, medication management and review of any available recent lab and radiology data.  Preventive health is updated, specifically  Cancer screening and Immunization.   The PT denies any adverse reactions to current medications since the last visit.  Acute onset of frequency and dysuria, denies flank pain, nausea , fever or chills. Not much improvement seen on antidepressant, notes that she is becoming increasingly withdrawn, and very de motivated , even as far as exercise and attempts at weight loss which was initially going well,  She has stopped the phentermine appropriately Not suicidal or homicidal  ROS Denies recent fever or chills. Denies sinus pressure, nasal congestion, ear pain or sore throat. Denies chest congestion, productive cough or wheezing. Denies chest pains, palpitations and leg swelling Denies abdominal pain, nausea, vomiting,diarrhea or constipation.   2 day h/o  dysuria, frequency,  Denies joint pain, swelling and limitation in mobility. Denies headaches, seizures, numbness, or tingling. Denies depression, anxiety or insomnia. Denies skin break down or rash.   PE  BP 122/78 mmHg  Pulse 78  Resp 16  Ht  (1.6 m)  Wt 308 lb (139.708 kg)  BMI 54.57 kg/m2  SpO2 100%  Patient alert and oriented and in no cardiopulmonary distress.  HEENT: No facial asymmetry, EOMI,   oropharynx pink and moist.  Neck supple no JVD, no mass.  Chest: Clear to auscultation bilaterally.  CVS: S1, S2 no murmurs, no S3.Regular rate.  r.   Ext: No edema  MS: Adequate ROM spine, shoulders, hips and knees.  Skin: Intact, no ulcerations or rash noted.  Psych: Good eye contact, flat  affect. Memory intact not anxious but  depressed appearing.  CNS: CN 2-12 intact, power,  normal throughout.no focal deficits noted.   Assessment &  Plan   Depression Untreated, no significant response to effexor, pt very de motivated and becoming socially withdrawn which is not her normal self. Not suicidal or homicidal, but does admit to a lot of adjustrment issues since the birth of her younger sister's child, and her family, though close knot, do not appear to realize the fact that she is depressed. Will increase med dose and refer to psych for help, soonest available. She is also encouraged to push herself to resume exercise as well as to become more involved especially with her family and also reach out to her friends more  Morbid obesity Deteriorated.Depression needs to be adequately treated before this will improve. Patient re-educated about  the importance of commitment to a  minimum of 150 minutes of exercise per week.  The importance of healthy food choices with portion control discussed. Encouraged to start a food diary, count calories and to consider  joining a support group. Sample diet sheets offered. Goals set by the patient for the next several months.   Weight /BMI 11/09/2014 07/28/2014 11/29/2013  WEIGHT 308 lb 300 lb 281 lb  HEIGHT     BMI 54.57 kg/m2 53.16 kg/m2 48.21 kg/m2    Current exercise per week  30 minutes.   Acute cystitis Symptomatic with abnormal UA, will treat empirically  Dyslipidemia, goal LDL below 100 Hyperlipidemia:Low fat diet discussed and encouraged.   Lipid Panel  Lab Results  Component Value Date   CHOL 201* 07/22/2014   HDL 53 07/22/2014   LDLCALC 124*  07/22/2014   TRIG 118 07/22/2014   CHOLHDL 3.8 07/22/2014   Uncontrolled , encouraged to reduce fat in diet

## 2014-11-22 ENCOUNTER — Other Ambulatory Visit: Payer: Self-pay | Admitting: Family Medicine

## 2015-01-18 ENCOUNTER — Encounter: Payer: Self-pay | Admitting: Family Medicine

## 2015-01-23 ENCOUNTER — Telehealth: Payer: Self-pay | Admitting: Family Medicine

## 2015-01-23 MED ORDER — TOPIRAMATE 50 MG PO TABS: 50.0000 mg | ORAL_TABLET | Freq: Every day | ORAL | Status: DC

## 2015-01-23 MED ORDER — SUMATRIPTAN SUCCINATE 100 MG PO TABS: ORAL_TABLET | ORAL | Status: DC

## 2015-01-23 MED ORDER — IBUPROFEN 800 MG PO TABS: ORAL_TABLET | ORAL | Status: DC

## 2015-01-23 NOTE — Telephone Encounter (Signed)
Called and spoke directly with pt, had migraine headache by description worst ever, from 09/15 for 4 days. Fioricet did nothing, excedrin migraine seemed to help. Still has not heard from mental health but will call, doing better but wants to go, she will call. Knows that I am sending in 3 new meds for migraine, info on migraine is to be mailed to her  Pls print and mail info on migraine headaches to [facility], and fax scripts , I spoke to her today

## 2015-01-23 NOTE — Telephone Encounter (Signed)
Faxed in rx's and mailed info on migraines

## 2015-02-18 ENCOUNTER — Other Ambulatory Visit: Payer: Self-pay | Admitting: Family Medicine

## 2015-02-27 ENCOUNTER — Encounter: Payer: Self-pay | Admitting: Family Medicine

## 2015-03-01 ENCOUNTER — Ambulatory Visit (INDEPENDENT_AMBULATORY_CARE_PROVIDER_SITE_OTHER): Payer: BC Managed Care – PPO | Admitting: Family Medicine

## 2015-03-01 ENCOUNTER — Encounter: Payer: Self-pay | Admitting: Family Medicine

## 2015-03-01 VITALS — BP 114/78 | HR 100 | Temp 98.9°F | Resp 18 | Ht 63.0 in | Wt 303.0 lb

## 2015-03-01 DIAGNOSIS — Z23 Encounter for immunization: Secondary | ICD-10-CM

## 2015-03-01 DIAGNOSIS — M7731 Calcaneal spur, right foot: Secondary | ICD-10-CM

## 2015-03-01 DIAGNOSIS — M545 Low back pain, unspecified: Secondary | ICD-10-CM

## 2015-03-01 DIAGNOSIS — J3089 Other allergic rhinitis: Secondary | ICD-10-CM | POA: Diagnosis not present

## 2015-03-01 DIAGNOSIS — F4541 Pain disorder exclusively related to psychological factors: Secondary | ICD-10-CM | POA: Diagnosis not present

## 2015-03-01 DIAGNOSIS — F329 Major depressive disorder, single episode, unspecified: Secondary | ICD-10-CM

## 2015-03-01 DIAGNOSIS — F32A Depression, unspecified: Secondary | ICD-10-CM

## 2015-03-01 DIAGNOSIS — G44009 Cluster headache syndrome, unspecified, not intractable: Secondary | ICD-10-CM | POA: Diagnosis not present

## 2015-03-01 MED ORDER — PREDNISONE 5 MG (21) PO TBPK: ORAL_TABLET | ORAL | Status: DC

## 2015-03-01 MED ORDER — AZELASTINE HCL 0.1 % NA SOLN: 2.0000 | Freq: Two times a day (BID) | NASAL | Status: DC

## 2015-03-01 MED ORDER — PREDNISONE 5 MG PO TABS: 5.0000 mg | ORAL_TABLET | Freq: Two times a day (BID) | ORAL | Status: AC

## 2015-03-01 MED ORDER — METHYLPREDNISOLONE ACETATE 80 MG/ML IJ SUSP
80.0000 mg | Freq: Once | INTRAMUSCULAR | Status: AC
  Administered 2015-03-01: 80 mg via INTRAMUSCULAR

## 2015-03-01 MED ORDER — TOPIRAMATE 50 MG PO TABS: 50.0000 mg | ORAL_TABLET | Freq: Two times a day (BID) | ORAL | Status: DC

## 2015-03-01 MED ORDER — ONDANSETRON HCL 4 MG PO TABS: 4.0000 mg | ORAL_TABLET | Freq: Every day | ORAL | Status: DC | PRN

## 2015-03-01 NOTE — Patient Instructions (Signed)
F/u as before  Flu vaccine today  Depo medrol in office for allergies New daily for allergies, continue claritin is astelin nose spray Prednisone sent for 5 days for allergies  Increase topamax to twice daily for headache prevention   OK to take ibuprofen 800 mg and immitrex once together at headache onset

## 2015-03-01 NOTE — Progress Notes (Signed)
Subjective:    Patient ID: Claire [name], female    DOB: 10/08/1980, 34 y.o.   MRN: [id number]  HPI Runny nose and scratchy throat 2 days ago, now productive cough , excess causing head to hurt, no fever or chills, ear, pressure, clear mucus. 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 Has had chronic c/o knee and ankle pain and c/o ongoing joint and back pain if she wears heels, will write a letter of support on her behalf  Flu shot yes Migraine 2 in past 2 months, , perimenstrual  and stress, some minor, will inc topamax and add zofran for nausea   No consistent lifestyle change for weight loss, gaining weight , will re address as able Improved depression on medication change at last visit  Review of Systems See HPI Denies recent fever or chills.  Denies chest congestion, productive cough or wheezing. Denies chest pains, palpitations and leg swelling Denies abdominal pain, nausea, vomiting,diarrhea or constipation.   Denies dysuria, frequency, hesitancy or incontinence. Denies  seizures, numbness, or tingling.  Denies skin break down or rash.        Objective:   Physical Exam  BP 114/78 mmHg  Pulse 100  Temp(Src) 98.9 F (37.2 C)  Resp 18  Ht 5\' 3"  (1.6 m)  Wt 303 lb (137.44 kg)  BMI 53.69 kg/m2  SpO2 98% Patient alert and oriented and in no cardiopulmonary distress.  HEENT: No facial asymmetry, EOMI,   oropharynx pink and moist.  Neck supple no JVD, no mass. No sinus tenderness. Erythema and edema of nasal mucosa, TM clear, bilateral conjunctival injection with excess watery drainage Chest: Clear to auscultation bilaterally.  CVS: S1, S2 no murmurs, no S3.Regular rate.  ABD: Soft non tender.   Ext: No edema  MS: Adequate though reduced  ROM spine, and knees.  Skin: Intact, no ulcerations or rash noted.  Psych: Good eye contact, normal affect. Memory intact not anxious or depressed  appearing.  CNS: CN 2-12 intact, power,  normal throughout.no focal deficits noted.       Assessment & Plan:  Allergic rhinitis Uncontrolled, depo medrol in office, short course of oral steroids and astelin, continue claritin  Migraine-cluster headache syndrome Improved in terms of frequency and severity. Still need to increase dose of topamax to improve prophylactic effect, also pt advised to take both ibuprofen and immitrex at onset  Morbid obesity Unchanged. Patient re-educated about  the importance of commitment to a  minimum of 150 minutes of exercise per week.  The importance of healthy food choices with portion control discussed. Encouraged to start a food diary, count calories and to consider  joining a support group. Sample diet sheets offered. Goals set by the patient for the next several months.   Weight /BMI 03/01/2015 11/09/2014 07/28/2014  WEIGHT 303 lb 308 lb 300 lb  HEIGHT 5\' 3"  5\' 3"  5\' 3"   BMI 53.69 kg/m2 54.57 kg/m2 53.16 kg/m2    Current exercise per week 90 minutes.   Depression Improved with recent increase in medication dose, continue same, pt states no longer needs psych at this time  Backache Chronic low back ache after standing for over 20 mins approximately, aggaravated by wearing heels, will write a note of support for pt to be allowed to wear tennis shoes  Heel spur Right heel spur , a cause of pain on standing for over 30 mins , aggravated by wearing  heels, letter of support to wear tennis shoes written on pt's behalf

## 2015-03-10 DIAGNOSIS — J309 Allergic rhinitis, unspecified: Secondary | ICD-10-CM | POA: Insufficient documentation

## 2015-03-10 DIAGNOSIS — M7730 Calcaneal spur, unspecified foot: Secondary | ICD-10-CM | POA: Insufficient documentation

## 2015-03-10 DIAGNOSIS — M549 Dorsalgia, unspecified: Secondary | ICD-10-CM | POA: Insufficient documentation

## 2015-03-10 NOTE — Assessment & Plan Note (Signed)
Improved with recent increase in medication dose, continue same, pt states no longer needs psych at this time

## 2015-03-10 NOTE — Assessment & Plan Note (Signed)
Improved in terms of frequency and severity. Still need to increase dose of topamax to improve prophylactic effect, also pt advised to take both ibuprofen and immitrex at onset

## 2015-03-10 NOTE — Assessment & Plan Note (Signed)
Chronic low back ache after standing for over 20 mins approximately, aggaravated by wearing heels, will write a note of support for pt to be allowed to wear tennis shoes

## 2015-03-10 NOTE — Assessment & Plan Note (Addendum)
Uncontrolled, depo medrol in office, short course of oral steroids and astelin, continue claritin

## 2015-03-10 NOTE — Assessment & Plan Note (Signed)
Unchanged. Patient re-educated about  the importance of commitment to a  minimum of 150 minutes of exercise per week.  The importance of healthy food choices with portion control discussed. Encouraged to start a food diary, count calories and to consider  joining a support group. Sample diet sheets offered. Goals set by the patient for the next several months.   Weight /BMI 03/01/2015 11/09/2014 07/28/2014  WEIGHT 303 lb 308 lb 300 lb  HEIGHT 5\' 3"  5\' 3"  5\' 3"   BMI 53.69 kg/m2 54.57 kg/m2 53.16 kg/m2    Current exercise per week 90 minutes.

## 2015-03-10 NOTE — Assessment & Plan Note (Signed)
Right heel spur , a cause of pain on standing for over 30 mins , aggravated by wearing heels, letter of support to wear tennis shoes written on pt's behalf

## 2015-03-21 ENCOUNTER — Other Ambulatory Visit: Payer: Self-pay | Admitting: Family Medicine

## 2015-04-10 ENCOUNTER — Ambulatory Visit: Payer: BC Managed Care – PPO | Admitting: Family Medicine

## 2015-05-20 ENCOUNTER — Other Ambulatory Visit: Payer: Self-pay | Admitting: Family Medicine

## 2015-06-14 ENCOUNTER — Encounter: Payer: Self-pay | Admitting: Family Medicine

## 2015-06-24 ENCOUNTER — Other Ambulatory Visit: Payer: Self-pay | Admitting: Family Medicine

## 2015-08-30 ENCOUNTER — Encounter: Payer: Self-pay | Admitting: Family Medicine

## 2015-08-30 ENCOUNTER — Other Ambulatory Visit: Payer: Self-pay | Admitting: Family Medicine

## 2015-09-05 ENCOUNTER — Ambulatory Visit (INDEPENDENT_AMBULATORY_CARE_PROVIDER_SITE_OTHER): Payer: BC Managed Care – PPO | Admitting: Family Medicine

## 2015-09-05 ENCOUNTER — Encounter: Payer: Self-pay | Admitting: Family Medicine

## 2015-09-05 VITALS — BP 118/76 | HR 97 | Temp 98.6°F | Resp 18 | Ht 63.0 in | Wt 309.0 lb

## 2015-09-05 DIAGNOSIS — F329 Major depressive disorder, single episode, unspecified: Secondary | ICD-10-CM | POA: Diagnosis not present

## 2015-09-05 DIAGNOSIS — J3089 Other allergic rhinitis: Secondary | ICD-10-CM

## 2015-09-05 DIAGNOSIS — H6590 Unspecified nonsuppurative otitis media, unspecified ear: Secondary | ICD-10-CM | POA: Insufficient documentation

## 2015-09-05 DIAGNOSIS — H65191 Other acute nonsuppurative otitis media, right ear: Secondary | ICD-10-CM | POA: Diagnosis not present

## 2015-09-05 DIAGNOSIS — F32A Depression, unspecified: Secondary | ICD-10-CM

## 2015-09-05 DIAGNOSIS — G4452 New daily persistent headache (NDPH): Secondary | ICD-10-CM | POA: Diagnosis not present

## 2015-09-05 MED ORDER — KETOROLAC TROMETHAMINE 60 MG/2ML IM SOLN
60.0000 mg | Freq: Once | INTRAMUSCULAR | Status: AC
  Administered 2015-09-05: 60 mg via INTRAMUSCULAR

## 2015-09-05 MED ORDER — PREDNISONE 5 MG (21) PO TBPK: 5.0000 mg | ORAL_TABLET | ORAL | Status: DC

## 2015-09-05 MED ORDER — SULFAMETHOXAZOLE-TRIMETHOPRIM 800-160 MG PO TABS: 1.0000 | ORAL_TABLET | Freq: Two times a day (BID) | ORAL | Status: DC

## 2015-09-05 MED ORDER — VENLAFAXINE HCL ER 150 MG PO CP24: 150.0000 mg | ORAL_CAPSULE | Freq: Every day | ORAL | Status: DC

## 2015-09-05 MED ORDER — METHYLPREDNISOLONE ACETATE 80 MG/ML IJ SUSP
80.0000 mg | Freq: Once | INTRAMUSCULAR | Status: AC
  Administered 2015-09-05: 80 mg via INTRAMUSCULAR

## 2015-09-05 NOTE — Assessment & Plan Note (Addendum)
Uncontrolled.Toradol and depo medrol administered IM in the office , to be followed by a short course of oral prednisone and NSAIDS. Worsening headaches in terms of frequency and severity ion past 6 months, often debilitating, re image brain and neurology referral asap

## 2015-09-05 NOTE — Patient Instructions (Addendum)
  F/u in 8 to 10 weeks, please have your Mom come with you as we duiiscussed,call if you need me sooner  Injections in office today for headache and medication sent in  Take tWO topamax tablets daily, for headache prevention  You are referred for a brain scan and to see a neurologist re uncontrolled headaches  INCREASE venlafexine to TWO daily, new script is 150 mg one daily  Septra sent for right ear infection  Thanks for choosing [facility], we consider it a privelige to serve you. Work excuse today

## 2015-09-05 NOTE — Progress Notes (Signed)
   Subjective:    Patient ID: Claire GroveSheila M [name], female    DOB: 01/06/1981, 35 y.o.   MRN: [id number]  HPI 10  Day h/o severe right ear pain rated at a 10, also right sided throbbing headache, nauseated with sudafed, felt as though she would pass out yesterday, but denies vertigo Headaches have worsened over the past year, is now ready for imaging and sppecialist attention Chill yesterday, no fever, no dental problems or sinus infection symptoms Remains depressed and increasingly withdrawn from her family, "nobody has time for me" Not suicidal or homicidal   Review of Systems See HPI Denies recent fever or chills. Denies sinus pressure, nasal congestion,or sore throat Denies chest congestion, productive cough or wheezing. Denies chest pains, palpitations and leg swelling Denies abdominal pain, nausea, vomiting,diarrhea or constipation.   Denies dysuria, frequency, hesitancy or incontinence. Denies joint pain, swelling and limitation Denies skin break down or rash.         Objective:   Physical Exam BP 118/76 mmHg  Pulse 97  Temp(Src) 98.6 F (37 C)  Resp 18  Ht 5\' 3"  (1.6 m)  Wt 309 lb (140.161 kg)  BMI 54.75 kg/m2  SpO2 98% Patient alert and oriented and in no cardiopulmonary distress.  HEENT: No facial asymmetry, EOMI,   oropharynx pink and moist.  Neck supple no JVD, no mass. Right TM erythematous,  mildly with reduced light reflex, left TM clear Chest: Clear to auscultation bilaterally.  CVS: S1, S2 no murmurs, no S3.Regular rate.  ABD: Soft non tender.   Ext: No edema  MS: Adequate ROM spine, shoulders, hips and knees.  Skin: Intact, no ulcerations or rash noted.  Psych: Good eye contact, normal affect. Memory intact not anxious or depressed appearing.  CNS: CN 2-12 intact, power,  normal throughout.no focal deficits noted.        Assessment & Plan:  New daily persistent headache (ndph) Uncontrolled.Toradol and depo medrol administered IM in the office  , to be followed by a short course of oral prednisone and NSAIDS. 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 Patient re-educated about  the importance of commitment to a  minimum of 150 minutes of exercise per week.  The importance of healthy food choices with portion control discussed. Encouraged to start a food diary, count calories and to consider  joining a support group. Sample diet sheets offered. Goals set by the patient for the next several months.   Weight /BMI 09/05/2015 03/01/2015 11/09/2014  WEIGHT 309 lb 303 lb 308 lb  HEIGHT 5\' 3"  5\' 3"  5\' 3"   BMI 54.75 kg/m2 53.69 kg/m2 54.57 kg/m2    Current exercise per week 60 mins   Allergic rhinitis Controlled, no change in medication

## 2015-09-07 NOTE — Assessment & Plan Note (Signed)
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

## 2015-09-07 NOTE — Assessment & Plan Note (Signed)
Controlled, no change in medication  

## 2015-09-07 NOTE — Assessment & Plan Note (Signed)
Deteriorated. Patient re-educated about  the importance of commitment to a  minimum of 150 minutes of exercise per week.  The importance of healthy food choices with portion control discussed. Encouraged to start a food diary, count calories and to consider  joining a support group. Sample diet sheets offered. Goals set by the patient for the next several months.   Weight /BMI 09/05/2015 03/01/2015 11/09/2014  WEIGHT 309 lb 303 lb 308 lb  HEIGHT 5\' 3"  5\' 3"  5\' 3"   BMI 54.75 kg/m2 53.69 kg/m2 54.57 kg/m2    Current exercise per week 60 mins

## 2015-09-07 NOTE — Assessment & Plan Note (Signed)
Antibiotic course prescribed 

## 2015-09-08 ENCOUNTER — Telehealth: Payer: Self-pay | Admitting: Family Medicine

## 2015-09-08 NOTE — Telephone Encounter (Signed)
Claire HatchetSheila [facility] Friday [facility] at 5:00 and she is aware

## 2015-09-08 NOTE — Telephone Encounter (Signed)
Intermittent cluster headaches with unilateral tearing of eye  At times, increased frequency and severity   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

## 2015-09-15 ENCOUNTER — Ambulatory Visit (HOSPITAL_COMMUNITY)
Admission: RE | Admit: 2015-09-15 | Discharge: 2015-09-15 | Disposition: A | Discharge status: Home / Self Care | Payer: BC Managed Care – PPO | Source: Ambulatory Visit | Attending: Family Medicine | Admitting: Family Medicine

## 2015-09-15 DIAGNOSIS — G4452 New daily persistent headache (NDPH): Secondary | ICD-10-CM

## 2015-09-19 ENCOUNTER — Ambulatory Visit (INDEPENDENT_AMBULATORY_CARE_PROVIDER_SITE_OTHER): Payer: BC Managed Care – PPO | Admitting: Neurology

## 2015-09-19 ENCOUNTER — Encounter: Payer: Self-pay | Admitting: Neurology

## 2015-09-19 VITALS — BP 121/82 | HR 90 | Ht 63.0 in | Wt 311.0 lb

## 2015-09-19 DIAGNOSIS — G43709 Chronic migraine without aura, not intractable, without status migrainosus: Secondary | ICD-10-CM | POA: Diagnosis not present

## 2015-09-19 DIAGNOSIS — IMO0002 Reserved for concepts with insufficient information to code with codable children: Secondary | ICD-10-CM | POA: Insufficient documentation

## 2015-09-19 MED ORDER — ONDANSETRON 4 MG PO TBDP: 4.0000 mg | ORAL_TABLET | Freq: Three times a day (TID) | ORAL | Status: DC | PRN

## 2015-09-19 MED ORDER — TOPIRAMATE 100 MG PO TABS: 100.0000 mg | ORAL_TABLET | Freq: Two times a day (BID) | ORAL | Status: DC

## 2015-09-19 MED ORDER — RIZATRIPTAN BENZOATE 10 MG PO TBDP: 10.0000 mg | ORAL_TABLET | ORAL | Status: DC | PRN

## 2015-09-19 NOTE — Progress Notes (Signed)
PATIENT: Claire [name] DOB: 10/28/1980  Chief Complaint  Patient presents with  . Migraine    She is here with her mother, Claire [name], to discuss an increase in the frequency of  her migraines. She would like to review her recent MRI.  She estimates getting at least one migraine weekly and sometimes two, if she is under added stress.  She has been taking Topamax 50mg , BID for the last year.  Sumatriptan works well for her pain but tends to upset her stomach.  She also uses ibuprofen at times.     HISTORICAL  Claire [name] is a 35 years old right-handed female, accompanied by her mother Claire [name], seen in refer by her primary care doctor Kerri Perches for evaluation of frequent migraine headaches  She had a history of obesity, migraine since 2014, currently work as a [employer] officer.  She used to have migraine headache about once a month, right retro-orbital area severe pounding headache with associated light noise sensitivity, movement made it worse, sleep in dark quiet room was helpful, she has been taking Imitrex 100 mg with suboptimal control, often have to take it together with 800 mg of ibuprofen.  Since March 2017, she had increased migraine headaches, about once a month,  Trigger for her migraines are stress, sleep deprivation, weather change, menstruation.  She denies visual loss, denies significant side effect with Topamax, she has been taking Topamax 50 mg twice a day since 2016, which has helped her headaches some. she reported a weight gain of 15 pounds since 2016,  REVIEW OF SYSTEMS: Full 14 system review of systems performed and notable only for As above  ALLERGIES: No Known Allergies  HOME MEDICATIONS: Current Outpatient Prescriptions  Medication Sig Dispense Refill  . azelastine (ASTELIN) 0.1 % nasal spray Place 2 sprays into both nostrils 2 (two) times daily. Use in each nostril as directed 30 mL 12  . Glucosamine HCl (GLUCOSAMINE PO) Take by mouth daily.    Marland Kitchen  ibuprofen (ADVIL,MOTRIN) 800 MG tablet One tablet twice daily, as needed for severe headache , take along with immitrex if needed Maximum 4 tablets per week 30 tablet 1  . loratadine (CLARITIN) 10 MG tablet TAKE ONE TABLET BY MOUTH ONCE DAILY 90 tablet 1  . Multiple Vitamin (MULTIVITAMIN) tablet Take 1 tablet by mouth daily.      . SUMAtriptan (IMITREX) 100 MG tablet One tablet at onset of severe headache , may repeat one time only after 2 hours if needed. Maximum of 2 tablets in 24 hours Maximum twice weekly 10 tablet 3  . topiramate (TOPAMAX) 50 MG tablet Take 1 tablet (50 mg total) by mouth 2 (two) times daily. 60 tablet 2  . TRI-SPRINTEC 0.18/0.215/0.25 MG-35 MCG tablet TAKE ONE TABLET BY MOUTH ONCE DAILY 28 tablet 4  . venlafaxine XR (EFFEXOR-XR) 150 MG 24 hr capsule Take 1 capsule (150 mg total) by mouth daily with breakfast. 30 capsule 4  . [DISCONTINUED] citalopram (CELEXA) 20 MG tablet Dose increase effective 10/22/2010, pls discontinue  10mg  dose 30 tablet 2   No current facility-administered medications for this visit.    PAST MEDICAL HISTORY: Past Medical History  Diagnosis Date  . Nicotine addiction   . Obesity   . Acne     facial  . Migraines   . Arthritis     PAST SURGICAL HISTORY: Past Surgical History  Procedure Laterality Date  . Cholecystectomy      FAMILY HISTORY: Family History  Problem Relation Age of Onset  . Hypertension Mother   . Cancer Mother 76    colon  . Hypertension Father   . Diabetes Father   . Hyperlipidemia Father   . Obesity Father   . Obesity Mother     SOCIAL HISTORY:  Social History   Social History  . Marital Status: Single    Spouse Name: N/A  . Number of Children: 0  . 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 Smoking status: Former Games developer  . Smokeless tobacco: Not on file     Comment: Quit 2008  . Alcohol Use: No  . Drug Use: No  . Sexual Activity:  Not on file   Other Topics Concern  . Not on file   Social History Narrative   Lives at home alone.   Right-handed.   Occasional use of caffeine.     PHYSICAL EXAM   Filed Vitals:   09/19/15 1524  BP: 121/82  Pulse: 90  Height:  (1.6 m)  Weight: 311 lb (141.069 kg)    Not recorded      Body mass index is 55.11 kg/(m^2).  PHYSICAL EXAMNIATION:  Gen: NAD, conversant, well nourised, obese, well groomed                     Cardiovascular: Regular rate rhythm, no peripheral edema, warm, nontender. Eyes: Conjunctivae clear without exudates or hemorrhage Neck: Supple, no carotid bruise. Pulmonary: Clear to auscultation bilaterally   NEUROLOGICAL EXAM:  MENTAL STATUS: Speech:    Speech is normal; fluent and spontaneous with normal comprehension.  Cognition:     Orientation to time, place and person     Normal recent and remote memory     Normal Attention span and concentration     Normal Language, naming, repeating,spontaneous speech     Fund of knowledge   CRANIAL NERVES: CN II: Visual fields are full to confrontation. Fundoscopic exam is normal with sharp discs and no vascular changes. Pupils are round equal and briskly reactive to light. CN III, IV, VI: extraocular movement are normal. No ptosis. CN V: Facial sensation is intact to pinprick in all 3 divisions bilaterally. Corneal responses are intact.  CN VII: Face is symmetric with normal eye closure and smile. CN VIII: Hearing is normal to rubbing fingers CN IX, X: Palate elevates symmetrically. Phonation is normal. CN XI: Head turning and shoulder shrug are intact CN XII: Tongue is midline with normal movements and no atrophy.  MOTOR: There is no pronator drift of out-stretched arms. Muscle bulk and tone are normal. Muscle strength is normal.  REFLEXES: Reflexes are 2+ and symmetric at the biceps, triceps, knees, and ankles. Plantar responses are flexor.  SENSORY: Intact to light touch, pinprick,  positional sensation and vibratory sensation are intact in fingers and toes.  COORDINATION: Rapid alternating movements and fine finger movements are intact. There is no dysmetria on finger-to-nose and heel-knee-shin.    GAIT/STANCE: Posture is normal. Gait is steady with normal steps, base, arm swing, and turning. Heel and toe walking are normal. Tandem gait is normal.  Romberg is absent.   DIAGNOSTIC DATA (LABS, IMAGING, TESTING) - I reviewed patient records, labs, notes, testing and imaging myself where available.   ASSESSMENT AND PLAN  ZAVIA PULLEN is a 35 y.o. female   Chronic migraine Obesity  Increase Topamax to 100 mg twice a day  Suboptimal response to Imitrex 100 mg tablet, will switch to Maxalt 10 mg as needed  Zofran 4 mg needed, may consider add on ibuprofen 800 mg as needed   Claire [name] Gavrielle Streck, M.D. Ph.D.  [facility] [address], [address] [place], [place] [address] Ph: [phone] Fax: [phone]  CC: Kerri PerchesMargaret E Simpson, MD

## 2015-09-21 ENCOUNTER — Encounter: Payer: Self-pay | Admitting: Neurology

## 2015-10-10 ENCOUNTER — Ambulatory Visit (INDEPENDENT_AMBULATORY_CARE_PROVIDER_SITE_OTHER): Payer: BC Managed Care – PPO | Admitting: Family Medicine

## 2015-10-10 ENCOUNTER — Encounter: Payer: Self-pay | Admitting: Family Medicine

## 2015-10-10 VITALS — BP 124/78 | HR 91 | Resp 16 | Ht 63.0 in | Wt 314.0 lb

## 2015-10-10 DIAGNOSIS — IMO0002 Reserved for concepts with insufficient information to code with codable children: Secondary | ICD-10-CM

## 2015-10-10 DIAGNOSIS — F329 Major depressive disorder, single episode, unspecified: Secondary | ICD-10-CM

## 2015-10-10 DIAGNOSIS — M79672 Pain in left foot: Secondary | ICD-10-CM | POA: Insufficient documentation

## 2015-10-10 DIAGNOSIS — F32A Depression, unspecified: Secondary | ICD-10-CM

## 2015-10-10 DIAGNOSIS — E785 Hyperlipidemia, unspecified: Secondary | ICD-10-CM

## 2015-10-10 DIAGNOSIS — M7732 Calcaneal spur, left foot: Secondary | ICD-10-CM

## 2015-10-10 DIAGNOSIS — G4452 New daily persistent headache (NDPH): Secondary | ICD-10-CM

## 2015-10-10 DIAGNOSIS — G43709 Chronic migraine without aura, not intractable, without status migrainosus: Secondary | ICD-10-CM

## 2015-10-10 MED ORDER — METHYLPREDNISOLONE ACETATE 80 MG/ML IJ SUSP
80.0000 mg | Freq: Once | INTRAMUSCULAR | Status: AC
  Administered 2015-10-10: 80 mg via INTRAMUSCULAR

## 2015-10-10 MED ORDER — IBUPROFEN 800 MG PO TABS: 800.0000 mg | ORAL_TABLET | Freq: Three times a day (TID) | ORAL | Status: DC

## 2015-10-10 MED ORDER — KETOROLAC TROMETHAMINE 60 MG/2ML IM SOLN
60.0000 mg | Freq: Once | INTRAMUSCULAR | Status: AC
  Administered 2015-10-10: 60 mg via INTRAMUSCULAR

## 2015-10-10 MED ORDER — FLUOXETINE HCL 20 MG PO TABS: 20.0000 mg | ORAL_TABLET | Freq: Every day | ORAL | Status: DC

## 2015-10-10 MED ORDER — PREDNISONE 5 MG (21) PO TBPK: 5.0000 mg | ORAL_TABLET | ORAL | Status: DC

## 2015-10-10 NOTE — Patient Instructions (Addendum)
Physical exam in 10 weeks, call if you need me beforre  Xray of foot and injections and meds today, call in 1 week if no better for referral to foot specialist  Add fluoxetine for depression, which is much improved,please follow throughwith session with therapist , P byum  Fasting labs (all) in early July    Please work on good  health habits so that your health will improve. 1. Commitment to daily physical activity for 30 to 60  minutes, if you are able to do this.  2. Commitment to wise food choices. Aim for half of your  food intake to be vegetable and fruit, one quarter starchy foods, and one quarter protein. Try to eat on a regular schedule  3 meals per day, snacking between meals should be limited to vegetables or fruits or small portions of nuts. 64 ounces of water per day is generally recommended, unless you have specific health conditions, like heart failure or kidney failure where you will need to limit fluid intake.  3. Commitment to sufficient and a  good quality of physical and mental rest daily, generally between 6 to 8 hours per day.  WITH PERSISTANCE AND PERSEVERANCE, THE IMPOSSIBLE , BECOMES THE NORM! Thank you  for choosing [facility]. We consider it a privelige to serve you.  Delivering excellent health care in a caring and  compassionate way is our goal.  Partnering with you,  so that together we can achieve this goal is our strategy.   Plantar Fasciitis Plantar fasciitis is a painful foot condition that affects the heel. It occurs when the band of tissue that connects the toes to the heel bone (plantar fascia) becomes irritated. This can happen after exercising too much or doing other repetitive activities (overuse injury). The pain from plantar fasciitis can range from mild irritation to severe pain that makes it difficult for you to walk or move. The pain is usually worse in the morning or after you have been sitting or lying down for a  while. CAUSES This condition may be caused by:  Standing for long periods of time.  Wearing shoes that do not fit.  Doing high-impact activities, including running, aerobics, and ballet.  Being overweight.  Having an abnormal way of walking (gait).  Having tight calf muscles.  Having high arches in your feet.  Starting a new athletic activity. SYMPTOMS The main symptom of this condition is heel pain. Other symptoms include:  Pain that gets worse after activity or exercise.  Pain that is worse in the morning or after resting.  Pain that goes away after you walk for a few minutes. DIAGNOSIS This condition may be diagnosed based on your signs and symptoms. Your health care provider will also do a physical exam to check for:  A tender area on the bottom of your foot.  A high arch in your foot.  Pain when you move your foot.  Difficulty moving your foot. You may also need to have imaging studies to confirm the diagnosis. These can include:  X-rays.  Ultrasound.  MRI. TREATMENT  Treatment for plantar fasciitis depends on the severity of the condition. Your treatment may include:  Rest, ice, and over-the-counter pain medicines to manage your pain.  Exercises to stretch your calves and your plantar fascia.  A splint that holds your foot in a stretched, upward position while you sleep (night splint).  Physical therapy to relieve symptoms and prevent problems in the future.  Cortisone injections to relieve  severe pain.  Extracorporeal shock wave therapy (ESWT) to stimulate damaged plantar fascia with electrical impulses. It is often used as a last resort before surgery.  Surgery, if other treatments have not worked after 12 months. HOME CARE INSTRUCTIONS  Take medicines only as directed by your health care provider.  Avoid activities that cause pain.  Roll the bottom of your foot over a bag of ice or a bottle of cold water. Do this for 20 minutes, 3-4 times a  day.  Perform simple stretches as directed by your health care provider.  Try wearing athletic shoes with air-sole or gel-sole cushions or soft shoe inserts.  Wear a night splint while sleeping, if directed by your health care provider.  Keep all follow-up appointments with your health care provider. PREVENTION   Do not perform exercises or activities that cause heel pain.  Consider finding low-impact activities if you continue to have problems.  Lose weight if you need to. The best way to prevent plantar fasciitis is to avoid the activities that aggravate your plantar fascia. SEEK MEDICAL CARE IF:  Your symptoms do not go away after treatment with home care measures.  Your pain gets worse.  Your pain affects your ability to move or do your daily activities.   This information is not intended to replace advice given to you by your health care provider. Make sure you discuss any questions you have with your health care provider.   Document Released: 01/08/2001 Document Revised: 01/04/2015 Document Reviewed: 02/23/2014 Elsevier Interactive Patient Education [employer]2016 Elsevier Inc.

## 2015-10-10 NOTE — Progress Notes (Signed)
Subjective:    Patient ID: Claire [name], female    DOB: 08/19/1980, 35 y.o.   MRN: [id number]  HPI   Claire [name]     MRN: [id number]      DOB: 04/30/1980   HPI Claire [name] is here with 1 week h/o left heel pain, had been very involved in sporting activities on the job prior to onsetfor follow up and re-evaluation of chronic medical conditions, medication management and review of any available recent lab and radiology data.  Preventive health is updated, specifically  Cancer screening and Immunization.   Questions or concerns regarding consultations or procedures which the PT has had in the interim are  Addressed.Very pleased with neuro eval and headaches ARE IMPROVED The PT denies any adverse reactions to current medications since the last visit.     ROS Denies recent fever or chills. Denies sinus pressure, nasal congestion, ear pain or sore throat. Denies chest congestion, productive cough or wheezing. Denies chest pains, palpitations and leg swelling Denies abdominal pain, nausea, vomiting,diarrhea or constipation.   Denies dysuria, frequency, hesitancy or incontinence.  Denies headaches, seizures, numbness, or tingling. Denies skin break down or rash.   PE  BP 124/78 mmHg  Pulse 91  Resp 16  Ht 5\' 3"  (1.6 m)  Wt 314 lb (142.429 kg)  BMI 55.64 kg/m2  SpO2 96%  Patient alert and oriented and in no cardiopulmonary distress.Pt in pain  HEENT: No facial asymmetry, EOMI,   oropharynx pink and moist.  Neck supple no JVD, no mass.  Chest: Clear to auscultation bilaterally.  CVS: S1, S2 no murmurs, no S3.Regular rate.  ABD: Soft non tender.   Ext: No edema  MS: Adequate ROM spine, shoulders, hips and knees.Tender over right ankle on palpation, no erythema , warmth or skin breakdown, no tenderness over achilles tendon, minimal tenderness over instep, full ROM both ankles Skin: Intact, no ulcerations or rash noted.  Psych: Good eye contact, normal affect. Memory intact  not anxious or depressed appearing.  CNS: CN 2-12 intact, power,  normal throughout.no focal deficits noted.   Assessment & Plan   Foot pain, left Acute onset history and exam suggest spurs.toradol and depo medrol in office Short anti inflammatory course and topical ice twice daily, also stretching exercises to treat potential fascitis. Call back for podiatry eval in 1 week if needed Weight loss to   Be addressed  New daily persistent headache (ndph) improved with increased dose of topamax, has had 2 headaches since last visit, good response to new triptan F/u with neurology  Planned and encouraged  Depression Marked improvement, will still benefit from therapy and agrees to be treated , will refer Additional dose adjustment made also Not suicidal or homicidal  Morbid obesity Deteriorated. Patient re-educated about  the importance of commitment to a  minimum of 150 minutes of exercise per week.  The importance of healthy food choices with portion control discussed. Encouraged to start a food diary, count calories and to consider  joining a support group. Sample diet sheets offered. Goals set by the patient for the next several months.   Weight /BMI 10/10/2015 09/19/2015 09/05/2015  WEIGHT 314 lb 311 lb 309 lb  HEIGHT 5\' 3"  5\' 3"  5\' 3"   BMI 55.64 kg/m2 55.11 kg/m2 54.75 kg/m2    Current exercise per week 60 minutes.   Chronic migraine Improved with change in management following recent neurology eval       Review of Systems  Objective:   Physical Exam        Assessment & Plan:

## 2015-10-11 ENCOUNTER — Ambulatory Visit (HOSPITAL_COMMUNITY)
Admission: RE | Admit: 2015-10-11 | Discharge: 2015-10-11 | Disposition: A | Discharge status: Home / Self Care | Payer: BC Managed Care – PPO | Source: Ambulatory Visit | Attending: Family Medicine | Admitting: Family Medicine

## 2015-10-11 DIAGNOSIS — M79672 Pain in left foot: Secondary | ICD-10-CM | POA: Diagnosis present

## 2015-10-12 ENCOUNTER — Encounter: Payer: Self-pay | Admitting: Family Medicine

## 2015-10-15 ENCOUNTER — Other Ambulatory Visit: Payer: Self-pay | Admitting: Family Medicine

## 2015-10-16 ENCOUNTER — Other Ambulatory Visit: Payer: Self-pay | Admitting: Family Medicine

## 2015-10-16 ENCOUNTER — Encounter: Payer: Self-pay | Admitting: Family Medicine

## 2015-10-16 DIAGNOSIS — F32A Depression, unspecified: Secondary | ICD-10-CM

## 2015-10-16 DIAGNOSIS — F329 Major depressive disorder, single episode, unspecified: Secondary | ICD-10-CM

## 2015-10-16 NOTE — Assessment & Plan Note (Signed)
Marked improvement, will still benefit from therapy and agrees to be treated , will refer Additional dose adjustment made also Not suicidal or homicidal

## 2015-10-16 NOTE — Assessment & Plan Note (Signed)
Improved with change in management following recent neurology eval

## 2015-10-16 NOTE — Assessment & Plan Note (Signed)
Deteriorated. Patient re-educated about  the importance of commitment to a  minimum of 150 minutes of exercise per week.  The importance of healthy food choices with portion control discussed. Encouraged to start a food diary, count calories and to consider  joining a support group. Sample diet sheets offered. Goals set by the patient for the next several months.   Weight /BMI 10/10/2015 09/19/2015 09/05/2015  WEIGHT 314 lb 311 lb 309 lb  HEIGHT 5\' 3"  5\' 3"  5\' 3"   BMI 55.64 kg/m2 55.11 kg/m2 54.75 kg/m2    Current exercise per week 60 minutes.

## 2015-10-16 NOTE — Assessment & Plan Note (Addendum)
Acute onset history and exam suggest spurs.toradol and depo medrol in office Short anti inflammatory course and topical ice twice daily, also stretching exercises to treat potential fascitis. Call back for podiatry eval in 1 week if needed Weight loss to   Be addressed

## 2015-10-16 NOTE — Assessment & Plan Note (Signed)
improved with increased dose of topamax, has had 2 headaches since last visit, good response to new triptan F/u with neurology  Planned and encouraged

## 2015-10-19 ENCOUNTER — Encounter: Payer: Self-pay | Admitting: Family Medicine

## 2015-10-19 DIAGNOSIS — F32A Depression, unspecified: Secondary | ICD-10-CM

## 2015-10-19 DIAGNOSIS — F329 Major depressive disorder, single episode, unspecified: Secondary | ICD-10-CM

## 2015-10-21 ENCOUNTER — Other Ambulatory Visit: Payer: Self-pay | Admitting: Family Medicine

## 2015-10-21 ENCOUNTER — Telehealth: Payer: Self-pay | Admitting: Family Medicine

## 2015-10-21 NOTE — Telephone Encounter (Signed)
I spoke directly with pt today re her antidepressant medication. She never filled and has not started the fluoxetine due to concerns regarding potential serotonin syndrome from drug interaction between fluoxetine and effexor At last visit she was improved on higher dose of effexor. Dure to her concerns , I advised her not to fill it, I have removed it from her medication list , and she is in agreement to having psychiatry manage her depression Of note , she is neither suicidal nor homicidal

## 2015-11-06 LAB — CBC
HEMATOCRIT: 38.9 % (ref 35.0–45.0)
HEMOGLOBIN: 12.9 g/dL (ref 11.7–15.5)
MCH: 29.2 pg (ref 27.0–33.0)
MCHC: 33.2 g/dL (ref 32.0–36.0)
MCV: 88 fL (ref 80.0–100.0)
MPV: 10.1 fL (ref 7.5–12.5)
Platelets: 302 10*3/uL (ref 140–400)
RBC: 4.42 MIL/uL (ref 3.80–5.10)
RDW: 14.2 % (ref 11.0–15.0)
WBC: 6 10*3/uL (ref 3.8–10.8)

## 2015-11-06 LAB — HEMOGLOBIN A1C
Hgb A1c MFr Bld: 5.4 % (ref <5.7)
Mean Plasma Glucose: 108 mg/dL

## 2015-11-07 ENCOUNTER — Encounter: Payer: Self-pay | Admitting: Family Medicine

## 2015-11-07 ENCOUNTER — Ambulatory Visit: Payer: BC Managed Care – PPO | Admitting: Family Medicine

## 2015-11-07 LAB — LIPID PANEL
CHOL/HDL RATIO: 3.6 ratio (ref <=5.0)
Cholesterol: 211 mg/dL — ABNORMAL HIGH (ref 125–200)
HDL: 58 mg/dL (ref >=46)
LDL CALC: 121 mg/dL (ref <130)
Triglycerides: 158 mg/dL — ABNORMAL HIGH (ref <150)
VLDL: 32 mg/dL — AB (ref <30)

## 2015-11-07 LAB — TSH: TSH: 2.27 m[IU]/L

## 2015-11-14 ENCOUNTER — Ambulatory Visit (INDEPENDENT_AMBULATORY_CARE_PROVIDER_SITE_OTHER): Payer: BC Managed Care – PPO | Admitting: Psychiatry

## 2015-11-14 ENCOUNTER — Encounter (HOSPITAL_COMMUNITY): Payer: Self-pay | Admitting: Psychiatry

## 2015-11-14 DIAGNOSIS — F329 Major depressive disorder, single episode, unspecified: Secondary | ICD-10-CM

## 2015-11-14 DIAGNOSIS — F32A Depression, unspecified: Secondary | ICD-10-CM

## 2015-11-14 NOTE — Patient Instructions (Signed)
Discussed orally 

## 2015-11-14 NOTE — Progress Notes (Signed)
Comprehensive Clinical Assessment (CCA) Note  11/14/2015 Claire [name] [id number]  Visit Diagnosis:      ICD-9-CM ICD-10-CM   1. Depressive disorder 311 F32.9       CCA Part One  Part One has been completed on paper by the patient.  (See scanned document in Chart Review)  CCA Part Two A  Intake/Chief Complaint:  CCA Intake With Chief Complaint CCA Part Two Date: 11/14/15 CCA Part Two Time: 1024 Chief Complaint/Presenting Problem: I noticed I started sleeping more about 2 years ago and attributed it to diabetes, But over time, Dr. Lodema Hong noticed I had more symptoms related to depression. She put me on effexor about 6 months ago. I felt a little bit better but still didn't feel like myself, The dosage was doubled and I was more perky and had more energy but  i still wasn't my usual self. Medicine again was increased and this helped and I felt like myself. Recentlly, I began experiencing low energy and poor motivation again., I have stress at work tryng to make certain I do everything that needs to be done. 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 My brother and sister both are married and have children. They reside in [place] and my parents are always over there and plan to move to [facility]. I  am single  and feel lilke the outcast of the family. Mother isn't understanding of my depression.  Patients Currently Reported Symptoms/Problems: low energy, poor motivation, feel alone, loss of interest, excessive sleeping, loss of libido, depressed mood Type of Services Patient Feels Are Needed: Individual therapy Initial Clinical Notes/Concerns: Patient presents with symptoms of depression that began about a year ago. She began taking antidepressants which helped initially and had to be increased. She recently began to experience low energy and poor motivation again and has been referred for therapy and medication management by PCP Dr.  Syliva Overman. 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 Patient reports feeling lilke an outcast in her family as her parents are moving to move to [place] where patient's brother and sister reside.    Mental Health Symptoms Depression:  Depression: Change in energy/activity, Sleep (too much or little), Fatigue  Mania:  Mania: N/A  Anxiety:   Anxiety: N/A  Psychosis:  Psychosis: N/A  Trauma:  Trauma: N/A  Obsessions:  Obsessions: N/A  Compulsions:  Compulsions: N/A  Inattention:  Inattention: N/A  Hyperactivity/Impulsivity:  Hyperactivity/Impulsivity: N/A  Oppositional/Defiant Behaviors:  Oppositional/Defiant Behaviors: N/A  Borderline Personality:  Emotional Irregularity: N/A  Other Mood/Personality Symptoms:     Mental Status Exam Appearance and self-care  Stature:  Stature: Average  Weight:  Weight: Obese  Clothing:  Clothing: Casual  Grooming:  Grooming: Normal  Cosmetic use:  Cosmetic Use: None  Posture/gait:  Posture/Gait: Normal  Motor activity:  Motor Activity: Not Remarkable  Sensorium  Attention:  Attention: Normal  Concentration:  Concentration: Normal  Orientation:  Orientation: Object, Person, Place, Situation, Time  Recall/memory:  Recall/Memory: Normal  Affect and Mood  Affect:  Affect: Depressed  Mood:  Mood: Depressed  Relating  Eye contact:  Eye Contact: Normal  Facial expression:  Facial Expression: Responsive  Attitude toward examiner:  Attitude Toward Examiner: Cooperative  Thought and Language  Speech flow: Speech  Flow: Normal  Thought content:  Thought Content: Appropriate to mood and circumstances  Preoccupation:  Preoccupations: Ruminations  Hallucinations:  Hallucinations: Other (Comment) (None)  Organization:    Company secretaryxecutive Functions  Fund of Knowledge:  Fund of Knowledge: Average   Intelligence:  Intelligence: Average  Abstraction:  Abstraction: Normal  Judgement:  Judgement: Normal  Reality Testing:  Reality Testing: Realistic  Insight:  Insight: Good  Decision Making:  Decision Making: Normal  Social Functioning  Social Maturity:  Social Maturity: Isolates  Social Judgement:  Social Judgement: Normal  Stress  Stressors:  Stressors: Work, Transitions, Family conflict  Coping Ability:  Coping Ability: Horticulturist, commercialxhausted  Skill Deficits:    Supports:     Family and Psychosocial History: Family history Marital status: Single Are you sexually active?: Yes What is your sexual orientation?: Heterosexual Has your sexual activity been affected by drugs, alcohol, medication, or emotional stress?: emotiional stress Does patient have children?: No  Childhood History:  Childhood History By whom was/is the patient raised?: Both parents Description of patient's relationship with caregiver when they were a child: patient reports a pretty good relationship with father. She reports a pretty good relationship with mother.  Patient's description of current relationship with people who raised him/her: positive relatiionship with father, strained relationship with mother due to mother not understanding depression per patient's report How were you disciplined when you got in trouble as a child/adolescent?: spankings, groundings,  Does patient have siblings?: Yes Number of Siblings: 4 (Patient has two full siblings and two half siblings) Description of patient's current relationship with siblings: Patient reports easy going relationship with full brother, guarded relationship with full sister because she shares information with her husband whom patient does not care for per her report, no contact with half brother, distant relatiionship with her half sister.  Did patient suffer any verbal/emotional/physical/sexual abuse as a child?: No Did patient suffer from severe childhood neglect?:  No Has patient ever been sexually abused/assaulted/raped as an adolescent or adult?: No Was the patient ever a victim of a crime or a disaster?: No Witnessed domestic violence?: No Has patient been effected by domestic violence as an adult?: No  CCA Part Two B  Employment/Work Situation: Employment / Work Psychologist, occupationalituation Employment situation: Employed Where is patient currently employed?: [employer] How long has patient been employed?: 3 years Patient's job has been impacted by current illness: Yes Describe how patient's job has been impacted: lack of motivation to try different things in classroom What is the longest time patient has a held a job?: 3 years Where was the patient employed at that time?: Current employer Has patient ever been in the [employer] and Companymilitary?: No Has patient ever served in combat?: No Did You Receive Any Psychiatric Treatment/Services While in Equities traderthe Military?: No Are There Guns or Other Weapons in Your Home?: No  Education: Education Did Garment/textile technologistYou Graduate From [employer]?: Yes Did Theme park managerYou Attend College?: Yes 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 - [employer] Did You Have Any Scientist, research (life sciences)pecial Interests In School?: No Did You Have An Individualized Education Program (IIEP): No Did You Have Any Difficulty At [employer] EnergySchool?: No  Religion: Religion/Spirituality Are You A Religious Person?: Yes What is Your Religious Affiliation?: Baptist How Might This Affect Treatment?: No effect  Leisure/Recreation: Leisure / Recreation Leisure and Hobbies: normally likes to read and spend time with family  Exercise/Diet: Exercise/Diet Do You Exercise?: No Have You Gained or Lost A Significant Amount of Weight in the Past Six Months?: Yes-Gained Number of Pounds  Gained: 15 Do You Follow a Special Diet?: No Do You Have Any Trouble Sleeping?: Yes Explanation of Sleeping Difficulties: excessive sleeping - 10-12 hours  CCA Part Two C  Alcohol/Drug Use: Alcohol / Drug  Use History of alcohol / drug use?: No history of alcohol / drug abuse  CCA Part Three  ASAM's:  Six Dimensions of Multidimensional Assessment N/A  Substance use Disorder (SUD) N/A  Social Function:  Social Functioning Social Maturity: Isolates Social Judgement: Normal  Stress:  Stress Stressors: Work, Transitions, Family conflict Coping Ability: Exhausted Patient Takes Medications The Way The Doctor Instructed?: Yes Priority Risk: Moderate Risk  Risk Assessment- Self-Harm Potential: Risk Assessment For Self-Harm Potential Thoughts of Self-Harm: No current thoughts  Risk Assessment -Dangerous to Others Potential: Risk Assessment For Dangerous to Others Potential Method: No Plan Notification Required: No need or identified person  DSM5 Diagnoses: Patient Active Problem List   Diagnosis Date Noted  . Foot pain, left 10/10/2015  . Chronic migraine 09/19/2015  . New daily persistent headache (ndph) 09/05/2015  . Allergic rhinitis 03/10/2015  . Backache 03/10/2015  . Heel spur 03/10/2015  . Migraine-cluster headache syndrome 09/11/2014  . Depression 07/28/2014  . Dyslipidemia, goal LDL below 100 07/11/2013  . SLEEP APNEA 07/05/2010  . INTERNAL HEMORRHOIDS WITH OTHER COMPLICATION 10/04/2009  . Morbid obesity (HCC) 09/23/2007    Patient Centered Plan: Patient is on the following Treatment Plan(s):  Depressive Disorder  Recommendations for Services/Supports/Treatments: Recommendations for Services/Supports/Treatments Recommendations For Services/Supports/Treatments: Individual Therapy  Treatment Plan Summary: Patient attends the assessment appointment today. Confidentiality and limits are discussed. The patient agrees return for an appointment in 2 weeks for continuing assessment and treatment planning. Patient is scheduled to see psychiatrist Dr. Tenny Craw for a medication evaluation. Patient agrees to call this practice, call 911, or have someone take her to the emergency  room should symptoms worsen. Individual therapy is recommended 1 time every 1-2 weeks to learn and implement strategies to overcome depression.  Referrals to Alternative Service(s): Referred to Alternative Service(s):   Place:   Date:   Time:    Referred to Alternative Service(s):   Place:   Date:   Time:    Referred to Alternative Service(s):   Place:   Date:   Time:    Referred to Alternative Service(s):   Place:   Date:   Time:     Renald Haithcock

## 2015-11-28 ENCOUNTER — Encounter (HOSPITAL_COMMUNITY): Payer: Self-pay | Admitting: Psychiatry

## 2015-11-28 ENCOUNTER — Ambulatory Visit (INDEPENDENT_AMBULATORY_CARE_PROVIDER_SITE_OTHER): Payer: BC Managed Care – PPO | Admitting: Psychiatry

## 2015-11-28 DIAGNOSIS — F329 Major depressive disorder, single episode, unspecified: Secondary | ICD-10-CM | POA: Diagnosis not present

## 2015-11-28 DIAGNOSIS — F32A Depression, unspecified: Secondary | ICD-10-CM

## 2015-11-28 NOTE — Progress Notes (Signed)
   THERAPIST PROGRESS NOTE  Session Time: Tuesday 11/28/2015 10 :07 AM - 11:05 AM  Participation Level: Active  Behavioral Response: CasualAlertAnxious and Depressed  Type of Therapy: Individual Therapy  Treatment Goals addressed: Establish rapport, learn and implement behavioral strategies to overcome depression  Interventions: CBT and Supportive  Summary: Claire [name] is a 35 y.o. female who presents with symptoms of depression that began about a year ago. She began taking antidepressants which helped initially and had to be increased. She recently began to experience low energy and poor motivation again and has been referred for therapy and medication management by PCP Dr. Syliva Overman. 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 Patient reports feeling lilke an outcast in her family as her parents are moving to [place] where patient's brother and sister reside. Patient's current symptoms include excessive sleeping, low energy, poor motivation, feel alone, loss of interest, loss of libido, and depressed mood.  Patient reports little to no change in symptoms since the assessment session. She continues to experience excessive sleeping, low energy, poor motivation, and depressed mood. 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 She fears possible changes including her team composition, class size, and her class assignment. She is pleased her sister has initiated more frequent contact with her and reports enjoying a recent lunch with her sister. She continues to express sadness and frustration regarding the relationship with her mother and shares more information today about their relationship prior to her mother having grandchildren. Patient admits missing the way  things were prior to that time. Patient reports that she has not talked with her mother about this and admits tendency to internalize her feelings.  Suicidal/Homicidal: No  Therapist Response: Establish rapport, reviewed symptoms, facilitated expression of feelings, provide psychoeducation regarding depression, assisted patient identify ways to improve self-care with use of daily planning  Plan: Return again in 2 weeks. Patient agrees to implement steps discussed in session to improve self-care/increase involvement in activity with use of daily planning and bring completed forms to next session.  Diagnosis: Axis I: Depressive Disorder NOS    Axis II: Deferred    Antoinette Haskett, LCSW 11/28/2015

## 2015-12-05 ENCOUNTER — Other Ambulatory Visit (HOSPITAL_COMMUNITY)
Admission: RE | Admit: 2015-12-05 | Discharge: 2015-12-05 | Disposition: A | Discharge status: Home / Self Care | Payer: BC Managed Care – PPO | Source: Ambulatory Visit | Attending: Family Medicine | Admitting: Family Medicine

## 2015-12-05 ENCOUNTER — Encounter: Payer: Self-pay | Admitting: Family Medicine

## 2015-12-05 ENCOUNTER — Ambulatory Visit (INDEPENDENT_AMBULATORY_CARE_PROVIDER_SITE_OTHER): Payer: BC Managed Care – PPO | Admitting: Family Medicine

## 2015-12-05 VITALS — BP 114/80 | HR 75 | Resp 16 | Ht 63.0 in | Wt 318.0 lb

## 2015-12-05 DIAGNOSIS — Z0000 Encounter for general adult medical examination without abnormal findings: Secondary | ICD-10-CM | POA: Diagnosis not present

## 2015-12-05 DIAGNOSIS — Z01419 Encounter for gynecological examination (general) (routine) without abnormal findings: Secondary | ICD-10-CM | POA: Diagnosis not present

## 2015-12-05 DIAGNOSIS — Z23 Encounter for immunization: Secondary | ICD-10-CM | POA: Diagnosis not present

## 2015-12-05 DIAGNOSIS — Z124 Encounter for screening for malignant neoplasm of cervix: Secondary | ICD-10-CM

## 2015-12-05 NOTE — Patient Instructions (Signed)
F/[place] January, return in September for flu vaccine, call if you need me sooner  TdAP today  Please work on good  health habits so that your health will improve. 1. Commitment to daily physical activity for 30 to 60  minutes, if you are able to do this.  2. Commitment to wise food choices. Aim for half of your  food intake to be vegetable and fruit, one quarter starchy foods, and one quarter protein. Try to eat on a regular schedule  3 meals per day, snacking between meals should be limited to vegetables or fruits or small portions of nuts. 64 ounces of water per day is generally recommended, unless you have specific health conditions, like heart failure or kidney failure where you will need to limit fluid intake.  3. Commitment to sufficient and a  good quality of physical and mental rest daily, generally between 6 to 8 hours per day.  WITH PERSISTANCE AND PERSEVERANCE, THE IMPOSSIBLE , BECOMES THE NORM! Thank you  for choosing [facility]. We consider it a privelige to serve you.  Delivering excellent health care in a caring and  compassionate way is our goal.  Partnering with you,  so that together we can achieve this goal is our strategy.

## 2015-12-05 NOTE — Assessment & Plan Note (Signed)

## 2015-12-06 MED ORDER — IBUPROFEN 800 MG PO TABS: ORAL_TABLET | ORAL | 2 refills | Status: DC

## 2015-12-06 MED ORDER — NORGESTIM-ETH ESTRAD TRIPHASIC 0.18/0.215/0.25 MG-35 MCG PO TABS: 1.0000 | ORAL_TABLET | Freq: Every day | ORAL | 11 refills | Status: DC

## 2015-12-07 LAB — CYTOLOGY - PAP

## 2015-12-08 DIAGNOSIS — Z23 Encounter for immunization: Secondary | ICD-10-CM | POA: Insufficient documentation

## 2015-12-08 NOTE — Assessment & Plan Note (Signed)
After obtaining informed consent, the vaccine is  administered by LPN.  

## 2015-12-08 NOTE — Progress Notes (Signed)
    Claire [name]     MRN: [id number]      DOB: 02/28/1981  HPI: Patient is in for annual physical exam. No other health concerns are expressed or addressed at the visit. Recent labs, if available are reviewed. Immunization is reviewed , and  updated if needed.   PE: Pleasant  female, alert and oriented x 3, in no cardio-pulmonary distress. Afebrile. HEENT No facial trauma or asymetry. Sinuses non tender.  Extra occullar muscles intact, pupils equally reactive to light. External ears normal, tympanic membranes clear. Oropharynx moist, no exudate. Neck: supple, no adenopathy,JVD or thyromegaly.No bruits.  Chest: Clear to ascultation bilaterally.No crackles or wheezes. Non tender to palpation  Breast: No asymetry,no masses or lumps. No tenderness. No nipple discharge or inversion. No axillary or supraclavicular adenopathy  Cardiovascular system; Heart sounds normal,  S1 and  S2 ,no S3.  No murmur, or thrill. Apical beat not displaced Peripheral pulses normal.  Abdomen: Soft, non tender, no organomegaly or masses. No bruits. Bowel sounds normal. No guarding, tenderness or rebound.  GU: External genitalia normal female genitalia , normal female distribution of hair. No lesions. Urethral meatus normal in size, no  Prolapse, no lesions visibly  Present. Bladder non tender. Vagina pink and moist , with no visible lesions , discharge present . Adequate pelvic support no  cystocele or rectocele noted Cervix pink and appears healthy, no lesions or ulcerations noted, no discharge noted from os Uterus normal size, no adnexal masses, no cervical motion or adnexal tenderness.   Musculoskeletal exam: Full ROM of spine, hips , shoulders and reduced in  knees. swelling and  crepitus noted In knees No muscle wasting or atrophy.   Neurologic: Cranial nerves 2 to 12 intact. Power, tone ,sensation and reflexes normal throughout. No disturbance in gait. No tremor.  Skin: Intact,  no ulceration, erythema , scaling or rash noted. Pigmentation normal throughout  Psych; Normal mood and affect. Judgement and concentration normal   Assessment & Plan:  Annual physical exam Annual exam as documented. Counseling done  re healthy lifestyle involving commitment to 150 minutes exercise per week, heart healthy diet, and attaining healthy weight.The importance of adequate sleep also discussed. Regular seat belt use and home safety, is also discussed. Changes in health habits are decided on by the patient with goals and time frames  set for achieving them. Immunization and cancer screening needs are specifically addressed at this visit.   Need for Tdap vaccination After obtaining informed consent, the vaccine is  administered by LPN.

## 2015-12-12 ENCOUNTER — Ambulatory Visit (INDEPENDENT_AMBULATORY_CARE_PROVIDER_SITE_OTHER): Payer: BC Managed Care – PPO | Admitting: Psychiatry

## 2015-12-12 ENCOUNTER — Encounter (HOSPITAL_COMMUNITY): Payer: Self-pay | Admitting: Psychiatry

## 2015-12-12 DIAGNOSIS — F329 Major depressive disorder, single episode, unspecified: Secondary | ICD-10-CM | POA: Diagnosis not present

## 2015-12-12 DIAGNOSIS — F32A Depression, unspecified: Secondary | ICD-10-CM

## 2015-12-12 NOTE — Progress Notes (Signed)
   THERAPIST PROGRESS NOTE  Session Time: Tuesday 12/12/2015 10:08 AM - 11:01 AM  Participation Level: Active  Behavioral Response: CasualAlert/less depressed/anxious  Type of Therapy: Individual Therapy  Treatment Goals addressed: Establish rapport, learn and implement behavioral strategies to overcome depression and calming skills to manage anxiety.  Interventions: CBT and Supportive  Summary: Alvia GroveSheila M Haughton is a 35 y.o. female who presents with symptoms of depression that began about a year ago. She began taking antidepressants which helped initially and had to be increased. She recently began to experience low energy and poor motivation again and has been referred for therapy and medication management by PCP Dr. Syliva OvermanMargaret Simpson. 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 Patient reports feeling lilke an outcast in her family as her [place] patient's brother and sister reside. Patient's current symptoms include excessive sleeping, low energy, poor motivation, feel alone, loss of interest, loss of libido, and depressed mood.  Patient reports experiencing some days she wasn't depressed since last session. She has increased her social involvement including going out to breakfast with a friend, contacting another friend via text, and helping her mother babysit the grandchildren. Patient also has tried to improve self-care efforts especially regarding nutrition as she reports a more healthy eating pattern and says she is drinking more water. She continues to express sadness and frustration regarding her mother who still is not understanding of patient's condition per patient's report. 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 She reports anticipatory worry about  possible changes with the new administration. Patient also reports a tendency to place a lot of pressure on self about her performance.  Suicidal/Homicidal: No  Therapist Response:  reviewed symptoms, facilitated expression of feelings, praise and reinforced patient's increased social involvement and increased efforts regarding self-care, assisted patient identify ways to improve self-care regarding exercise, discussed rationale for and assisted patient practice controlled breathing to manage stress and anxiety   Plan: Return again in 2 weeks. Patient agrees to implement steps discussed in session to improve self-care, practice relaxation breathing 5-10 minutes 2 times per day. Patient is scheduled to see psychiatrist Dr. Tenny Crawoss for medication evaluation.  Diagnosis: Axis I: Depressive Disorder NOS    Axis II: Deferred    Loron Weimer, LCSW 12/12/2015

## 2015-12-18 ENCOUNTER — Ambulatory Visit: Payer: BC Managed Care – PPO | Admitting: Neurology

## 2015-12-26 ENCOUNTER — Encounter (HOSPITAL_COMMUNITY): Payer: Self-pay | Admitting: Psychiatry

## 2015-12-26 ENCOUNTER — Ambulatory Visit (INDEPENDENT_AMBULATORY_CARE_PROVIDER_SITE_OTHER): Payer: BC Managed Care – PPO | Admitting: Psychiatry

## 2015-12-26 DIAGNOSIS — F329 Major depressive disorder, single episode, unspecified: Secondary | ICD-10-CM | POA: Diagnosis not present

## 2015-12-26 DIAGNOSIS — F32A Depression, unspecified: Secondary | ICD-10-CM

## 2015-12-26 NOTE — Progress Notes (Signed)
   THERAPIST PROGRESS NOTE  Session Time: Tuesday 12/26/2015 4:10 PM - 5:00 PM  Participation Level: Active  Behavioral Response: CasualAlert/less depressed/anxious  Type of Therapy: Individual Therapy  Treatment Goals addressed:  learn and implement behavioral strategies to overcome depression and calming skills to manage anxiety.  Interventions: CBT and Supportive  Summary: Claire [name] is a 35 y.o. female who presents with symptoms of depression that began about a year ago. She began taking antidepressants which helped initially and had to be increased. She recently began to experience low energy and poor motivation again and has been referred for therapy and medication management by PCP Dr. Tula Nakayama. 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 Patient reports feeling lilke an outcast in her family as her parents are moving to [place] where patient's brother and sister reside. Patient's current symptoms include excessive sleeping, low energy, poor motivation, feel alone, loss of interest, loss of libido, and depressed mood.  Patient reports increased involvement in activity since last session. 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 She reports feeling better since she has met with the new administration and clear expectations have been set. 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 Patient hopes to improve her self-care efforts now that she has returned to work and has a schedule. She reports she has been practicing relaxation breathing when she remembers and says it has been helpful.  Suicidal/Homicidal: No  Therapist Response:   reviewed symptoms, facilitated expression of feelings, praise and reinforced patient's efforts to avoid perfectionistic tendencies, assisted patient identify ways to develop a regular routine to improve self-care, began to develop treatment plan   Plan: Return again in 2 weeks. Patient agrees to implement steps discussed in session. Diagnosis: Axis I: Depressive Disorder NOS    Axis II: Deferred    Claire Fogleman, LCSW 12/26/2015

## 2016-01-04 ENCOUNTER — Telehealth (HOSPITAL_COMMUNITY): Payer: Self-pay | Admitting: *Deleted

## 2016-01-04 NOTE — Telephone Encounter (Signed)
Called pt to resch appt due to provider being out on 01-08-16. lmtcb and office number was provided.  

## 2016-01-08 ENCOUNTER — Ambulatory Visit (HOSPITAL_COMMUNITY): Payer: Self-pay | Admitting: Psychiatry

## 2016-01-08 ENCOUNTER — Telehealth (HOSPITAL_COMMUNITY): Payer: Self-pay | Admitting: *Deleted

## 2016-01-08 NOTE — Telephone Encounter (Signed)
Called pt due to previous phone call. Called pt to resch appt and appt has been rescheduled already. Office number provided on pt voicemail if she still need assistance.

## 2016-01-08 NOTE — Telephone Encounter (Signed)
patient returned Octavia's call. 

## 2016-01-09 ENCOUNTER — Encounter (HOSPITAL_COMMUNITY): Payer: Self-pay | Admitting: Psychiatry

## 2016-01-09 ENCOUNTER — Ambulatory Visit (INDEPENDENT_AMBULATORY_CARE_PROVIDER_SITE_OTHER): Payer: BC Managed Care – PPO | Admitting: Psychiatry

## 2016-01-09 DIAGNOSIS — F329 Major depressive disorder, single episode, unspecified: Secondary | ICD-10-CM

## 2016-01-09 DIAGNOSIS — F32A Depression, unspecified: Secondary | ICD-10-CM

## 2016-01-09 NOTE — Progress Notes (Signed)
   THERAPIST PROGRESS NOTE  Session Time: Tuesday 01/09/2016 4:07 - PM 4:49 PM  Participation Level: Active  Behavioral Response: CasualAlert/less depressed/anxious  Type of Therapy: Individual Therapy  Treatment Goals addressed:  learn and implement behavioral strategies to overcome depression and calming skills to manage anxiety.  Interventions: CBT and Supportive  Summary: Claire [name] is a 35 y.o. female who presents with symptoms of depression that began about a year ago. She began taking antidepressants which helped initially and had to be increased. She recently began to experience low energy and poor motivation again and has been referred for therapy and medication management by PCP Dr. Syliva OvermanMargaret Simpson. 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 Patient reports feeling lilke an outcast in her family as [place]'s [place] and sister reside. Patient's current symptoms include excessive sleeping, low energy, poor motivation, feel alone, loss of interest, loss of libido, and depressed mood.  Patient reports increased stress since last session. 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 She reports poor self-care regarding eating and exercise but has been trying to improve sleep schedule. However, she has difficulty falling asleep due to racing thoughts. She reports running out of her Effexor on 01/04/2016 but resumed taking it today. She still has concerns about Effexor possibly not being strong enough. Her appointment with psychiatrist Dr. Tenny Crawoss has been rescheduled for October 2017. Patient agrees to request name be placed on waiting list for an earlier appointment. She reports being more emotional the past  few days. She admits she has not been practicing relaxation breathing.   Suicidal/Homicidal: No  Therapist Response:  reviewed symptoms, facilitated expression of feelings, assisted patient identify strengths and supports, developed treatment plan, discussed the importance of medication compliance, reviewed rationale for practicing controlled breathing consistently, assisted patient identify ways to improve self-care   Plan: Return again in 2 weeks. Patient agrees to implement steps discussed in session.  Diagnosis: Axis I: Depressive Disorder NOS    Axis II: Deferred    Claire Morrish, LCSW 01/09/2016

## 2016-02-01 ENCOUNTER — Telehealth: Payer: Self-pay | Admitting: *Deleted

## 2016-02-01 ENCOUNTER — Ambulatory Visit: Payer: Self-pay | Admitting: Neurology

## 2016-02-01 NOTE — Telephone Encounter (Signed)
Patient called the answering service at 6:16am to cancel her 8:30am appointment.

## 2016-02-03 ENCOUNTER — Other Ambulatory Visit: Payer: Self-pay | Admitting: Family Medicine

## 2016-02-03 DIAGNOSIS — F329 Major depressive disorder, single episode, unspecified: Secondary | ICD-10-CM

## 2016-02-03 DIAGNOSIS — F32A Depression, unspecified: Secondary | ICD-10-CM

## 2016-02-05 ENCOUNTER — Encounter: Payer: Self-pay | Admitting: Neurology

## 2016-02-19 ENCOUNTER — Ambulatory Visit (INDEPENDENT_AMBULATORY_CARE_PROVIDER_SITE_OTHER): Payer: BC Managed Care – PPO | Admitting: Psychiatry

## 2016-02-19 ENCOUNTER — Encounter (HOSPITAL_COMMUNITY): Payer: Self-pay | Admitting: Psychiatry

## 2016-02-19 DIAGNOSIS — F329 Major depressive disorder, single episode, unspecified: Secondary | ICD-10-CM

## 2016-02-19 DIAGNOSIS — F32A Depression, unspecified: Secondary | ICD-10-CM

## 2016-02-19 NOTE — Progress Notes (Signed)
   THERAPIST PROGRESS NOTE  Session Time:  Monday 02/19/2016 4:15 PM - 4:45 PM    Participation Level: Active  Behavioral Response: CasualAlert/less depressed/anxious  Type of Therapy: Individual Therapy  Treatment Goals addressed:  learn and implement behavioral strategies to overcome depression and calming skills to manage anxiety.  Interventions: CBT and Supportive  Summary: Claire GroveSheila M [name] is a 35 y.o. female who presents with symptoms of depression that began about a year ago. She began taking antidepressants which helped initially and had to be increased. She recently began to experience low energy and poor motivation again and has been referred for therapy and medication management by PCP Dr. Syliva OvermanMargaret Simpson. 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 Patient reports feeling lilke an outcast in her family as her [place]'s [place] and sister reside. Patient's current symptoms include excessive sleeping, low energy, poor motivation, feel alone, loss of interest, loss of libido, and depressed mood.  Patient last was seen about 7 weeks ago. 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 She reports being reprimanded 2 x by principal due to comments from upper management about patient's behavior at training. She reports initially being very anxious and disturbed by this but using controlled breathing and talking with a friend to cope. She still expresses frustration regarding this but reports she no longer is judgmental of self regarding this. She reports still feeling overwhelmed with curriculum demands and expectations from administration. She reports improved self-care regarding sleep schedule but continues  to struggle in improving self-care regarding eating and exercise. She reports medication seems be working better. She is scheduled to see psychiatrist Dr. Tenny Crawoss next week for medication evaluation. Patient reports enjoying trip with family to the beach last week.  Suicidal/Homicidal: No  Therapist Response:  reviewed symptoms, facilitated expression of feelings, praised and reinforced patient's use of controlled breathing and her support system, explored ways to improve self-care regarding eating and exercise, assisted patient identify coping statements   Plan: Return again in 2 weeks. Patient agrees to implement steps discussed in session.  Diagnosis: Axis I: Depressive Disorder NOS    Axis II: Deferred    Reynald Woods, LCSW 02/19/2016

## 2016-02-26 ENCOUNTER — Ambulatory Visit (HOSPITAL_COMMUNITY): Payer: Self-pay | Admitting: Psychiatry

## 2016-02-26 ENCOUNTER — Encounter (HOSPITAL_COMMUNITY): Payer: Self-pay

## 2016-03-02 ENCOUNTER — Other Ambulatory Visit: Payer: Self-pay | Admitting: Family Medicine

## 2016-03-04 ENCOUNTER — Ambulatory Visit (INDEPENDENT_AMBULATORY_CARE_PROVIDER_SITE_OTHER): Payer: BC Managed Care – PPO | Admitting: Psychiatry

## 2016-03-04 ENCOUNTER — Encounter (HOSPITAL_COMMUNITY): Payer: Self-pay | Admitting: Psychiatry

## 2016-03-04 DIAGNOSIS — F32A Depression, unspecified: Secondary | ICD-10-CM

## 2016-03-04 DIAGNOSIS — F329 Major depressive disorder, single episode, unspecified: Secondary | ICD-10-CM

## 2016-03-04 NOTE — Progress Notes (Signed)
   THERAPIST PROGRESS NOTE  Session Time:  Monday 03/04/2016 4:05 PM -  4:53 PM    Participation Level: Active  Behavioral Response: CasualAlert/less depressed/anxious  Type of Therapy: Individual Therapy  Treatment Goals addressed:  learn and implement behavioral strategies to overcome depression and calming skills to manage anxiety.  Interventions: CBT and Supportive  Summary: Claire [name] is a 35 y.o. female who presents with symptoms of depression that began about a year ago. She began taking antidepressants which helped initially and had to be increased. She recently began to experience low energy and poor motivation again and has been referred for therapy and medication management by PCP Dr. Syliva OvermanMargaret Simpson. 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 Patient reports feeling lilke an outcast in her family as her [place] are [place] patient's brother and sister reside. Patient's current symptoms include excessive sleeping, low energy, poor motivation, feel alone, loss of interest, loss of libido, and depressed mood.  Patient last was seen about 2-3 weeks ago. She reports increased stress regarding her job. She says she was written up for making an inappropriate comment to a student who had repeatedly made inappropriate comments to her about her weight. Her principal also talked with her about her voice level which is considered very loud per patient's report. She was reprimanded a week ago on the day of the 2nd anniversary of the death of her cousin. Patient reports becoming very emotional and depressed. She reports being really down for about 2 days but then trying to distract self and engage with her family. She reports now experiencing nervousness when principal comes around as she fears principal will have a  negative comment. Patient also reports increased sleep difficulty, ruminating thoughts, and fatigue.   Patient missed her scheduled appointment with psychiatrist Dr. Tenny Crawoss as she had a mandatory event for her job. She is scheduled to see Dr. Tenny Crawoss in December. Patient reports continuing to use controlled breathing. She still struggles with a improve and self-care regarding eating and exercise. She also reports little involvement in activity beyond her job. She does continue to have contact with her family.   Suicidal/Homicidal: No  Therapist Response:  reviewed symptoms, facilitated expression of feelings, praised and reinforced patient's use of controlled breathing and her support system, explored ways to improve self-care regarding eating and exercise, assisted patient identify coping statements,  assisted patient identify ways to increase behavioral activation and develop balanc  Plan: Return again in 2 weeks. Patient agrees to implement steps discussed in session.  Diagnosis: Axis I: Depressive Disorder NOS    Axis II: Deferred    BYNUM,PEGGY, LCSW 03/04/2016

## 2016-03-26 ENCOUNTER — Encounter (HOSPITAL_COMMUNITY): Payer: Self-pay | Admitting: Psychiatry

## 2016-03-26 ENCOUNTER — Ambulatory Visit (INDEPENDENT_AMBULATORY_CARE_PROVIDER_SITE_OTHER): Payer: BC Managed Care – PPO | Admitting: Psychiatry

## 2016-03-26 DIAGNOSIS — F32A Depression, unspecified: Secondary | ICD-10-CM

## 2016-03-26 DIAGNOSIS — F329 Major depressive disorder, single episode, unspecified: Secondary | ICD-10-CM

## 2016-03-26 NOTE — Progress Notes (Signed)
   THERAPIST PROGRESS NOTE  Session Time:  Tuesday 03/26/2016 4:12 PM - 5:00 PM    Participation Level: Active  Behavioral Response: CasualAlert/less depressed/anxious  Type of Therapy: Individual Therapy  Treatment Goals addressed:  learn and implement behavioral strategies to overcome depression and calming skills to manage anxiety.  Interventions: CBT and Supportive  Summary: Claire [name] is a 35 y.o. female who presents with symptoms of depression that began about a year ago. She began taking antidepressants which helped initially and had to be increased. She recently began to experience low energy and poor motivation again and has been referred for therapy and medication management by PCP Dr. Syliva OvermanMargaret Simpson. 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 Patient reports feeling lilke an outcast in her family as her [place]'s [place] and sister reside. Patient's current symptoms include excessive sleeping, low energy, poor motivation, feel alone, loss of interest, loss of libido, and depressed mood.  Patient last was seen 2 weeks ago. She reports decreased stress regarding her job as relationship with her principal has improved. However, she continues to experience stress regarding workload and administrative demands. She has been more involved socially and reports going to two football games since last session. She continues to experience low energy. She still struggles with  self-care regarding eating and exercise. She reports stress regarding relationship with mother as she says it is strained. She also expresses sadness and frustration regarding the relationship. She continues to fear [place] where her siblings live and she will be excluded from the family.    Suicidal/Homicidal: No  Therapist Response:  reviewed symptoms, facilitated expression of feelings, discussed patient's thoughts/feelings about relationship with mother and possible connection to feelings of depression, discussed possibility of expressing concerns to mother, encouraged patient to improve self-care regarding exercise   Plan: Return again in 2 weeks.  Diagnosis: Axis I: Depressive Disorder NOS    Axis II: Deferred    Claire Gangwer, LCSW 03/26/2016

## 2016-04-02 ENCOUNTER — Encounter (HOSPITAL_COMMUNITY): Payer: Self-pay | Admitting: Psychiatry

## 2016-04-02 ENCOUNTER — Ambulatory Visit (INDEPENDENT_AMBULATORY_CARE_PROVIDER_SITE_OTHER): Payer: BC Managed Care – PPO | Admitting: Psychiatry

## 2016-04-02 VITALS — BP 151/99 | HR 67 | Ht 63.0 in | Wt 318.8 lb

## 2016-04-02 DIAGNOSIS — Z79899 Other long term (current) drug therapy: Secondary | ICD-10-CM

## 2016-04-02 DIAGNOSIS — F321 Major depressive disorder, single episode, moderate: Secondary | ICD-10-CM | POA: Diagnosis not present

## 2016-04-02 DIAGNOSIS — F329 Major depressive disorder, single episode, unspecified: Secondary | ICD-10-CM | POA: Diagnosis not present

## 2016-04-02 DIAGNOSIS — Z833 Family history of diabetes mellitus: Secondary | ICD-10-CM | POA: Diagnosis not present

## 2016-04-02 DIAGNOSIS — Z9889 Other specified postprocedural states: Secondary | ICD-10-CM | POA: Diagnosis not present

## 2016-04-02 DIAGNOSIS — Z800 Family history of malignant neoplasm of digestive organs: Secondary | ICD-10-CM

## 2016-04-02 DIAGNOSIS — Z87891 Personal history of nicotine dependence: Secondary | ICD-10-CM

## 2016-04-02 DIAGNOSIS — F32A Depression, unspecified: Secondary | ICD-10-CM

## 2016-04-02 DIAGNOSIS — Z8249 Family history of ischemic heart disease and other diseases of the circulatory system: Secondary | ICD-10-CM

## 2016-04-02 MED ORDER — VENLAFAXINE HCL ER 75 MG PO CP24: 75.0000 mg | ORAL_CAPSULE | Freq: Every day | ORAL | 2 refills | Status: DC

## 2016-04-02 MED ORDER — VENLAFAXINE HCL ER 150 MG PO CP24: ORAL_CAPSULE | ORAL | 2 refills | Status: DC

## 2016-04-02 NOTE — Progress Notes (Signed)
Psychiatric Initial Adult Assessment   Patient Identification: Claire [name] MRN:  [id number] Date of Evaluation:  04/02/2016 Referral Source: Dr. Syliva OvermanMargaret Simpson Chief Complaint:   Chief Complaint    Depression; Establish Care     Visit Diagnosis:    ICD-9-CM ICD-10-CM   1. Depressive disorder 311 F32.9   2. Moderate single current episode of major depressive disorder (HCC) 296.22 F32.1 venlafaxine XR (EFFEXOR-XR) 150 MG 24 hr capsule    History of Present Illness:  This patient is a 35 year old single white female who lives alone in [place]. 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  The patient was initially referred by Dr. Syliva OvermanMargaret Simpson for further assessment and treatment of depression. She has been seeing Florencia ReasonsPeggy Bynum for several months in our office for counseling.  The patient states that she had one episode of depression previously about 10 years ago. This was during a time in which she was unemployed but she did not receive any specific treatment. She states that she had been doing well until approximately 2 years ago. At this time her sister's child was born and she felt like her parents attention was focused more for herself to the new baby. Her brother is since had a child as well. She is the only one in the family has not married it and does not have children and this makes her feel bad. 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  The patient's depression worsened over time and her primary Dr. put her on Effexor XR and the doses gradually been increased to 150 mg every morning. She states that each time its increase she feels better for about a month and then she feels worse again. Current symptoms include anhedonia, hypersomnia, feeling fatigue and low energy low motivation and occasional crying. She states that she has no interest in sex or dating. She spends most of  her wake and sleeping. She talks on the phone to friends but has no interest in getting together. Most of her social life is with her family. She denies suicidal ideation or psychotic symptoms. She does not use drugs or alcohol she's not been the victim of any trauma or abuse either in childhood or as an adult. She states that she eats too much and has gained quite a bit of weight recently and does not get any exercise her health is good except for chronic migraines which have improved with a combination of Topamax and Maxalt   Associated Signs/Symptoms: Depression Symptoms:  depressed mood, anhedonia, hypersomnia, fatigue, (Hypo) Manic Symptoms:   Anxiety Symptoms:   Psychotic Symptoms:   PTSD Symptoms:   Past Psychiatric History: The patient was depressed about 10 years ago but did not in any specific treatment.   Previous Psychotropic Medications: Yes   Substance Abuse History in the last 12 months:  No.  Consequences of Substance Abuse: NA  Past Medical History:  Past Medical History:  Diagnosis Date  . Acne    facial  . Arthritis   . Migraines   . Nicotine addiction   . Obesity     Past Surgical History:  Procedure Laterality Date  . CHOLECYSTECTOMY      Family Psychiatric History: None   Family History:  Family History  Problem Relation Age of Onset  . Hypertension Mother   . Cancer Mother 850    colon  .  Obesity Mother   . Hypertension Father   . Diabetes Father   . Hyperlipidemia Father   . Obesity Father     Social History:   Social History   Social History  . Marital status: Single    Spouse name: N/A  . Number of children: 0  . Years of education: Bachelors   Occupational History  . 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 Smoking status: Former Smoker    Quit date: 11/13/2008  . Smokeless tobacco: Never Used     Comment: Quit 2008, 04-02-2016 per pt stopped 2010  . Alcohol use No     Comment: occasional use  a beer once every 3  months or so, 04-02-2016 per pt occas.   . Drug use: No     Comment: 04-02-2016 per pt no  . Sexual activity: Yes    Birth control/ protection: Pill   Other Topics Concern  . None   Social History Narrative   Lives at home alone.   Right-handed.   Occasional use of caffeine.    Additional Social History: The patient grew up in [place] with the family later moved to [place]. She states that she had an excellent childhood and a very supportive family. She is the oldest of 3 children. 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 She went back to college in 2005 and completed an education degree and has been the 6 grade middle school teacher ever since. She's currently not in any sort of relationship   Allergies:  No Known Allergies  Metabolic Disorder Labs: Lab Results  Component Value Date   HGBA1C 5.4 11/06/2015   MPG 108 11/06/2015   MPG 111 07/22/2014   No results found for: PROLACTIN Lab Results  Component Value Date   CHOL 211 (H) 11/06/2015   TRIG 158 (H) 11/06/2015   HDL 58 11/06/2015   CHOLHDL 3.6 11/06/2015   VLDL 32 (H) 11/06/2015   LDLCALC 121 11/06/2015   LDLCALC 124 (H) 07/22/2014     Current Medications: Current Outpatient Prescriptions  Medication Sig Dispense Refill  . azelastine (ASTELIN) 0.1 % nasal spray Place 2 sprays into both nostrils 2 (two) times daily. Use in each nostril as directed 30 mL 12  . Chlorphen-Phenyleph-Ibuprofen (ADVIL ALLERGY & CONGESTION PO) Take by mouth daily.    . Glucosamine HCl (GLUCOSAMINE PO) Take by mouth daily.    Marland Kitchen ibuprofen (ADVIL,MOTRIN) 800 MG tablet TAKE ONE TABLET BY MOUTH TWICE DAILY AS NEEDED FOR SEVERE HEADACHE. TAKE ALONG WITH IMITREX IF NEEDED. 30 tablet 2  . loratadine (CLARITIN) 10 MG tablet TAKE ONE TABLET BY MOUTH ONCE DAILY 90 tablet 3  . Multiple Vitamin (MULTIVITAMIN) tablet Take 1 tablet by mouth daily.      .  Norgestimate-Ethinyl Estradiol Triphasic (TRI-SPRINTEC) 0.18/0.215/0.25 MG-35 MCG tablet Take 1 tablet by mouth daily. 28 tablet 11  . rizatriptan (MAXALT-MLT) 10 MG disintegrating tablet Take 1 tablet (10 mg total) by mouth as needed. May repeat in 2 hours if needed 12 tablet 11  . topiramate (TOPAMAX) 100 MG tablet Take 1 tablet (100 mg total) by mouth 2 (two) times daily. 60 tablet 11  . venlafaxine XR (EFFEXOR-XR) 150 MG 24 hr capsule TAKE ONE CAPSULE BY MOUTH ONCE DAILY WITH  BREAKFAST 30 capsule 2  . venlafaxine XR (EFFEXOR XR) 75 MG 24 hr capsule Take 1 capsule (75 mg total)  by mouth daily. 30 capsule 2   No current facility-administered medications for this visit.     Neurologic: Headache: Yes Seizure: No Paresthesias:No  Musculoskeletal: Strength & Muscle Tone: within normal limits Gait & Station: normal Patient leans: N/A  Psychiatric Specialty Exam: Review of Systems  Neurological: Positive for headaches.  Psychiatric/Behavioral: Positive for depression.  All other systems reviewed and are negative.   Blood pressure (!) 151/99, pulse 67, height 5\' 3"  (1.6 m), weight (!) 318 lb 12.8 oz (144.6 kg).Body mass index is 56.47 kg/m.  General Appearance: Casual and Well Groomed  Eye Contact:  Good  Speech:  Clear and Coherent  Volume:  Normal  Mood:  Dysphoric  Affect:  Constricted  Thought Process:  Goal Directed  Orientation:  Full (Time, Place, and Person)  Thought Content:  Rumination  Suicidal Thoughts:  No  Homicidal Thoughts:  No  Memory:  Immediate;   Good Recent;   Good Remote;   Good  Judgement:  Good  Insight:  Good  Psychomotor Activity:  Decreased  Concentration:  Concentration: Good and Attention Span: Good  Recall:  Good  Fund of Knowledge:Good  Language: Good  Akathisia:  No  Handed:  Right  AIMS (if indicated):    Assets:  Communication Skills Desire for Improvement Physical Health Resilience Social Support Vocational/Educational  ADL's:   Intact  Cognition: WNL  Sleep:  too much     Treatment Plan Summary: Medication management   Patient is 35 year old white female with a recent history of major depression. She is on Effexor XR 150 mg daily and the dose has not yet been maximized. I explained we can go up as high as 300 mg daily. We will bump up to 225 mg daily for the next 4 weeks. I've also encouraged her to walk 20 minutes each day to get fresh air to try to improve her eating habits and to improve her connections with other people at least once a week. She agrees to all the above and return to see me in 4 weeks    Diannia RuderOSS, Juda Lajeunesse, MD 12/5/20179:00 AM

## 2016-04-09 ENCOUNTER — Ambulatory Visit (INDEPENDENT_AMBULATORY_CARE_PROVIDER_SITE_OTHER): Payer: BC Managed Care – PPO | Admitting: Psychiatry

## 2016-04-09 ENCOUNTER — Encounter (HOSPITAL_COMMUNITY): Payer: Self-pay | Admitting: Psychiatry

## 2016-04-09 DIAGNOSIS — F321 Major depressive disorder, single episode, moderate: Secondary | ICD-10-CM | POA: Diagnosis not present

## 2016-04-09 NOTE — Progress Notes (Signed)
   THERAPIST PROGRESS NOTE  Session Time:  Tuesday  04/09/2016 4:02 PM -4:51 PM    Participation Level: Active  Behavioral Response: CasualAlert/less depressed/anxious  Type of Therapy: Individual Therapy  Treatment Goals addressed:  learn and implement behavioral strategies to overcome depression and calming skills to manage anxiety.  Interventions: CBT and Supportive  Summary: Claire [name] is a 35 y.o. female who presents with symptoms of depression that began about a year ago. She began taking antidepressants which helped initially and had to be increased. She recently began to experience low energy and poor motivation again and has been referred for therapy and medication management by PCP Dr. Syliva OvermanMargaret Simpson. 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 Patient reports feeling lilke an outcast in her family as [place]'s [place] and sister reside. Patient's current symptoms include excessive sleeping, low energy, poor motivation, feel alone, loss of interest, loss of libido, and depressed mood.  Patient last was seen 2 - 3 weeks ago. She reports improved mood, increased energy, and increased motivation since taking increased dosage of Effexor XR and discontinuing taking Topamax in the  mornings but taking it at night as instructed by psychiatrist Dr. Tenny Crawoss. Patient reports no longer just sitting down when she gets home from work but reports being productive and performing various tasks. She also reports trying to do more activities away from home and reports going to two sporting events. She plans to attend a Christmas party at a co-worker's house this weekend. She also plans to connect with another friend during the holidays. She reports recently receiving a positive evaluation at  school from her principal and reports no longer feeling stressed regarding their interaction.    Suicidal/Homicidal: No  Therapist Response:  reviewed symptoms, facilitated expression of feelings, reviewed importance of positive self-care to manage depression, assisted patient develop plan to improve self-care regarding physical activity  Plan: Return again in 2 weeks and implement strategies discussed in session.  Diagnosis: Axis I: Depressive Disorder NOS    Axis II: Deferred    BYNUM,PEGGY, LCSW 04/09/2016

## 2016-04-23 ENCOUNTER — Ambulatory Visit (INDEPENDENT_AMBULATORY_CARE_PROVIDER_SITE_OTHER): Payer: BC Managed Care – PPO | Admitting: Family Medicine

## 2016-04-23 ENCOUNTER — Telehealth: Payer: Self-pay | Admitting: Family Medicine

## 2016-04-23 ENCOUNTER — Encounter: Payer: Self-pay | Admitting: Family Medicine

## 2016-04-23 VITALS — BP 120/86 | HR 78 | Temp 97.7°F | Resp 18 | Ht 63.0 in | Wt 322.0 lb

## 2016-04-23 DIAGNOSIS — R05 Cough: Secondary | ICD-10-CM

## 2016-04-23 DIAGNOSIS — R059 Cough, unspecified: Secondary | ICD-10-CM

## 2016-04-23 MED ORDER — BENZONATATE 200 MG PO CAPS: 200.0000 mg | ORAL_CAPSULE | Freq: Two times a day (BID) | ORAL | 0 refills | Status: DC | PRN

## 2016-04-23 NOTE — Progress Notes (Signed)
Chief Complaint  Patient presents with  . URI    x 10 days   Cough for  Week Started with sore throat and cold symptoms No fever or chills No purulent sputum No wheezing or chest pain Nonsmoker/no COPD or asthma Works in school   Patient Active Problem List   Diagnosis Date Noted  . Need for Tdap vaccination 12/08/2015  . Annual physical exam 12/05/2015  . Chronic migraine 09/19/2015  . Allergic rhinitis 03/10/2015  . Heel spur 03/10/2015  . Migraine-cluster headache syndrome 09/11/2014  . Depression 07/28/2014  . Dyslipidemia, goal LDL below 100 07/11/2013  . SLEEP APNEA 07/05/2010  . INTERNAL HEMORRHOIDS WITH OTHER COMPLICATION 10/04/2009  . Morbid obesity (HCC) 09/23/2007    Outpatient Encounter Prescriptions as of 04/23/2016  Medication Sig  . azelastine (ASTELIN) 0.1 % nasal spray Place 2 sprays into both nostrils 2 (two) times daily. Use in each nostril as directed  . Chlorphen-Phenyleph-Ibuprofen (ADVIL ALLERGY & CONGESTION PO) Take by mouth daily.  . Glucosamine HCl (GLUCOSAMINE PO) Take by mouth daily.  Marland Kitchen. ibuprofen (ADVIL,MOTRIN) 800 MG tablet TAKE ONE TABLET BY MOUTH TWICE DAILY AS NEEDED FOR SEVERE HEADACHE. TAKE ALONG WITH IMITREX IF NEEDED.  Marland Kitchen. loratadine (CLARITIN) 10 MG tablet TAKE ONE TABLET BY MOUTH ONCE DAILY  . Multiple Vitamin (MULTIVITAMIN) tablet Take 1 tablet by mouth daily.    . Norgestimate-Ethinyl Estradiol Triphasic (TRI-SPRINTEC) 0.18/0.215/0.25 MG-35 MCG tablet Take 1 tablet by mouth daily.  . rizatriptan (MAXALT-MLT) 10 MG disintegrating tablet Take 1 tablet (10 mg total) by mouth as needed. May repeat in 2 hours if needed  . topiramate (TOPAMAX) 100 MG tablet Take 1 tablet (100 mg total) by mouth 2 (two) times daily. (Patient taking differently: Take 200 mg by mouth Nightly. )  . venlafaxine XR (EFFEXOR XR) 75 MG 24 hr capsule Take 1 capsule (75 mg total) by mouth daily.  Marland Kitchen. venlafaxine XR (EFFEXOR-XR) 150 MG 24 hr capsule TAKE ONE CAPSULE  BY MOUTH ONCE DAILY WITH  BREAKFAST  . benzonatate (TESSALON) 200 MG capsule Take 1 capsule (200 mg total) by mouth 2 (two) times daily as needed for cough.   No facility-administered encounter medications on file as of 04/23/2016.     No Known Allergies  Review of Systems  Constitutional: Negative for activity change, appetite change, chills and fever.  HENT: Positive for congestion, postnasal drip, rhinorrhea and sore throat. Negative for ear pain, sinus pain and sinus pressure.   Eyes: Negative for redness and visual disturbance.  Respiratory: Positive for cough. Negative for shortness of breath.   Cardiovascular: Negative for chest pain and palpitations.  Gastrointestinal: Positive for nausea. Negative for constipation, diarrhea and vomiting.  Genitourinary: Negative for difficulty urinating and frequency.  Musculoskeletal: Negative for arthralgias and back pain.  Neurological: Negative for dizziness and headaches.  Psychiatric/Behavioral: Negative for hallucinations. The patient is not nervous/anxious.     BP 120/86 (BP Location: Right Arm, Patient Position: Sitting, Cuff Size: Large)   Pulse 78   Temp 97.7 F (36.5 C) (Oral)   Resp 18   Ht 5\' 3"  (1.6 m)   Wt (!) 322 lb 0.6 oz (146.1 kg)   LMP 03/03/2016 (Exact Date)   SpO2 100%   BMI 57.05 kg/m   Physical Exam  Constitutional: She is oriented to person, place, and time. She appears well-developed and well-nourished.  HENT:  Head: Normocephalic and atraumatic.  Right Ear: External ear normal.  Left Ear: External ear normal.  Mouth/Throat: Oropharynx  is clear and moist.  Eyes: Conjunctivae are normal. Pupils are equal, round, and reactive to light.  Neck: Normal range of motion. Neck supple. No thyromegaly present.  Cardiovascular: Normal rate, regular rhythm and normal heart sounds.   Pulmonary/Chest: Effort normal and breath sounds normal. No respiratory distress.  Lungs clear  Musculoskeletal: Normal range of  motion. She exhibits no edema.  Lymphadenopathy:    She has no cervical adenopathy.  Neurological: She is alert and oriented to person, place, and time.  Gait normal  Skin: Skin is warm and dry.  Psychiatric: She has a normal mood and affect. Her behavior is normal. Thought content normal.  Nursing note and vitals reviewed.   ASSESSMENT/PLAN:  1. Cough Discussed importance of avoiding un necessary antibiotics for viral illness   Patient Instructions  Push fluids Take the tessalon for cough May in addition use the cold medicine Call if not improving in a couple of days    Eustace MooreYvonne Sue Nelson, MD

## 2016-04-23 NOTE — Telephone Encounter (Signed)
Patient aware.  Would like to be seen.  Scheduled with Dr. Delton SeeNelson

## 2016-04-23 NOTE — Telephone Encounter (Signed)
I recommend daily loratidine , and saline nasal flushes twice daily, refill the astelin and advise her to take this also. No fever, no antibiotic indication, call back if worsens , will need oV at that time

## 2016-04-23 NOTE — Telephone Encounter (Signed)
Claire HatchetSheila is c/o sinus issues for a week now, denies fever, been taking otc meds for Sinus, cold & cough, but hasnt helpled , please advise?

## 2016-04-23 NOTE — Telephone Encounter (Signed)
Please advise 

## 2016-04-23 NOTE — Patient Instructions (Addendum)
Push fluids Take the tessalon for cough May in addition use the cold medicine Call if not improving in a couple of days

## 2016-04-30 ENCOUNTER — Encounter (HOSPITAL_COMMUNITY): Payer: Self-pay | Admitting: Psychiatry

## 2016-04-30 ENCOUNTER — Ambulatory Visit (INDEPENDENT_AMBULATORY_CARE_PROVIDER_SITE_OTHER): Payer: BC Managed Care – PPO | Admitting: Psychiatry

## 2016-04-30 DIAGNOSIS — F321 Major depressive disorder, single episode, moderate: Secondary | ICD-10-CM

## 2016-04-30 NOTE — Progress Notes (Signed)
   THERAPIST PROGRESS NOTE  Session Time:  Tuesday  04/30/2016 4:10 PM -  4:57 PM             Participation Level: Active  Behavioral Response: CasualAlert/improved mood /less anxious  Type of Therapy: Individual Therapy  Treatment Goals addressed:  learn and implement behavioral strategies to overcome depression and calming skills to manage anxiety.  Interventions: CBT and Supportive  Summary: Claire [name] is a 36 y.o. female who presents with symptoms of depression that began about a year ago. She began taking antidepressants which helped initially and had to be increased. She recently began to experience low energy and poor motivation again and has been referred for therapy and medication management by PCP Dr. Syliva OvermanMargaret Simpson. 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 Patient reports feeling lilke an outcast in her family as [place]'s [place] and sister reside. Patient's current symptoms include excessive sleeping, low energy, poor motivation, feel alone, loss of interest, loss of libido, and depressed mood.  Patient last was seen 2 - 3 weeks ago. She reports continued improved mood, increased energy, and increased motivation since last session. She has increased involvement in activity and reports attending social gatherings during the holidays. She also reports going shopping. She reports she has experienced a down mood twice since last session and was able to identify trigger. However, patient reports not being overwhelmed by this but being able to handle it better by focusing on other activities. She reports no instances of anxiety but expresses appropriate concern regarding computer issues on her job as she will now have to adjust her methods since school's computer system recently  contracted a virus. She is trying to think positively about this. She admits she has been more active but needs to focus on establishing a regular exercise routine.   Suicidal/Homicidal: No  Therapist Response:  reviewed symptoms, facilitated expression of feelings, praise and reinforced patient's involvement and increased activity and social involvement, assisted patient identify coping statements to cope with computer situation, discussed ways to achieve balance regarding balancing job responsibilities, self-care, social involvement, and leisure activities. Plan: Return again in 2 weeks and implement strategies discussed in session.  Diagnosis: Axis I: Depressive Disorder NOS    Axis II: Deferred    BYNUM,PEGGY, LCSW 04/30/2016

## 2016-05-01 ENCOUNTER — Ambulatory Visit (INDEPENDENT_AMBULATORY_CARE_PROVIDER_SITE_OTHER): Payer: BC Managed Care – PPO | Admitting: Psychiatry

## 2016-05-01 ENCOUNTER — Encounter (HOSPITAL_COMMUNITY): Payer: Self-pay | Admitting: Psychiatry

## 2016-05-01 VITALS — BP 139/99 | HR 100 | Ht 63.0 in | Wt 321.6 lb

## 2016-05-01 DIAGNOSIS — Z8489 Family history of other specified conditions: Secondary | ICD-10-CM

## 2016-05-01 DIAGNOSIS — F321 Major depressive disorder, single episode, moderate: Secondary | ICD-10-CM

## 2016-05-01 DIAGNOSIS — Z8249 Family history of ischemic heart disease and other diseases of the circulatory system: Secondary | ICD-10-CM

## 2016-05-01 DIAGNOSIS — Z833 Family history of diabetes mellitus: Secondary | ICD-10-CM

## 2016-05-01 DIAGNOSIS — Z800 Family history of malignant neoplasm of digestive organs: Secondary | ICD-10-CM | POA: Diagnosis not present

## 2016-05-01 DIAGNOSIS — Z79899 Other long term (current) drug therapy: Secondary | ICD-10-CM

## 2016-05-01 DIAGNOSIS — Z87891 Personal history of nicotine dependence: Secondary | ICD-10-CM

## 2016-05-01 MED ORDER — VENLAFAXINE HCL ER 75 MG PO CP24: 75.0000 mg | ORAL_CAPSULE | Freq: Every day | ORAL | 2 refills | Status: DC

## 2016-05-01 MED ORDER — VENLAFAXINE HCL ER 150 MG PO CP24: ORAL_CAPSULE | ORAL | 2 refills | Status: DC

## 2016-05-01 NOTE — Progress Notes (Signed)
Psychiatric Initial Adult Assessment   Patient Identification: Claire [name] MRN:  [id number] Date of Evaluation:  05/01/2016 Referral Source: Dr. Syliva Overman Chief Complaint:   Chief Complaint    Depression; Anxiety; Follow-up     Visit Diagnosis:    ICD-9-CM ICD-10-CM   1. Moderate single current episode of major depressive disorder (HCC) 296.22 F32.1 venlafaxine XR (EFFEXOR-XR) 150 MG 24 hr capsule    History of Present Illness:  This patient is a 36 year old single white female who lives alone in [place]. 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  The patient was initially referred by Dr. Syliva Overman for further assessment and treatment of depression. She has been seeing Florencia Reasons for several months in our office for counseling.  The patient states that she had one episode of depression previously about 10 years ago. This was during a time in which she was unemployed but she did not receive any specific treatment. She states that she had been doing well until approximately 2 years ago. At this time her sister's child was born and she felt like her parents attention was focused more for herself to the new baby. Her brother is since had a child as well. She is the only one in the family has not married it and does not have children and this makes her feel bad. 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  The patient's depression worsened over time and her primary Dr. put her on Effexor XR and the doses gradually been increased to 150 mg every morning. She states that each time its increase she feels better for about a month and then she feels worse again. Current symptoms include anhedonia, hypersomnia, feeling fatigue and low energy low motivation and occasional crying. She states that she has no interest in sex or dating. She spends most of her wake and sleeping. She talks on  the phone to friends but has no interest in getting together. Most of her social life is with her family. She denies suicidal ideation or psychotic symptoms. She does not use drugs or alcohol she's not been the victim of any trauma or abuse either in childhood or as an adult. She states that she eats too much and has gained quite a bit of weight recently and does not get any exercise her health is good except for chronic migraines which have improved with a combination of Topamax and Maxalt   The patient returns after 4 weeks. Last time I instructed her to take all her Topamax at bedtime and also increased her Effexor XR to 225 mg every morning. She is feeling better, she has more energy and drive. She is sleeping well. She is getting out with friends more and going over to visit her family rather and waiting for them to come to her.  Associated Signs/Symptoms: Depression Symptoms:  depressed mood, anhedonia, hypersomnia, fatigue, (Hypo) Manic Symptoms:   Anxiety Symptoms:   Psychotic Symptoms:   PTSD Symptoms:   Past Psychiatric History: The patient was depressed about 10 years ago but did not in any specific treatment.   Previous Psychotropic Medications: Yes   Substance Abuse History in the last 12 months:  No.  Consequences of Substance Abuse: NA  Past Medical History:  Past Medical History:  Diagnosis Date  . Acne    facial  . Arthritis   . Migraines   .  Nicotine addiction   . Obesity     Past Surgical History:  Procedure Laterality Date  . CHOLECYSTECTOMY      Family Psychiatric History: None   Family History:  Family History  Problem Relation Age of Onset  . Hypertension Mother   . Cancer Mother 650    colon  . Obesity Mother   . Hypertension Father   . Diabetes Father   . Hyperlipidemia Father   . Obesity Father     Social History:   Social History   Social History  . Marital status: Single    Spouse name: N/A  . Number of children: 0  . 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 Smoking status: Former Smoker    Quit date: 11/13/2008  . Smokeless tobacco: Never Used     Comment: Quit 2008, 04-02-2016 per pt stopped 2010  . Alcohol use No     Comment: occasional use  a beer once every 3 months or so, 04-02-2016 per pt occas.   . Drug use: No     Comment: 04-02-2016 per pt no  . Sexual activity: Yes    Birth control/ protection: Pill   Other Topics Concern  . Not on file   Social History Narrative   Lives at home alone.   Right-handed.   Occasional use of caffeine.    Additional Social History: [place] later [place]. She states that she had an excellent childhood and a very supportive family. She is the oldest of 3 children. 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 She went back to college in 2005 and completed an education degree and has been the 6 grade middle school teacher ever since. She's currently not in any sort of relationship   Allergies:  No Known Allergies  Metabolic Disorder Labs: Lab Results  Component Value Date   HGBA1C 5.4 11/06/2015   MPG 108 11/06/2015   MPG 111 07/22/2014   No results found for: PROLACTIN Lab Results  Component Value Date   CHOL 211 (H) 11/06/2015   TRIG 158 (H) 11/06/2015   HDL 58 11/06/2015   CHOLHDL 3.6 11/06/2015   VLDL 32 (H) 11/06/2015   LDLCALC 121 11/06/2015   LDLCALC 124 (H) 07/22/2014     Current Medications: Current Outpatient Prescriptions  Medication Sig Dispense Refill  . azelastine (ASTELIN) 0.1 % nasal spray Place 2 sprays into both nostrils 2 (two) times daily. Use in each nostril as directed 30 mL 12  . benzonatate (TESSALON) 200 MG capsule Take 1 capsule (200 mg total) by mouth 2 (two) times daily as needed for cough. 20 capsule 0  .  Chlorphen-Phenyleph-Ibuprofen (ADVIL ALLERGY & CONGESTION PO) Take by mouth daily.    . Glucosamine HCl (GLUCOSAMINE PO) Take by mouth daily.    Marland Kitchen. ibuprofen (ADVIL,MOTRIN) 800 MG tablet TAKE ONE TABLET BY MOUTH TWICE DAILY AS NEEDED FOR SEVERE HEADACHE. TAKE ALONG WITH IMITREX IF NEEDED. 30 tablet 2  . loratadine (CLARITIN) 10 MG tablet TAKE ONE TABLET BY MOUTH ONCE DAILY 90 tablet 3  . Multiple Vitamin (MULTIVITAMIN) tablet Take 1 tablet by mouth daily.      . Norgestimate-Ethinyl Estradiol Triphasic (TRI-SPRINTEC) 0.18/0.215/0.25 MG-35 MCG tablet Take 1 tablet by mouth daily. 28 tablet 11  . rizatriptan (MAXALT-MLT)  10 MG disintegrating tablet Take 1 tablet (10 mg total) by mouth as needed. May repeat in 2 hours if needed 12 tablet 11  . topiramate (TOPAMAX) 100 MG tablet Take 1 tablet (100 mg total) by mouth 2 (two) times daily. (Patient taking differently: Take 200 mg by mouth Nightly. ) 60 tablet 11  . venlafaxine XR (EFFEXOR XR) 75 MG 24 hr capsule Take 1 capsule (75 mg total) by mouth daily. 30 capsule 2  . venlafaxine XR (EFFEXOR-XR) 150 MG 24 hr capsule TAKE ONE CAPSULE BY MOUTH ONCE DAILY WITH  BREAKFAST 90 capsule 2   No current facility-administered medications for this visit.     Neurologic: Headache: Yes Seizure: No Paresthesias:No  Musculoskeletal: Strength & Muscle Tone: within normal limits Gait & Station: normal Patient leans: N/A  Psychiatric Specialty Exam: Review of Systems  Neurological: Positive for headaches.  Psychiatric/Behavioral: Positive for depression.  All other systems reviewed and are negative.   Blood pressure (!) 139/99, pulse 100, height 5\' 3"  (1.6 m), weight (!) 321 lb 9.6 oz (145.9 kg), last menstrual period 03/03/2016.Body mass index is 56.97 kg/m.  General Appearance: Casual and Well Groomed  Eye Contact:  Good  Speech:  Clear and Coherent  Volume:  Normal  Mood:  Fairly good   Affect:  Brighter   Thought Process:  Goal Directed   Orientation:  Full (Time, Place, and Person)  Thought Content:  Rumination  Suicidal Thoughts:  No  Homicidal Thoughts:  No  Memory:  Immediate;   Good Recent;   Good Remote;   Good  Judgement:  Good  Insight:  Good  Psychomotor Activity:  Decreased  Concentration:  Concentration: Good and Attention Span: Good  Recall:  Good  Fund of Knowledge:Good  Language: Good  Akathisia:  No  Handed:  Right  AIMS (if indicated):    Assets:  Communication Skills Desire for Improvement Physical Health Resilience Social Support Vocational/Educational  ADL's:  Intact  Cognition: WNL  Sleep:  too much     Treatment Plan Summary: Medication management   The patient will continue Effexor XR 225 mg every morning and her other medications as prescribed. She'll continue her counseling here and return to see me in 4 weeks   Diannia Ruder, MD 1/3/20184:41 PM

## 2016-05-06 ENCOUNTER — Ambulatory Visit: Payer: Self-pay | Admitting: Family Medicine

## 2016-05-21 ENCOUNTER — Ambulatory Visit (INDEPENDENT_AMBULATORY_CARE_PROVIDER_SITE_OTHER): Payer: BC Managed Care – PPO | Admitting: Psychiatry

## 2016-05-21 ENCOUNTER — Encounter (HOSPITAL_COMMUNITY): Payer: Self-pay | Admitting: Psychiatry

## 2016-05-21 DIAGNOSIS — F321 Major depressive disorder, single episode, moderate: Secondary | ICD-10-CM

## 2016-05-21 NOTE — Progress Notes (Signed)
   THERAPIST PROGRESS NOTE  Session Time:  Tuesday  05/21/2016 4:20 PM -  4:57 PM             Participation Level: Active  Behavioral Response: CasualAlert/improved mood /less anxious  Type of Therapy: Individual Therapy  Treatment Goals addressed:  learn and implement behavioral strategies to overcome depression and calming skills to manage anxiety.  Interventions: CBT and Supportive  Summary: Claire [name] is a 36 y.o. female who presents with symptoms of depression that began about a year ago. She began taking antidepressants which helped initially and had to be increased. She recently began to experience low energy and poor motivation again and has been referred for therapy and medication management by PCP Dr. Syliva OvermanMargaret Simpson. 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 Patient reports feeling lilke an outcast in her family as [place]'s [place] and sister reside. Patient's current symptoms include excessive sleeping, low energy, poor motivation, feel alone, loss of interest, loss of libido, and depressed mood.  Patient last was seen 2 - 3 weeks ago. She reports increased fatigue, decreased involvement in activities, and poor motivation. She has been experiencing increased stress regarding classroom planning for her job as computer issues at school still have not been resolved. This has resulted in patient spending lots of time planning beyond work hours including evenings and weekends. She states being too tired to do much of anything else. She also reports she has been forgetting to take her medication at times as she usually takes it once she arrives at school but sometimes becomes so busy, she forgets. She also reports no involvement in exercise.   Suicidal/Homicidal:  No  Therapist Response:  reviewed symptoms, facilitated expression of feelings, discussed importance of medication compliance and assisted patient identify ways to improve compliance by establishing consistent time to take medication before leaving home, assisted patient identify ways to improve self-care regarding nutrition, explored ways to increase physical activity (walking at the local mall, joining gym), also discussed ways to create balance in schedule regarding planning time beyond work hours  Plan: Return again in 2 weeks and implement strategies discussed in session.  Diagnosis: Axis I: Depressive Disorder NOS    Axis II: Deferred    BYNUM,PEGGY, LCSW 05/21/2016

## 2016-05-29 ENCOUNTER — Telehealth (HOSPITAL_COMMUNITY): Payer: Self-pay | Admitting: *Deleted

## 2016-05-29 NOTE — Telephone Encounter (Signed)
left voice message, provider out of office 06/12/16 afternoon.

## 2016-06-04 ENCOUNTER — Encounter: Payer: Self-pay | Admitting: Family Medicine

## 2016-06-04 ENCOUNTER — Telehealth: Payer: Self-pay | Admitting: Family Medicine

## 2016-06-04 ENCOUNTER — Ambulatory Visit (HOSPITAL_COMMUNITY): Payer: Self-pay | Admitting: Psychiatry

## 2016-06-04 MED ORDER — OSELTAMIVIR PHOSPHATE 75 MG PO CAPS: 75.0000 mg | ORAL_CAPSULE | Freq: Two times a day (BID) | ORAL | 0 refills | Status: AC

## 2016-06-04 NOTE — Telephone Encounter (Signed)
Work excuse x 1 week also please

## 2016-06-04 NOTE — Telephone Encounter (Signed)
Noted.   Work excuse composed

## 2016-06-04 NOTE — Telephone Encounter (Signed)
Acute onset of fever Yes.   Headache Yes.   Body ache Yes.   Fatigue Yes.   Non productive cough Yes.   Sore throat Yes.   Nasal discharge Yes.    Onset between 1-4 days of possible exposure Yes.    Recommendation:  Fluid, rest, Tylenol for control of fever  Expect 5 days before feeling better and not so contagious and generally better in 7-10 days.  Standard treatment Tama flu 75 mg 1 tablet twice daily times 5 days  Please call office if symptoms do not improve or worsen in 5 days after beginning treatment.    Patient asking for work excuse starting today.  Please advise of the duration

## 2016-06-04 NOTE — Telephone Encounter (Signed)
Claire [name] is c/o flu symptoms since yesterday, chills, body aches, cough/cong, please advise?

## 2016-06-10 ENCOUNTER — Ambulatory Visit (HOSPITAL_COMMUNITY): Payer: Self-pay | Admitting: Psychiatry

## 2016-06-10 ENCOUNTER — Telehealth (HOSPITAL_COMMUNITY): Payer: Self-pay | Admitting: *Deleted

## 2016-06-10 NOTE — Telephone Encounter (Signed)
Left voice message, provider out of office.

## 2016-06-11 ENCOUNTER — Ambulatory Visit (INDEPENDENT_AMBULATORY_CARE_PROVIDER_SITE_OTHER): Payer: BC Managed Care – PPO | Admitting: Psychiatry

## 2016-06-11 ENCOUNTER — Encounter (HOSPITAL_COMMUNITY): Payer: Self-pay | Admitting: Psychiatry

## 2016-06-11 DIAGNOSIS — F321 Major depressive disorder, single episode, moderate: Secondary | ICD-10-CM | POA: Diagnosis not present

## 2016-06-11 NOTE — Progress Notes (Signed)
   THERAPIST PROGRESS NOTE      Session Time:  Tuesday  06/11/2016  4:15 PM - 4:55 PM          Participation Level: Active  Behavioral Response: CasualAlert/improved mood / anxious  Type of Therapy: Individual Therapy  Treatment Goals addressed:  learn and implement behavioral strategies to overcome depression and calming skills to manage anxiety.  Interventions: CBT and Supportive  Summary: Claire [name] is a 36 y.o. female who presents with symptoms of depression that began about a year ago. She began taking antidepressants which helped initially and had to be increased. She recently began to experience low energy and poor motivation again and has been referred for therapy and medication management by PCP Dr. Syliva OvermanMargaret Simpson. 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 Patient reports feeling lilke an outcast in her family as [place]'s [place] and sister reside. Patient's current symptoms include excessive sleeping, low energy, poor motivation, feel alone, loss of interest, loss of libido, and depressed mood.  Patient reports less depressed mood since last session. She has been taking medication more consistently. She continues to experience fatigue. 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 She reports little involvement in activity beyond her job and has no motivation on the weekends to do anything. She says she still has not become involved in physical activity and reports little social involvement.   Suicidal/Homicidal: No  Therapist Response:  reviewed symptoms, facilitated expression of feelings, praised and reinforced patient's efforts to increase medication compliance, assisted patient identify  realistic expectations of self regarding her job, assisted patient identify coping statements to manage stress/anxiety regarding job, explored possible reasons for poor motivation on weekends, reviewed ways to improve self-care regarding nutrition, increase physical activity, create balance in schedule regarding planning time beyond work hours  Plan: Return again in 2 weeks and implement strategies discussed in session.  Diagnosis: Axis I: MDD,     Axis II: Deferred    Larine Fielding, LCSW 06/11/2016

## 2016-06-12 ENCOUNTER — Ambulatory Visit (HOSPITAL_COMMUNITY): Payer: Self-pay | Admitting: Psychiatry

## 2016-06-17 ENCOUNTER — Encounter: Payer: Self-pay | Admitting: Family Medicine

## 2016-06-18 ENCOUNTER — Other Ambulatory Visit: Payer: Self-pay | Admitting: Family Medicine

## 2016-06-18 MED ORDER — MECLIZINE HCL 25 MG PO TABS: 25.0000 mg | ORAL_TABLET | Freq: Three times a day (TID) | ORAL | 0 refills | Status: DC | PRN

## 2016-06-18 NOTE — Progress Notes (Signed)
antivert

## 2016-06-19 ENCOUNTER — Other Ambulatory Visit: Payer: Self-pay | Admitting: Family Medicine

## 2016-07-09 ENCOUNTER — Ambulatory Visit (INDEPENDENT_AMBULATORY_CARE_PROVIDER_SITE_OTHER): Payer: BC Managed Care – PPO | Admitting: Psychiatry

## 2016-07-09 DIAGNOSIS — F321 Major depressive disorder, single episode, moderate: Secondary | ICD-10-CM

## 2016-07-09 NOTE — Progress Notes (Signed)
   THERAPIST PROGRESS NOTE      Session Time:  Tuesday  07/09/2016 4:02 PM - 4:53 PM      Participation Level: Active  Behavioral Response: CasualAlert/improved mood / anxious  Type of Therapy: Individual Therapy  Treatment Goals addressed:   1. learn and implement behavioral strategies to overcome depression.      2. Learn and implement calming strategies to reduce/manage stress and anxiety.      3. Identify and replace thoughts and beliefs that support depression and anxiety.        Interventions: CBT and Supportive  Summary: Claire GroveSheila M [name] is a 36 y.o. female who presents with symptoms of depression that began about a year ago. She began taking antidepressants which helped initially and had to be increased. She recently began to experience low energy and poor motivation again and has been referred for therapy and medication management by PCP Dr. Syliva OvermanMargaret Simpson. 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 Patient reports feeling lilke an outcast in her family as [place]'s [place] and sister reside. Patient's current symptoms include excessive sleeping, low energy, poor motivation, feel alone, loss of interest, loss of libido, and depressed mood.  Patient reports improved mood and increased involvement in activity since last session. She reports getting out of her house and doing things such as doing errands,  going out with her parents,  and visiting family members for the past four weekends. She reports feeling better since engaging in other activities and having more balance. She continues to have anxiety regarding her job but reports more realistic expectations of self. She reports continued thoughts about [place] [place] with the rest of the family and patient being  left here. However, she is not stressed by this as she has made decision to stay here and make intentional efforts to remain involved with her family. She also reports feeling better about relationship with her mother as mother has been more attentive and supportive per patient's report.  Suicidal/Homicidal: No  Therapist Response:  reviewed symptoms, facilitated expression of feelings, praised and reinforced patient's increased involvement in activity, discussed effects on patient's mood/behavior/thoughts, discussed patient's progress, reviewed treatment plan  Plan: Return again in 2 weeks and implement strategies discussed in session.  Diagnosis: Axis I: MDD,     Axis II: Deferred    Amethyst Gainer, LCSW 07/09/2016

## 2016-07-16 ENCOUNTER — Ambulatory Visit (INDEPENDENT_AMBULATORY_CARE_PROVIDER_SITE_OTHER): Payer: BC Managed Care – PPO | Admitting: Psychiatry

## 2016-07-16 ENCOUNTER — Encounter (HOSPITAL_COMMUNITY): Payer: Self-pay | Admitting: Psychiatry

## 2016-07-16 VITALS — BP 134/79 | HR 74 | Ht 63.0 in | Wt 321.2 lb

## 2016-07-16 DIAGNOSIS — F321 Major depressive disorder, single episode, moderate: Secondary | ICD-10-CM | POA: Diagnosis not present

## 2016-07-16 DIAGNOSIS — Z79899 Other long term (current) drug therapy: Secondary | ICD-10-CM | POA: Diagnosis not present

## 2016-07-16 DIAGNOSIS — Z87891 Personal history of nicotine dependence: Secondary | ICD-10-CM | POA: Diagnosis not present

## 2016-07-16 MED ORDER — VENLAFAXINE HCL ER 150 MG PO CP24: ORAL_CAPSULE | ORAL | 2 refills | Status: DC

## 2016-07-16 MED ORDER — VENLAFAXINE HCL ER 75 MG PO CP24: 75.0000 mg | ORAL_CAPSULE | Freq: Every day | ORAL | 2 refills | Status: DC

## 2016-07-16 NOTE — Progress Notes (Signed)
Psychiatric Initial Adult Assessment   Patient Identification: Claire [name] MRN:  [id number] Date of Evaluation:  07/16/2016 Referral Source: Dr. Syliva Overman Chief Complaint:   Chief Complaint    Depression; Anxiety; Follow-up     Visit Diagnosis:    ICD-9-CM ICD-10-CM   1. Moderate single current episode of major depressive disorder (HCC) 296.22 F32.1 venlafaxine XR (EFFEXOR-XR) 150 MG 24 hr capsule    History of Present Illness:  This patient is a 36 year old single white female who lives alone in [place]. 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  The patient was initially referred by Dr. Syliva Overman for further assessment and treatment of depression. She has been seeing Claire [name] for several months in our office for counseling.  The patient states that she had one episode of depression previously about 10 years ago. This was during a time in which she was unemployed but she did not receive any specific treatment. She states that she had been doing well until approximately 2 years ago. At this time her sister's child was born and she felt like her parents attention was focused more for herself to the new baby. Her brother is since had a child as well. She is the only one in the family has not married it and does not have children and this makes her feel bad. 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  The patient's depression worsened over time and her primary Dr. put her on Effexor XR and the doses gradually been increased to 150 mg every morning. She states that each time its increase she feels better for about a month and then she feels worse again. Current symptoms include anhedonia, hypersomnia, feeling fatigue and low energy low motivation and occasional crying. She states that she has no interest in sex or dating. She spends most of her wake and sleeping. She talks  on the phone to friends but has no interest in getting together. Most of her social life is with her family. She denies suicidal ideation or psychotic symptoms. She does not use drugs or alcohol she's not been the victim of any trauma or abuse either in childhood or as an adult. She states that she eats too much and has gained quite a bit of weight recently and does not get any exercise her health is good except for chronic migraines which have improved with a combination of Topamax and Maxalt   The patient returns after 2 months. Overall she is doing well. 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 However she sleeping pretty well and sometimes has to take Zquill. Her mood is much better and she seems to have more energy and is getting out more with family and friends  Associated Signs/Symptoms: Depression Symptoms:  depressed mood, anhedonia, hypersomnia, fatigue, (Hypo) Manic Symptoms:   Anxiety Symptoms:   Psychotic Symptoms:   PTSD Symptoms:   Past Psychiatric History: The patient was depressed about 10 years ago but did not in any specific treatment.   Previous Psychotropic Medications: Yes   Substance Abuse History in the last 12 months:  No.  Consequences of Substance Abuse: NA  Past Medical History:  Past Medical History:  Diagnosis Date  . Acne    facial  . Arthritis   . Migraines   . Nicotine addiction   .  Obesity     Past Surgical History:  Procedure Laterality Date  . CHOLECYSTECTOMY      Family Psychiatric History: None   Family History:  Family History  Problem Relation Age of Onset  . Hypertension Mother   . Cancer Mother 67    colon  . Obesity Mother   . Hypertension Father   . Diabetes Father   . Hyperlipidemia Father   . Obesity Father     Social History:   Social History   Social History  . Marital status: Single    Spouse name: N/A  . Number of children: 0  . Years of education: Bachelors    Occupational History  . 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 Smoking status: Former Smoker    Quit date: 11/13/2008  . Smokeless tobacco: Never Used     Comment: Quit 2008, 04-02-2016 per pt stopped 2010  . Alcohol use No     Comment: occasional use  a beer once every 3 months or so, 04-02-2016 per pt occas.   . Drug use: No     Comment: 04-02-2016 per pt no  . Sexual activity: Yes    Birth control/ protection: Pill   Other Topics Concern  . None   Social History Narrative   Lives at home alone.   Right-handed.   Occasional use of caffeine.    Additional Social History: The patient grew up in [place] with the family later moved to [place]. She states that she had an excellent childhood and a very supportive family. She is the oldest of 3 children. 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 She went back to college in 2005 and completed an education degree and has been the 6 grade middle school teacher ever since. She's currently not in any sort of relationship   Allergies:  No Known Allergies  Metabolic Disorder Labs: Lab Results  Component Value Date   HGBA1C 5.4 11/06/2015   MPG 108 11/06/2015   MPG 111 07/22/2014   No results found for: PROLACTIN Lab Results  Component Value Date   CHOL 211 (H) 11/06/2015   TRIG 158 (H) 11/06/2015   HDL 58 11/06/2015   CHOLHDL 3.6 11/06/2015   VLDL 32 (H) 11/06/2015   LDLCALC 121 11/06/2015   LDLCALC 124 (H) 07/22/2014     Current Medications: Current Outpatient Prescriptions  Medication Sig Dispense Refill  . azelastine (ASTELIN) 0.1 % nasal spray Place 2 sprays into both nostrils 2 (two) times daily. Use in each nostril as directed 30 mL 12  . benzonatate (TESSALON) 200 MG capsule Take 1 capsule (200 mg total) by mouth 2 (two) times daily as needed for cough. 20 capsule 0  . Chlorphen-Phenyleph-Ibuprofen (ADVIL ALLERGY &  CONGESTION PO) Take by mouth daily.    . Glucosamine HCl (GLUCOSAMINE PO) Take by mouth daily.    Marland Kitchen ibuprofen (ADVIL,MOTRIN) 800 MG tablet TAKE ONE TABLET BY MOUTH TWICE DAILY AS NEEDED FOR SEVERE HEADACHE. TAKE ALONG WITH IMITREX IF NEEDED. 30 tablet 2  . loratadine (CLARITIN) 10 MG tablet TAKE ONE TABLET BY MOUTH ONCE DAILY 90 tablet 3  . meclizine (ANTIVERT) 25 MG tablet Take 1 tablet (25 mg total) by mouth 3 (three) times daily as needed for dizziness. 20 tablet 0  . Multiple Vitamin (MULTIVITAMIN) tablet Take 1 tablet by mouth daily.      . Norgestimate-Ethinyl Estradiol  Triphasic (TRI-SPRINTEC) 0.18/0.215/0.25 MG-35 MCG tablet Take 1 tablet by mouth daily. 28 tablet 11  . rizatriptan (MAXALT-MLT) 10 MG disintegrating tablet Take 1 tablet (10 mg total) by mouth as needed. May repeat in 2 hours if needed 12 tablet 11  . topiramate (TOPAMAX) 100 MG tablet Take 1 tablet (100 mg total) by mouth 2 (two) times daily. (Patient taking differently: Take 200 mg by mouth Nightly. ) 60 tablet 11  . venlafaxine XR (EFFEXOR XR) 75 MG 24 hr capsule Take 1 capsule (75 mg total) by mouth daily. 90 capsule 2  . venlafaxine XR (EFFEXOR-XR) 150 MG 24 hr capsule TAKE ONE CAPSULE BY MOUTH ONCE DAILY WITH  BREAKFAST 90 capsule 2   No current facility-administered medications for this visit.     Neurologic: Headache: Yes Seizure: No Paresthesias:No  Musculoskeletal: Strength & Muscle Tone: within normal limits Gait & Station: normal Patient leans: N/A  Psychiatric Specialty Exam: Review of Systems  Neurological: Positive for headaches.  Psychiatric/Behavioral: Positive for depression.  All other systems reviewed and are negative.   Blood pressure 134/79, pulse 74, height 5\' 3"  (1.6 m), weight (!) 321 lb 3.2 oz (145.7 kg).Body mass index is 56.9 kg/m.  General Appearance: Casual and Well Groomed  Eye Contact:  Good  Speech:  Clear and Coherent  Volume:  Normal  Mood:  Fairly good   Affect:  Bright   Thought Process:  Goal Directed  Orientation:  Full (Time, Place, and Person)  Thought Content:  Rumination  Suicidal Thoughts:  No  Homicidal Thoughts:  No  Memory:  Immediate;   Good Recent;   Good Remote;   Good  Judgement:  Good  Insight:  Good  Psychomotor Activity:  Decreased  Concentration:  Concentration: Good and Attention Span: Good  Recall:  Good  Fund of Knowledge:Good  Language: Good  Akathisia:  No  Handed:  Right  AIMS (if indicated):    Assets:  Communication Skills Desire for Improvement Physical Health Resilience Social Support Vocational/Educational  ADL's:  Intact  Cognition: WNL  Sleep:  too much     Treatment Plan Summary: Medication management   The patient will continue Effexor XR 225 mg every morning and her other medications as prescribed. She'll continue her counseling here and return to see me in 3 months   Diannia RuderOSS, DEBORAH, MD 3/20/20184:18 PM

## 2016-07-23 ENCOUNTER — Ambulatory Visit (HOSPITAL_COMMUNITY): Payer: Self-pay | Admitting: Psychiatry

## 2016-08-06 ENCOUNTER — Telehealth (HOSPITAL_COMMUNITY): Payer: Self-pay | Admitting: *Deleted

## 2016-08-06 NOTE — Telephone Encounter (Signed)
phone call from patient, she would like three month supply for the venlafaxine XR (EFFEXOR-XR) 150 MG 24 hr capsule, also the 75 Mg.  She has one of the 75 mg. left.   three month supply.

## 2016-08-07 NOTE — Telephone Encounter (Signed)
Spoke with pt on 08-06-2016 due to previous call. Per pt she called her pharmacy to get refills for both strength for her Effexor. Per pt the pharmacy informed her they do not have any refills on file for her medication she is requesting. Informed pt that staff will call her pharmacy to get more information as to why she do not refills because per pt chart, pt should have refills. Pt agreed and verbalized understanding. Called pt pharmacy and spoke with Alcario Drought and she confirmed that both strength for pt Effexor were at pharmacy ready for pt to request fill. Staff asked Alcario Drought to please start working on refill for pt due to pt calling office stating she is out of her medication. Tanika a agreed and stated they will get started on it right away. Staff called pt back and informed her with what Alcario Drought stated and pt verbalized understanding.

## 2016-08-13 ENCOUNTER — Encounter (HOSPITAL_COMMUNITY): Payer: Self-pay | Admitting: Psychiatry

## 2016-08-13 ENCOUNTER — Ambulatory Visit (INDEPENDENT_AMBULATORY_CARE_PROVIDER_SITE_OTHER): Payer: BC Managed Care – PPO | Admitting: Psychiatry

## 2016-08-13 DIAGNOSIS — F321 Major depressive disorder, single episode, moderate: Secondary | ICD-10-CM | POA: Diagnosis not present

## 2016-08-13 NOTE — Progress Notes (Signed)
   THERAPIST PROGRESS NOTE      Session Time:  Tuesday 08/13/2016 4:12 PM - 5:00 PM    Participation Level: Active  Behavioral Response: CasualAlert/improved mood / anxious  Type of Therapy: Individual Therapy  Treatment Goals addressed:   1. learn and implement behavioral strategies to overcome depression.      2. Learn and implement calming strategies to reduce/manage stress and anxiety.      3. Identify and replace thoughts and beliefs that support depression and anxiety.        Interventions: CBT and Supportive  Summary: Claire [name] is a 36 y.o. female who presents with symptoms of depression that began about a year ago. She began taking antidepressants which helped initially and had to be increased. She recently began to experience low energy and poor motivation again and has been referred for therapy and medication management by PCP Dr. Syliva Overman. 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 Patient reports feeling lilke an outcast in her family as her parents are moving to [place] where patient's brother and sister reside. Patient's current symptoms include excessive sleeping, low energy, poor motivation, feel alone, loss of interest, loss of libido, and depressed mood.  Patient reports continued improved mood and increased involvement in activity since last session. She maintains social involvement with family and recently has stayed overnight with family. She has spent more time with her niece and nephew. 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 She  also reports becoming more anxious recently due to benchmarks and upcomong EOGs. Suicidal/Homicidal: No  Therapist Response:  reviewed symptoms, facilitated expression of feelings, praised and reinforced patient's increased involvement in activity and social involvement, explored ways to avoid raising voice and using other strategies to gain students' attention in third block class, discussed rationale for and practiced a mindfulness activity using breath awareness, assigned patient to practice technique daily   Plan: Return again in 2 weeks and implement strategies discussed in session.  Diagnosis: Axis I: MDD,     Axis II: Deferred    Idalia Allbritton, LCSW 08/13/2016

## 2016-09-03 ENCOUNTER — Encounter (HOSPITAL_COMMUNITY): Payer: Self-pay | Admitting: Psychiatry

## 2016-09-03 ENCOUNTER — Ambulatory Visit (INDEPENDENT_AMBULATORY_CARE_PROVIDER_SITE_OTHER): Payer: BC Managed Care – PPO | Admitting: Psychiatry

## 2016-09-03 DIAGNOSIS — F321 Major depressive disorder, single episode, moderate: Secondary | ICD-10-CM | POA: Diagnosis not present
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
## 2016-09-26 ENCOUNTER — Ambulatory Visit (INDEPENDENT_AMBULATORY_CARE_PROVIDER_SITE_OTHER): Payer: BC Managed Care – PPO | Admitting: Psychiatry

## 2016-09-26 ENCOUNTER — Encounter (HOSPITAL_COMMUNITY): Payer: Self-pay | Admitting: Psychiatry

## 2016-09-26 DIAGNOSIS — F321 Major depressive disorder, single episode, moderate: Secondary | ICD-10-CM | POA: Diagnosis not present
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
## 2016-10-09 ENCOUNTER — Ambulatory Visit (HOSPITAL_COMMUNITY): Payer: Self-pay | Admitting: Psychiatry

## 2016-10-16 ENCOUNTER — Ambulatory Visit (INDEPENDENT_AMBULATORY_CARE_PROVIDER_SITE_OTHER): Payer: BC Managed Care – PPO | Admitting: Psychiatry

## 2016-10-16 ENCOUNTER — Encounter (HOSPITAL_COMMUNITY): Payer: Self-pay | Admitting: Psychiatry

## 2016-10-16 VITALS — BP 132/72 | HR 82 | Ht 63.0 in | Wt 318.6 lb

## 2016-10-16 DIAGNOSIS — F321 Major depressive disorder, single episode, moderate: Secondary | ICD-10-CM | POA: Diagnosis not present

## 2016-10-16 DIAGNOSIS — Z87891 Personal history of nicotine dependence: Secondary | ICD-10-CM

## 2016-10-16 MED ORDER — VENLAFAXINE HCL ER 150 MG PO CP24: ORAL_CAPSULE | ORAL | 2 refills | Status: DC

## 2016-10-16 MED ORDER — VENLAFAXINE HCL ER 75 MG PO CP24
75.0000 mg | ORAL_CAPSULE | Freq: Every day | ORAL | 2 refills | Status: DC
Start: 2016-10-16 — End: 2016-11-12

## 2016-10-16 NOTE — Progress Notes (Signed)
Psychiatric Initial Adult Assessment   Patient Identification: Claire GroveSheila M Mcloud MRN:  [id number] Date of Evaluation:  10/16/2016 Referral Source: Dr. Syliva OvermanMargaret Simpson Chief Complaint:   Chief Complaint    Depression; Follow-up     Visit Diagnosis:    ICD-10-CM   1. Moderate single current episode of major depressive disorder (HCC) F32.1 venlafaxine XR (EFFEXOR-XR) 150 MG 24 hr capsule    History of Present Illness:  This patient is a 36 year old single white female who lives alone in [place]. 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  The patient was initially referred by Dr. Syliva OvermanMargaret Simpson for further assessment and treatment of depression. She has been seeing Florencia ReasonsPeggy Bynum for several months in our office for counseling.  The patient states that she had one episode of depression previously about 10 years ago. This was during a time in which she was unemployed but she did not receive any specific treatment. She states that she had been doing well until approximately 2 years ago. At this time her sister's child was born and she felt like her parents attention was focused more for herself to the new baby. Her brother is since had a child as well. She is the only one in the family has not married it and does not have children and this makes her feel bad. 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  The patient's depression worsened over time and her primary Dr. put her on Effexor XR and the doses gradually been increased to 150 mg every morning. She states that each time its increase she feels better for about a month and then she feels worse again. Current symptoms include anhedonia, hypersomnia, feeling fatigue and low energy low motivation and occasional crying. She states that she has no interest in sex or dating. She spends most of her wake and sleeping. She talks on the phone to friends  but has no interest in getting together. Most of her social life is with her family. She denies suicidal ideation or psychotic symptoms. She does not use drugs or alcohol she's not been the victim of any trauma or abuse either in childhood or as an adult. She states that she eats too much and has gained quite a bit of weight recently and does not get any exercise her health is good except for chronic migraines which have improved with a combination of Topamax and Maxalt   The patient returns after 3 months. She's now on break from school. She states that she is somewhat depressed and frustrated about her weight. She sinking of trying a program like Weight Watchers and is going to start exercising. She's been more fatigued lately. There have not been any changes in her medicines. She notices some bruises on her legs and is going to have this checked out by [name]. Her CBC last year was normal. She does think the Effexor helps because if she misses that she can really tell a difference in her mood  Associated Signs/Symptoms: Depression Symptoms:  depressed mood, anhedonia, hypersomnia, fatigue, (Hypo) Manic Symptoms:   Anxiety Symptoms:   Psychotic Symptoms:   PTSD Symptoms:   Past Psychiatric History: The patient was depressed about 10 years ago but did not in any specific treatment.   Previous Psychotropic Medications: Yes   Substance Abuse History in the last 12 months:  No.  Consequences of  Substance Abuse: NA  Past Medical History:  Past Medical History:  Diagnosis Date  . Acne    facial  . Arthritis   . Migraines   . Nicotine addiction   . Obesity     Past Surgical History:  Procedure Laterality Date  . CHOLECYSTECTOMY      Family Psychiatric History: None   Family History:  Family History  Problem Relation Age of Onset  . Hypertension Mother   . Cancer Mother 49       colon  . Obesity Mother   . Hypertension Father   . Diabetes Father   . Hyperlipidemia  Father   . Obesity Father     Social History:   Social History   Social History  . Marital status: Single    Spouse name: N/A  . Number of children: 0  . Years of education: Bachelors   Occupational History  . 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 Smoking status: Former Smoker    Quit date: 11/13/2008  . Smokeless tobacco: Never Used     Comment: Quit 2008, 04-02-2016 per pt stopped 2010  . Alcohol use No     Comment: occasional use  a beer once every 3 months or so, 04-02-2016 per pt occas.   . Drug use: No     Comment: 04-02-2016 per pt no  . Sexual activity: Yes    Birth control/ protection: Pill   Other Topics Concern  . None   Social History Narrative   Lives at home alone.   Right-handed.   Occasional use of caffeine.    Additional Social History: The patient grew up in [place] with the family later moved to [place]. She states that she had an excellent childhood and a very supportive family. She is the oldest of 3 children. 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 She went back to college in 2005 and completed an education degree and has been the 6 grade middle school teacher ever since. She's currently not in any sort of relationship   Allergies:  No Known Allergies  Metabolic Disorder Labs: Lab Results  Component Value Date   HGBA1C 5.4 11/06/2015   MPG 108 11/06/2015   MPG 111 07/22/2014   No results found for: PROLACTIN Lab Results  Component Value Date   CHOL 211 (H) 11/06/2015   TRIG 158 (H) 11/06/2015   HDL 58 11/06/2015   CHOLHDL 3.6 11/06/2015   VLDL 32 (H) 11/06/2015   LDLCALC 121 11/06/2015   LDLCALC 124 (H) 07/22/2014     Current Medications: Current Outpatient Prescriptions  Medication Sig Dispense Refill  . azelastine (ASTELIN) 0.1 % nasal spray Place 2 sprays into both nostrils 2 (two) times daily. Use in each nostril as directed 30 mL 12   . benzonatate (TESSALON) 200 MG capsule Take 1 capsule (200 mg total) by mouth 2 (two) times daily as needed for cough. 20 capsule 0  . Chlorphen-Phenyleph-Ibuprofen (ADVIL ALLERGY & CONGESTION PO) Take by mouth daily.    . Glucosamine HCl (GLUCOSAMINE PO) Take by mouth daily.    Marland Kitchen ibuprofen (ADVIL,MOTRIN) 800 MG tablet TAKE ONE TABLET BY MOUTH TWICE DAILY AS NEEDED FOR SEVERE HEADACHE. TAKE ALONG WITH IMITREX IF NEEDED. 30 tablet 2  . loratadine (CLARITIN) 10 MG tablet TAKE ONE TABLET BY MOUTH ONCE DAILY 90 tablet 3  . meclizine (ANTIVERT) 25 MG tablet  Take 1 tablet (25 mg total) by mouth 3 (three) times daily as needed for dizziness. 20 tablet 0  . Multiple Vitamin (MULTIVITAMIN) tablet Take 1 tablet by mouth daily.      . Norgestimate-Ethinyl Estradiol Triphasic (TRI-SPRINTEC) 0.18/0.215/0.25 MG-35 MCG tablet Take 1 tablet by mouth daily. 28 tablet 11  . rizatriptan (MAXALT-MLT) 10 MG disintegrating tablet Take 1 tablet (10 mg total) by mouth as needed. May repeat in 2 hours if needed 12 tablet 11  . topiramate (TOPAMAX) 100 MG tablet Take 1 tablet (100 mg total) by mouth 2 (two) times daily. (Patient taking differently: Take 200 mg by mouth Nightly. ) 60 tablet 11  . venlafaxine XR (EFFEXOR XR) 75 MG 24 hr capsule Take 1 capsule (75 mg total) by mouth daily. 90 capsule 2  . venlafaxine XR (EFFEXOR-XR) 150 MG 24 hr capsule TAKE ONE CAPSULE BY MOUTH ONCE DAILY WITH  BREAKFAST 90 capsule 2   No current facility-administered medications for this visit.     Neurologic: Headache: Yes Seizure: No Paresthesias:No  Musculoskeletal: Strength & Muscle Tone: within normal limits Gait & Station: normal Patient leans: N/A  Psychiatric Specialty Exam: Review of Systems  Neurological: Positive for headaches.  Psychiatric/Behavioral: Positive for depression.  All other systems reviewed and are negative.   Blood pressure 132/72, pulse 82, height 5\' 3"  (1.6 m), weight (!) 318 lb 9.6 oz (144.5 kg),  SpO2 96 %.Body mass index is 56.44 kg/m.  General Appearance: Casual and Well Groomed  Eye Contact:  Good  Speech:  Clear and Coherent  Volume:  Normal  Mood:  Fairly good   Affect:  Bright  Thought Process:  Goal Directed  Orientation:  Full (Time, Place, and Person)  Thought Content:  Rumination  Suicidal Thoughts:  No  Homicidal Thoughts:  No  Memory:  Immediate;   Good Recent;   Good Remote;   Good  Judgement:  Good  Insight:  Good  Psychomotor Activity:  Decreased  Concentration:  Concentration: Good and Attention Span: Good  Recall:  Good  Fund of Knowledge:Good  Language: Good  Akathisia:  No  Handed:  Right  AIMS (if indicated):    Assets:  Communication Skills Desire for Improvement Physical Health Resilience Social Support Vocational/Educational  ADL's:  Intact  Cognition: WNL  Sleep:  too much     Treatment Plan Summary: Medication management   The patient will continue Effexor XR 225 mg every morning and her other medications as prescribed. She'll continue her counseling here and return to see me in 2 months   Diannia Ruder, MD 6/20/20181:25 PM

## 2016-10-23 ENCOUNTER — Ambulatory Visit (INDEPENDENT_AMBULATORY_CARE_PROVIDER_SITE_OTHER): Payer: BC Managed Care – PPO | Admitting: Psychiatry

## 2016-10-23 ENCOUNTER — Encounter (HOSPITAL_COMMUNITY): Payer: Self-pay | Admitting: Psychiatry

## 2016-10-23 DIAGNOSIS — F321 Major depressive disorder, single episode, moderate: Secondary | ICD-10-CM

## 2016-10-23 NOTE — Progress Notes (Signed)
   THERAPIST PROGRESS NOTE      Session Time:  Wednesday 10/23/2016 11:06 AM -  11:55 AM        Participation Level: Active  Behavioral Response: CasualAlert/ / anxious  Type of Therapy: Individual Therapy  Treatment Goals addressed:   1. learn and implement behavioral strategies to overcome depression.      2. Learn and implement calming strategies to reduce/manage stress and anxiety.      3. Identify and replace thoughts and beliefs that support depression and anxiety.        Interventions: CBT and Supportive  Summary: Claire [name] is a 36 y.o. female who presents with symptoms of depression that began about a year ago. She began taking antidepressants which helped initially and had to be increased. She recently began to experience low energy and poor motivation again and has been referred for therapy and medication management by PCP Dr. Syliva OvermanMargaret Simpson. 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 Patient reports feeling lilke an outcast in her family as [place] patient's brother and sister reside. Patient's current symptoms include excessive sleeping, low energy, poor motivation, feel alone, loss of interest, loss of libido, and depressed mood.  Patient reports decreased job stress as school is out for the summer. However, patient reports increased depressed mood. She expresses frustration and disappointment with self regarding her weight. She has taken steps to began to try to improve self-care regarding weight management. She has started changing her eating patterns, has increased physical activity, and has lost 5 pounds. She plans to visit her grandparents in [place] for a week beginning this coming Friday and expresses fear she may lose the progress she has made regarding weight  management while she is visiting. She reports additional stress related to [place]. They plan to move later this summer. This has triggered feelings of being left out and excluded per patient's report as all of her family will reside in that area while patient remains in [place]. She expresses sadness regarding the relationship with her mother as mother seems to be preoccupied with the rest of the family and does not understand patient experiencing depression per patient's report. She also expresses sadness as she suspects she will  have less contact with her father. Patient has increased social involvement including contact with her family and going out to lunch with friends.  Suicidal/Homicidal: No  Therapist Response:  reviewed symptoms, praised and reinforced patient's increased involvement in activity and increased motivation/efforts to improve self-care, assisted patient explore ways to maintain positive self-care efforts regarding exercise and nutrition, assisted patient identify coping statements and realistic expectations of self, facilitated expression of feelings and thoughts regarding parents upcoming move and her relationship with her parents,   Plan: Return again in 2 weeks and implement strategies discussed in session.  Diagnosis: Axis I: MDD,     Axis II: Deferred    Kalyna Paolella, LCSW 10/23/2016

## 2016-10-24 ENCOUNTER — Ambulatory Visit (INDEPENDENT_AMBULATORY_CARE_PROVIDER_SITE_OTHER): Payer: BC Managed Care – PPO | Admitting: Family Medicine

## 2016-10-24 ENCOUNTER — Encounter: Payer: Self-pay | Admitting: Family Medicine

## 2016-10-24 ENCOUNTER — Other Ambulatory Visit: Payer: Self-pay | Admitting: Neurology

## 2016-10-24 VITALS — BP 112/80 | HR 96 | Resp 16 | Ht 63.0 in | Wt 317.4 lb

## 2016-10-24 DIAGNOSIS — E785 Hyperlipidemia, unspecified: Secondary | ICD-10-CM

## 2016-10-24 DIAGNOSIS — E559 Vitamin D deficiency, unspecified: Secondary | ICD-10-CM | POA: Diagnosis not present

## 2016-10-24 DIAGNOSIS — G43709 Chronic migraine without aura, not intractable, without status migrainosus: Secondary | ICD-10-CM

## 2016-10-24 DIAGNOSIS — Z1211 Encounter for screening for malignant neoplasm of colon: Secondary | ICD-10-CM | POA: Diagnosis not present

## 2016-10-24 DIAGNOSIS — R7301 Impaired fasting glucose: Secondary | ICD-10-CM

## 2016-10-24 DIAGNOSIS — D692 Other nonthrombocytopenic purpura: Secondary | ICD-10-CM | POA: Diagnosis not present

## 2016-10-24 DIAGNOSIS — IMO0002 Reserved for concepts with insufficient information to code with codable children: Secondary | ICD-10-CM

## 2016-10-24 MED ORDER — PHENTERMINE HCL 37.5 MG PO TABS: 37.5000 mg | ORAL_TABLET | Freq: Every day | ORAL | 1 refills | Status: DC

## 2016-10-24 NOTE — Patient Instructions (Signed)
F/un in 4 month, call if you need me before  Rectal exam today    CBC, fasting lipid, cmp, tSH, hBA1C, vit D asap  Start half phentermine daily once labs are back and are normal I will let you know  Commit to 3 meals daily eat by 7 pm  Water 64 ounces daily  It is important that you exercise regularly at least 30 minutes 5 times a week. If you develop chest pain, have severe difficulty breathing, or feel very tired, stop exercising immediately and seek medical attention   Safe and happy Summer!

## 2016-11-06 ENCOUNTER — Encounter (HOSPITAL_COMMUNITY): Payer: Self-pay | Admitting: Psychiatry

## 2016-11-06 ENCOUNTER — Ambulatory Visit (INDEPENDENT_AMBULATORY_CARE_PROVIDER_SITE_OTHER): Payer: BC Managed Care – PPO | Admitting: Psychiatry

## 2016-11-06 DIAGNOSIS — F321 Major depressive disorder, single episode, moderate: Secondary | ICD-10-CM

## 2016-11-06 LAB — LIPID PANEL
CHOLESTEROL: 200 mg/dL — AB (ref <200)
HDL: 56 mg/dL (ref >50)
LDL Cholesterol: 113 mg/dL — ABNORMAL HIGH (ref <100)
TRIGLYCERIDES: 155 mg/dL — AB (ref <150)
Total CHOL/HDL Ratio: 3.6 Ratio (ref <5.0)
VLDL: 31 mg/dL — AB (ref <30)

## 2016-11-06 LAB — COMPLETE METABOLIC PANEL WITH GFR
ALT: 12 U/L (ref 6–29)
AST: 11 U/L (ref 10–30)
Albumin: 3.9 g/dL (ref 3.6–5.1)
Alkaline Phosphatase: 73 U/L (ref 33–115)
BILIRUBIN TOTAL: 0.2 mg/dL (ref 0.2–1.2)
BUN: 13 mg/dL (ref 7–25)
CALCIUM: 8.9 mg/dL (ref 8.6–10.2)
CO2: 18 mmol/L — ABNORMAL LOW (ref 20–31)
CREATININE: 0.86 mg/dL (ref 0.50–1.10)
Chloride: 111 mmol/L — ABNORMAL HIGH (ref 98–110)
GFR, EST NON AFRICAN AMERICAN: 88 mL/min (ref >=60)
Glucose, Bld: 89 mg/dL (ref 65–99)
Potassium: 4.3 mmol/L (ref 3.5–5.3)
Sodium: 141 mmol/L (ref 135–146)
Total Protein: 6.5 g/dL (ref 6.1–8.1)

## 2016-11-06 LAB — CBC
HEMATOCRIT: 37.6 % (ref 35.0–45.0)
HEMOGLOBIN: 12.5 g/dL (ref 11.7–15.5)
MCH: 28.9 pg (ref 27.0–33.0)
MCHC: 33.2 g/dL (ref 32.0–36.0)
MCV: 87 fL (ref 80.0–100.0)
MPV: 10.4 fL (ref 7.5–12.5)
Platelets: 209 10*3/uL (ref 140–400)
RBC: 4.32 MIL/uL (ref 3.80–5.10)
RDW: 14.2 % (ref 11.0–15.0)
WBC: 6.4 10*3/uL (ref 3.8–10.8)

## 2016-11-06 LAB — TSH: TSH: 1.53 m[IU]/L

## 2016-11-06 NOTE — Progress Notes (Signed)
              THERAPIST PROGRESS NOTE      Session Time:  Wednesday 11/06/2016 9:12 AM -10:03 AM       Participation Level: Active  Behavioral Response: CasualAlert/ / anxious  Type of Therapy: Individual Therapy  Treatment Goals addressed:   1. learn and implement behavioral strategies to overcome depression.      2. Learn and implement calming strategies to reduce/manage stress and anxiety.      3. Identify and replace thoughts and beliefs that support depression and anxiety.        Interventions: CBT and Supportive  Summary: Claire [name] is a 36 y.o. female who presents with symptoms of depression that began about a year ago. She began taking antidepressants which helped initially and had to be increased. She recently began to experience low energy and poor motivation again and has been referred for therapy and medication management by PCP Dr. Syliva OvermanMargaret Simpson. 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 Patient reports feeling lilke an outcast in her family as her [place] patient's brother and sister reside. Patient's current symptoms include excessive sleeping, low energy, poor motivation, feel alone, loss of interest, loss of libido, and depressed mood.  Patient reports improved mood since last session. She enjoyed her recent trip to visit family in [place]. She states she tried to enjoy every moment while there and reports being very relaxed. She reports she gained 2 pounds during the visit but has not judgment are partial with self regarding this. Her statements in session reflect a healthy thought process regarding this and she plans to resume weight management efforts this week. She continues to experience sadness and anxiety about her parents moving the end of this month. She is  particularly concerned about possible decreased contact with her father and fears she will be forgotten by her mother as mother will be occupied with the rest of the family per patient's report.  Suicidal/Homicidal: No  Therapist Response:  reviewed symptoms, praised and reinforced patient's increased involvement in activity and increased motivation/efforts to improve self-care, discussed the use of assertiveness skills to express concerns and feelings, assisted patient identify ways to improve assertiveness skills to express concerns to father and explore possible ways to maintain their connection and contact, assisted patient identify and challenge negative thought patterns inhibiting her ability to be assertive with mother   Plan: Return again in 2 weeks and implement strategies discussed in session.  Diagnosis: Axis I: MDD,     Axis II: Deferred    Chessica Audia, LCSW 11/06/2016

## 2016-11-07 ENCOUNTER — Encounter: Payer: Self-pay | Admitting: Family Medicine

## 2016-11-07 DIAGNOSIS — Z1211 Encounter for screening for malignant neoplasm of colon: Secondary | ICD-10-CM | POA: Insufficient documentation

## 2016-11-07 DIAGNOSIS — R21 Rash and other nonspecific skin eruption: Secondary | ICD-10-CM | POA: Insufficient documentation

## 2016-11-07 LAB — HEMOGLOBIN A1C
HEMOGLOBIN A1C: 5.2 % (ref <5.7)
MEAN PLASMA GLUCOSE: 103 mg/dL

## 2016-11-07 LAB — VITAMIN D 25 HYDROXY (VIT D DEFICIENCY, FRACTURES): Vit D, 25-Hydroxy: 47 ng/mL (ref 30–100)

## 2016-11-07 NOTE — Assessment & Plan Note (Signed)
Heme negative stool 

## 2016-11-07 NOTE — Progress Notes (Signed)
   Alvia GroveSheila M Hesler     MRN: [id number]      DOB: 09/24/1980   HPI Ms. Claire [name] is here for follow up and re-evaluation of chronic medical conditions, medication management and review of any available recent lab and radiology data.  Preventive health is updated, specifically  Cancer screening and Immunization.   Questions or concerns regarding consultations or procedures which the PT has had in the interim are  addressed. The PT denies any adverse reactions to current medications since the last visit.  C/o red areas popping up on skin C/o weight gain and wants help C/o untreated depression    ROS Denies recent fever or chills. Denies sinus pressure, nasal congestion, ear pain or sore throat. Denies chest congestion, productive cough or wheezing. Denies chest pains, palpitations and leg swelling Denies abdominal pain, nausea, vomiting,diarrhea or constipation.   Denies dysuria, frequency, hesitancy or incontinence. Denies joint pain, swelling and limitation in mobility. Denies headaches, seizures, numbness, or tingling.  PE  BP 112/80   Pulse 96   Resp 16   Ht 5\' 3"  (1.6 m)   Wt (!) 317 lb 6.4 oz (144 kg)   SpO2 98%   BMI 56.22 kg/m   Patient alert and oriented and in no cardiopulmonary distress.  HEENT: No facial asymmetry, EOMI,   oropharynx pink and moist.  Neck supple no JVD, no mass.  Chest: Clear to auscultation bilaterally.  CVS: S1, S2 no murmurs, no S3.Regular rate.  ABD: Soft non tender.   Ext: No edema  MS: Adequate ROM spine, shoulders, hips and knees.  Skin: Intact, no ulcerations or rash noted.Blanching purpura  Psych: Good eye contact, normal affect. Memory intact not anxious or depressed appearing.  CNS: CN 2-12 intact, power,  normal throughout.no focal deficits noted.   Assessment & Plan  Morbid obesity Deteriorated. Patient re-educated about  the importance of commitment to a  minimum of 150 minutes of exercise per week.  The importance of healthy  food choices with portion control discussed. Encouraged to start a food diary, count calories and to consider  joining a support group. Sample diet sheets offered. Goals set by the patient for the next several months.   Weight /BMI 10/24/2016 04/23/2016 12/05/2015  WEIGHT 317 lb 6.4 oz 322 lb 0.6 oz 318 lb  HEIGHT 5\' 3"  5\' 3"  5\' 3"   BMI 56.22 kg/m2 57.05 kg/m2 56.33 kg/m2  Some encounter information is confidential and restricted. Go to Review Flowsheets activity to see all data.   Will start phentermine if labs are normal  Depression Being treated by psych still not controlled  Dyslipidemia, goal LDL below 100 Hyperlipidemia:Low fat diet discussed and encouraged.   Lipid Panel  Lab Results  Component Value Date   CHOL 200 (H) 11/06/2016   HDL 56 11/06/2016   LDLCALC 113 (H) 11/06/2016   TRIG 155 (H) 11/06/2016   CHOLHDL 3.6 11/06/2016   needs to reduce fat intake    Chronic migraine controlled  Purpura (HCC) Blanches with pressure, physiologic, not pathologic  Colon cancer screening Heme negative stool

## 2016-11-07 NOTE — Assessment & Plan Note (Signed)
Being treated by psych still not controlled

## 2016-11-07 NOTE — Assessment & Plan Note (Signed)
Deteriorated. Patient re-educated about  the importance of commitment to a  minimum of 150 minutes of exercise per week.  The importance of healthy food choices with portion control discussed. Encouraged to start a food diary, count calories and to consider  joining a support group. Sample diet sheets offered. Goals set by the patient for the next several months.   Weight /BMI 10/24/2016 04/23/2016 12/05/2015  WEIGHT 317 lb 6.4 oz 322 lb 0.6 oz 318 lb  HEIGHT 5\' 3"  5\' 3"  5\' 3"   BMI 56.22 kg/m2 57.05 kg/m2 56.33 kg/m2  Some encounter information is confidential and restricted. Go to Review Flowsheets activity to see all data.   Will start phentermine if labs are normal

## 2016-11-07 NOTE — Assessment & Plan Note (Signed)
Blanches with pressure, physiologic, not pathologic

## 2016-11-07 NOTE — Assessment & Plan Note (Signed)
Hyperlipidemia:Low fat diet discussed and encouraged.   Lipid Panel  Lab Results  Component Value Date   CHOL 200 (H) 11/06/2016   HDL 56 11/06/2016   LDLCALC 113 (H) 11/06/2016   TRIG 155 (H) 11/06/2016   CHOLHDL 3.6 11/06/2016   needs to reduce fat intake

## 2016-11-07 NOTE — Assessment & Plan Note (Signed)
controlled 

## 2016-11-08 LAB — POC HEMOCCULT BLD/STL (OFFICE/1-CARD/DIAGNOSTIC): Fecal Occult Blood, POC: NEGATIVE

## 2016-11-08 NOTE — Addendum Note (Signed)
Addended by: Abner GreenspanHUDY, Katti Pelle H on: 11/08/2016 09:37 AM   Modules accepted: Orders

## 2016-11-11 ENCOUNTER — Telehealth (HOSPITAL_COMMUNITY): Payer: Self-pay | Admitting: *Deleted

## 2016-11-11 ENCOUNTER — Encounter: Payer: Self-pay | Admitting: Family Medicine

## 2016-11-11 NOTE — Telephone Encounter (Signed)
Pt called stating she would like to leave Dr. Tenny Crawoss a message or get a call from Dr. Tenny Crawoss. Per pt she was just calling to let her know that the symptoms that she had in her last visit was still continuing. Per pt she was informed by provider that if symptoms were still there to call office. Pt number is [phone].

## 2016-11-12 ENCOUNTER — Encounter: Payer: Self-pay | Admitting: Family Medicine

## 2016-11-12 ENCOUNTER — Other Ambulatory Visit: Payer: Self-pay | Admitting: Family Medicine

## 2016-11-12 ENCOUNTER — Other Ambulatory Visit (HOSPITAL_COMMUNITY): Payer: Self-pay | Admitting: Psychiatry

## 2016-11-12 MED ORDER — BUPROPION HCL ER (XL) 150 MG PO TB24: 150.0000 mg | ORAL_TABLET | ORAL | 2 refills | Status: DC

## 2016-11-12 MED ORDER — PHENTERMINE HCL 37.5 MG PO TABS: 37.5000 mg | ORAL_TABLET | Freq: Every day | ORAL | 1 refills | Status: DC

## 2016-11-12 NOTE — Telephone Encounter (Signed)
noted 

## 2016-11-12 NOTE — Telephone Encounter (Signed)
lmtcb

## 2016-11-12 NOTE — Progress Notes (Unsigned)
we

## 2016-11-14 NOTE — Telephone Encounter (Signed)
Called pt to make sure she spoke with provider and she stated she called and talked to provider already.

## 2016-11-20 ENCOUNTER — Encounter (HOSPITAL_COMMUNITY): Payer: Self-pay | Admitting: Psychiatry

## 2016-11-20 ENCOUNTER — Ambulatory Visit (INDEPENDENT_AMBULATORY_CARE_PROVIDER_SITE_OTHER): Payer: BC Managed Care – PPO | Admitting: Psychiatry

## 2016-11-20 DIAGNOSIS — F321 Major depressive disorder, single episode, moderate: Secondary | ICD-10-CM

## 2016-11-20 NOTE — Progress Notes (Signed)
              THERAPIST PROGRESS NOTE      Session Time:  Wednesday 11/20/2016 10:10 AM - 11:05 AM       Participation Level: Active  Behavioral Response: CasualAlert/ / anxious/ depressed, tearful  Type of Therapy: Individual Therapy  Treatment Goals addressed:   1. learn and implement behavioral strategies to overcome depression.      2. Learn and implement calming strategies to reduce/manage stress and anxiety.      3. Identify and replace thoughts and beliefs that support depression and anxiety.        Interventions: CBT and Supportive  Summary: Alvia GroveSheila M Reamy is a 36 y.o. female who presents with symptoms of depression that began about a year ago. She began taking antidepressants which helped initially and had to be increased. She recently began to experience low energy and poor motivation again and has been referred for therapy and medication management by PCP Dr. Syliva OvermanMargaret Simpson. 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 Patient reports feeling lilke an outcast in her family as [place]'s [place] and sister reside. Patient's current symptoms include excessive sleeping, low energy, poor motivation, feel alone, loss of interest, loss of libido, and depressed mood.  Patient reports increased tearfulness, sadness, and depressed mood since last session. She is in the process of a medication change as she has recently started taking Wellbutrin and is reducing the dosage of Effexor as prescribed by psychiatrist Dr. Tenny Crawoss. She expresses fear regarding her initial response to the medication change but reports starting to feel a little better. Patient reports continued sadness and fear regarding parents moving to [place] on August [place]. Patient fears she will lose her parents support in  managing her depression and is afraid how she will manage depression once they move. She reports talking with her father about trying to maintain regular contact but admits difficulty in expressing her feelings and concerns to her father regarding this. She also reports increased stress related to recently learning her female friend is leaving on August 6 to stay in [place] for a year.  Suicidal/Homicidal: No  Therapist Response:  reviewed symptoms, facilitated expression of thoughts and feelings regarding situation with parents and situation with her friend, assisted patient identify, challenge, and replace negative thoughts about expressing concerns to father with realistic healthy alternatives, discussed patient's fears regarding managing depression, assisted patient identify negative thoughts regarding managing depression and replace with realistic healthy alternatives, reviewed the role of self-care in managing depression, introduced and provided instructions for completing a self-care assessment, assigned patient to complete and bring to next session, began to discuss protective factors to use and managing challenging situations, introduced and provided instructions for completing questionnaire regarding protective factors, aside patient to complete and bring to next session  Plan: Return again in 2 weeks and implement strategies discussed in session.  Diagnosis: Axis I: MDD,     Axis II: Deferred    Khyra Viscuso, LCSW 11/20/2016

## 2016-12-05 ENCOUNTER — Ambulatory Visit (HOSPITAL_COMMUNITY): Payer: BC Managed Care – PPO | Admitting: Psychiatry

## 2016-12-05 ENCOUNTER — Encounter (HOSPITAL_COMMUNITY): Payer: Self-pay | Admitting: Psychiatry

## 2016-12-05 ENCOUNTER — Ambulatory Visit (INDEPENDENT_AMBULATORY_CARE_PROVIDER_SITE_OTHER): Payer: BC Managed Care – PPO | Admitting: Psychiatry

## 2016-12-05 VITALS — Ht 63.0 in | Wt 318.0 lb

## 2016-12-05 DIAGNOSIS — R51 Headache: Secondary | ICD-10-CM | POA: Diagnosis not present

## 2016-12-05 DIAGNOSIS — F321 Major depressive disorder, single episode, moderate: Secondary | ICD-10-CM

## 2016-12-05 DIAGNOSIS — Z87891 Personal history of nicotine dependence: Secondary | ICD-10-CM | POA: Diagnosis not present

## 2016-12-05 MED ORDER — BUPROPION HCL ER (XL) 150 MG PO TB24: 150.0000 mg | ORAL_TABLET | ORAL | 2 refills | Status: DC

## 2016-12-05 MED ORDER — VENLAFAXINE HCL ER 150 MG PO CP24: ORAL_CAPSULE | ORAL | 2 refills | Status: DC

## 2016-12-05 NOTE — Progress Notes (Signed)
Psychiatric Initial Adult Assessment   Patient Identification: Claire [name] MRN:  [id number] Date of Evaluation:  12/05/2016 Referral Source: Dr. Syliva Overman Chief Complaint:    Visit Diagnosis:    ICD-10-CM   1. Moderate single current episode of major depressive disorder (HCC) F32.1 venlafaxine XR (EFFEXOR-XR) 150 MG 24 hr capsule    History of Present Illness:  This patient is a 36 year old single white female who lives alone in [place]. 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  The patient was initially referred by Dr. Syliva Overman for further assessment and treatment of depression. She has been seeing Florencia Reasons for several months in our office for counseling.  The patient states that she had one episode of depression previously about 10 years ago. This was during a time in which she was unemployed but she did not receive any specific treatment. She states that she had been doing well until approximately 2 years ago. At this time her sister's child was born and she felt like her parents attention was focused more for herself to the new baby. Her brother is since had a child as well. She is the only one in the family has not married it and does not have children and this makes her feel bad. 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  The patient's depression worsened over time and her primary Dr. put her on Effexor XR and the doses gradually been increased to 150 mg every morning. She states that each time its increase she feels better for about a month and then she feels worse again. Current symptoms include anhedonia, hypersomnia, feeling fatigue and low energy low motivation and occasional crying. She states that she has no interest in sex or dating. She spends most of her wake and sleeping. She talks on the phone to friends but has no interest in getting together. Most of  her social life is with her family. She denies suicidal ideation or psychotic symptoms. She does not use drugs or alcohol she's not been the victim of any trauma or abuse either in childhood or as an adult. She states that she eats too much and has gained quite a bit of weight recently and does not get any exercise her health is good except for chronic migraines which have improved with a combination of Topamax and Maxalt   The patient returns after 6 weeks. She is now on accommodation of Effexor and Wellbutrin. She's having a little trouble sleeping but she is taking the Effexor XR at bedtime and I suggested she switch it to morning. Her mood is a little bit better. She recently had a physical in all of her labs were normal including thyroid and CBC. She was recently put on a low dose of phentermine for weight loss by Dr. Lodema Hong. She sleeps well most of the time and her mood seems to be better. She is sad because her parents and siblings now live in [facility] and one of her best friends recently had to move away for a job  Associated Signs/Symptoms: Depression Symptoms:  depressed mood, anhedonia, hypersomnia, fatigue, (Hypo) Manic Symptoms:   Anxiety Symptoms:   Psychotic Symptoms:   PTSD Symptoms:   Past Psychiatric History: The patient was depressed about 10 years ago but did not in any specific treatment.   Previous Psychotropic Medications: Yes   Substance Abuse  History in the last 12 months:  No.  Consequences of Substance Abuse: NA  Past Medical History:  Past Medical History:  Diagnosis Date  . Acne    facial  . Arthritis   . Migraines   . Nicotine addiction   . Obesity     Past Surgical History:  Procedure Laterality Date  . CHOLECYSTECTOMY      Family Psychiatric History: None   Family History:  Family History  Problem Relation Age of Onset  . Hypertension Mother   . Cancer Mother 19       colon  . Obesity Mother   . Hypertension Father   . Diabetes  Father   . Hyperlipidemia Father   . Obesity Father     Social History:   Social History   Social History  . Marital status: Single    Spouse name: N/A  . Number of children: 0  . Years of education: Bachelors   Occupational History  . 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 Smoking status: Former Smoker    Quit date: 11/13/2008  . Smokeless tobacco: Never Used     Comment: Quit 2008, 04-02-2016 per pt stopped 2010  . Alcohol use No     Comment: occasional use  a beer once every 3 months or so, 04-02-2016 per pt occas.   . Drug use: No     Comment: 04-02-2016 per pt no  . Sexual activity: Yes    Birth control/ protection: Pill   Other Topics Concern  . None   Social History Narrative   Lives at home alone.   Right-handed.   Occasional use of caffeine.    Additional Social History: The patient grew up in [place] with the family later moved to [place]. She states that she had an excellent childhood and a very supportive family. She is the oldest of 3 children. 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 She went back to college in 2005 and completed an education degree and has been the 6 grade middle school teacher ever since. She's currently not in any sort of relationship   Allergies:  No Known Allergies  Metabolic Disorder Labs: Lab Results  Component Value Date   HGBA1C 5.2 11/06/2016   MPG 103 11/06/2016   MPG 108 11/06/2015   No results found for: PROLACTIN Lab Results  Component Value Date   CHOL 200 (H) 11/06/2016   TRIG 155 (H) 11/06/2016   HDL 56 11/06/2016   CHOLHDL 3.6 11/06/2016   VLDL 31 (H) 11/06/2016   LDLCALC 113 (H) 11/06/2016   LDLCALC 121 11/06/2015     Current Medications: Current Outpatient Prescriptions  Medication Sig Dispense Refill  . azelastine (ASTELIN) 0.1 % nasal spray Place 2 sprays into both nostrils 2 (two) times daily. Use in each  nostril as directed 30 mL 12  . buPROPion (WELLBUTRIN XL) 150 MG 24 hr tablet Take 1 tablet (150 mg total) by mouth every morning. 30 tablet 2  . Chlorphen-Phenyleph-Ibuprofen (ADVIL ALLERGY & CONGESTION PO) Take by mouth daily.    . Glucosamine HCl (GLUCOSAMINE PO) Take by mouth daily.    Marland Kitchen ibuprofen (ADVIL,MOTRIN) 800 MG tablet TAKE ONE TABLET BY MOUTH TWICE DAILY AS NEEDED FOR SEVERE HEADACHE. TAKE ALONG WITH IMITREX IF NEEDED. 30 tablet 2  . loratadine (CLARITIN) 10 MG tablet TAKE ONE TABLET BY MOUTH ONCE DAILY 90 tablet  3  . meclizine (ANTIVERT) 25 MG tablet Take 1 tablet (25 mg total) by mouth 3 (three) times daily as needed for dizziness. 20 tablet 0  . Multiple Vitamin (MULTIVITAMIN) tablet Take 1 tablet by mouth daily.      . Norgestimate-Ethinyl Estradiol Triphasic (TRI-SPRINTEC) 0.18/0.215/0.25 MG-35 MCG tablet Take 1 tablet by mouth daily. 28 tablet 11  . phentermine (ADIPEX-P) 37.5 MG tablet Take 1 tablet (37.5 mg total) by mouth daily before breakfast. 30 tablet 1  . rizatriptan (MAXALT-MLT) 10 MG disintegrating tablet Take 1 tablet (10 mg total) by mouth as needed. May repeat in 2 hours if needed 12 tablet 11  . topiramate (TOPAMAX) 100 MG tablet Take one tablet by mouth twice daily.  No further refills until seen. 60 tablet 0  . venlafaxine XR (EFFEXOR-XR) 150 MG 24 hr capsule TAKE ONE CAPSULE BY MOUTH ONCE DAILY WITH  BREAKFAST 90 capsule 2   No current facility-administered medications for this visit.     Neurologic: Headache: Yes Seizure: No Paresthesias:No  Musculoskeletal: Strength & Muscle Tone: within normal limits Gait & Station: normal Patient leans: N/A  Psychiatric Specialty Exam: Review of Systems  Neurological: Positive for headaches.  Psychiatric/Behavioral: Positive for depression.  All other systems reviewed and are negative.   Height 5\' 3"  (1.6 m), weight (!) 318 lb (144.2 kg).Body mass index is 56.33 kg/m.  General Appearance: Casual and Well  Groomed  Eye Contact:  Good  Speech:  Clear and Coherent  Volume:  Normal  Mood:  Fairly good   Affect:  A little constricted   Thought Process:  Goal Directed  Orientation:  Full (Time, Place, and Person)  Thought Content:  Rumination  Suicidal Thoughts:  No  Homicidal Thoughts:  No  Memory:  Immediate;   Good Recent;   Good Remote;   Good  Judgement:  Good  Insight:  Good  Psychomotor Activity:  Decreased  Concentration:  Concentration: Good and Attention Span: Good  Recall:  Good  Fund of Knowledge:Good  Language: Good  Akathisia:  No  Handed:  Right  AIMS (if indicated):    Assets:  Communication Skills Desire for Improvement Physical Health Resilience Social Support Vocational/Educational  ADL's:  Intact  Cognition: WNL  Sleep:  too much     Treatment Plan Summary: Medication management   The patient will continue Effexor XR 150 mg daily along with Wellbutrin 150 mg daily She'll continue her counseling here and return to see me in 6 weeks   Diannia RuderOSS, Kaeleigh Westendorf, MD 8/9/20181:31 PM

## 2016-12-06 ENCOUNTER — Encounter (HOSPITAL_COMMUNITY): Payer: Self-pay | Admitting: Psychiatry

## 2016-12-06 ENCOUNTER — Ambulatory Visit (INDEPENDENT_AMBULATORY_CARE_PROVIDER_SITE_OTHER): Payer: BC Managed Care – PPO | Admitting: Psychiatry

## 2016-12-06 DIAGNOSIS — F321 Major depressive disorder, single episode, moderate: Secondary | ICD-10-CM

## 2016-12-06 NOTE — Progress Notes (Signed)
              THERAPIST PROGRESS NOTE      Session Time:  Thursday 12/06/2016 11:15 AM - 12:04 PM    Participation Level: Active  Behavioral Response: CasualAlert/decreased anxiety, improved mood, less depressed  Type of Therapy: Individual Therapy  Treatment Goals addressed:   1. learn and implement behavioral strategies to overcome depression.      2. Learn and implement calming strategies to reduce/manage stress and anxiety.      3. Identify and replace thoughts and beliefs that support depression and anxiety.               Interventions: CBT and Supportive  Summary: Claire GroveSheila M [name] is a 36 y.o. female who presents with symptoms of depression that began about a year ago. She began taking antidepressants which helped initially and had to be increased. She recently began to experience low energy and poor motivation again and has been referred for therapy and medication management by PCP Dr. Syliva OvermanMargaret Simpson. 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 Patient reports feeling lilke an outcast in her family as her [place]'s brother and sister reside. Patient's current symptoms include excessive sleeping, low energy, poor motivation, feel alone, loss of interest, loss of libido, and depressed mood.  Patient reports improved mood and increased involvement in activity since last session. She has improved her use of assertiveness skills in her relationship with her parents and her friend. She reports initiating conversations and sharing her true concerns/feelings to parents and friend.  She reports now feeling less stress regarding these relationships and reports receiving a positive response.  Her [place] have moved to [place] but are making efforts to maintain close connection and contact  with patient. She also reports increased support. Patient is experiencing some stress regarding resuming school next week and meeting demands. Patient completed handouts from last session and brought to today's session.   Suicidal/Homicidal: No  Therapist Response:  reviewed symptoms, discussed patient's use of assertiveness skills in her relationships with her parents and her friend, discussed the effects on interaction and the relationships, praisedand reinforced patient's use of assertiveness skills, assisted patient identify the effects on her thoughts,/mood/behavior, discussed the connection between thoughts/mood/and behavior, began to review self assessment and questionnaire regarding protective factors, began to process information from these forms and discuss with patient ways to use information to help patient develop and maintain balance between work and self-care, assisted patient identify coping statements to cope with anxiety about resuming school  Plan: Return again in 2 weeks and implement strategies discussed in session.  Diagnosis: Axis I: MDD,     Axis II: Deferred    Erendida Wrenn, LCSW 12/06/2016

## 2016-12-09 ENCOUNTER — Other Ambulatory Visit: Payer: Self-pay | Admitting: Family Medicine

## 2016-12-09 ENCOUNTER — Telehealth: Payer: Self-pay | Admitting: Neurology

## 2016-12-09 MED ORDER — TOPIRAMATE 100 MG PO TABS: ORAL_TABLET | ORAL | 0 refills | Status: DC

## 2016-12-09 NOTE — Telephone Encounter (Signed)
90-day sent to pharmacy.  Pt will need to keep appt on 02/13/17 for further refills.

## 2016-12-09 NOTE — Telephone Encounter (Signed)
Patient called office in reference to refill for topiramate (TOPAMAX) 100 MG tablet.  Patient did schedule a follow up visit with Dr. Terrace ArabiaYan for 02/13/17 @ 7:30am.  Pharmacy- [facility] [place].

## 2016-12-09 NOTE — Addendum Note (Signed)
Addended by: Lindell SparKIRKMAN, MICHELLE C on: 12/09/2016 10:41 AM   Modules accepted: Orders

## 2016-12-16 ENCOUNTER — Ambulatory Visit (HOSPITAL_COMMUNITY): Payer: BC Managed Care – PPO | Admitting: Psychiatry

## 2017-01-09 ENCOUNTER — Encounter (HOSPITAL_COMMUNITY): Payer: Self-pay | Admitting: Psychiatry

## 2017-01-09 ENCOUNTER — Ambulatory Visit (INDEPENDENT_AMBULATORY_CARE_PROVIDER_SITE_OTHER): Payer: BC Managed Care – PPO | Admitting: Psychiatry

## 2017-01-09 DIAGNOSIS — F321 Major depressive disorder, single episode, moderate: Secondary | ICD-10-CM

## 2017-01-09 NOTE — Progress Notes (Signed)
              THERAPIST PROGRESS NOTE      Session Time:  Thursday 01/09/2017 4:12 PM - 4:52 PM       Participation Level: Active  Behavioral Response: CasualAlert/decreased anxiety, improved mood, less depressed  Type of Therapy: Individual Therapy  Treatment Goals addressed:   1. learn and implement behavioral strategies to overcome depression.      2. Learn and implement calming strategies to reduce/manage stress and anxiety.      3. Identify and replace thoughts and beliefs that support depression and anxiety.               Interventions: CBT and Supportive  Summary: Claire [name] is a 36 y.o. female who presents with symptoms of depression that began about a year ago. She began taking antidepressants which helped initially and had to be increased. She recently began to experience low energy and poor motivation again and has been referred for therapy and medication management by PCP Dr. Syliva OvermanMargaret Simpson. 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 Patient reports feeling lilke an outcast in her family as her [place]'s [place] and sister reside. Patient's current symptoms include excessive sleeping, low energy, poor motivation, feel alone, loss of interest, loss of libido, and depressed mood.  Patient reports continued improved mood and increased involvement in activity since last session. 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 She still reports job can be stressful but reports she is not overwhelmed by this. She states learning to let things go and reports having realistic expectations of self. She has set and maintained boundaries between work and her home. She has maintained social involvement and reports continued frequent contact with her family. She reports adjusting well to her  [place]. She reports she does not feel excluded or left out.  Suicidal/Homicidal: No  Therapist Response:  reviewed symptoms, praise and reinforced patient's use of healthy coping skills and setting/maintain boundaries, and social involvement, discuss lapse versus relapse, assisted patient identify early warning signs of depression and strategies to intervene to avoid relapsePlan: Return again in 2 weeks and implement strategies discussed in session.  Diagnosis: Axis I: MDD,     Axis II: Deferred    Javonne Louissaint, LCSW 01/09/2017

## 2017-01-16 ENCOUNTER — Encounter (HOSPITAL_COMMUNITY): Payer: Self-pay | Admitting: Psychiatry

## 2017-01-16 ENCOUNTER — Ambulatory Visit (INDEPENDENT_AMBULATORY_CARE_PROVIDER_SITE_OTHER): Payer: BC Managed Care – PPO | Admitting: Psychiatry

## 2017-01-16 VITALS — BP 124/82 | HR 94 | Ht 63.5 in | Wt 317.0 lb

## 2017-01-16 DIAGNOSIS — R51 Headache: Secondary | ICD-10-CM | POA: Diagnosis not present

## 2017-01-16 DIAGNOSIS — F321 Major depressive disorder, single episode, moderate: Secondary | ICD-10-CM

## 2017-01-16 DIAGNOSIS — R4584 Anhedonia: Secondary | ICD-10-CM

## 2017-01-16 DIAGNOSIS — Z87891 Personal history of nicotine dependence: Secondary | ICD-10-CM | POA: Diagnosis not present

## 2017-01-16 MED ORDER — BUPROPION HCL ER (XL) 150 MG PO TB24: 150.0000 mg | ORAL_TABLET | ORAL | 2 refills | Status: DC

## 2017-01-16 MED ORDER — VENLAFAXINE HCL ER 150 MG PO CP24: ORAL_CAPSULE | ORAL | 2 refills | Status: DC

## 2017-01-16 NOTE — Progress Notes (Signed)
Psychiatric Initial Adult Assessment   Patient Identification: Claire [name] MRN:  [id number] Date of Evaluation:  01/16/2017 Referral Source: Dr. Syliva Overman Chief Complaint:   Chief Complaint    Follow-up; Depression     Visit Diagnosis:    ICD-10-CM   1. Moderate single current episode of major depressive disorder (HCC) F32.1 venlafaxine XR (EFFEXOR-XR) 150 MG 24 hr capsule    History of Present Illness:  This patient is a 36 year old single white female who lives alone in [place]. 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  The patient was initially referred by Dr. Syliva Overman for further assessment and treatment of depression. She has been seeing Florencia Reasons for several months in our office for counseling.  The patient states that she had one episode of depression previously about 10 years ago. This was during a time in which she was unemployed but she did not receive any specific treatment. She states that she had been doing well until approximately 2 years ago. At this time her sister's child was born and she felt like her parents attention was focused more for herself to the new baby. Her brother is since had a child as well. She is the only one in the family has not married it and does not have children and this makes her feel bad. 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  The patient's depression worsened over time and her primary Dr. put her on Effexor XR and the doses gradually been increased to 150 mg every morning. She states that each time its increase she feels better for about a month and then she feels worse again. Current symptoms include anhedonia, hypersomnia, feeling fatigue and low energy low motivation and occasional crying. She states that she has no interest in sex or dating. She spends most of her wake and sleeping. She talks on the phone to friends  but has no interest in getting together. Most of her social life is with her family. She denies suicidal ideation or psychotic symptoms. She does not use drugs or alcohol she's not been the victim of any trauma or abuse either in childhood or as an adult. She states that she eats too much and has gained quite a bit of weight recently and does not get any exercise her health is good except for chronic migraines which have improved with a combination of Topamax and Maxalt   The patient returns after 6 weeks. She is now on accommodation of Effexor and Wellbutrin. She's doing very well and her mood is excellent. She states that she has more energy now. 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 She is trying to get out more with friends and family. She is still on phentermine which may be helping her energy a little bit as well  Associated Signs/Symptoms: Depression Symptoms:  depressed mood, anhedonia, hypersomnia, fatigue, (Hypo) Manic Symptoms:   Anxiety Symptoms:   Psychotic Symptoms:   PTSD Symptoms:   Past Psychiatric History: The patient was depressed about 10 years ago but did not in any specific treatment.   Previous Psychotropic Medications: Yes   Substance Abuse History in the last 12 months:  No.  Consequences of Substance Abuse: NA  Past Medical History:  Past Medical History:  Diagnosis Date  . Acne    facial  . Arthritis   . Migraines   .  Nicotine addiction   . Obesity     Past Surgical History:  Procedure Laterality Date  . CHOLECYSTECTOMY      Family Psychiatric History: None   Family History:  Family History  Problem Relation Age of Onset  . Hypertension Mother   . Cancer Mother 33       colon  . Obesity Mother   . Hypertension Father   . Diabetes Father   . Hyperlipidemia Father   . Obesity Father     Social History:   Social History   Social History  . Marital status: Single    Spouse name: N/A  . Number of children: 0  . 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 Smoking status: Former Smoker    Quit date: 11/13/2008  . Smokeless tobacco: Never Used     Comment: Quit 2008, 04-02-2016 per pt stopped 2010  . Alcohol use No     Comment: occasional use  a beer once every 3 months or so, 04-02-2016 per pt occas.   . Drug use: No     Comment: 04-02-2016 per pt no  . Sexual activity: Not Currently    Birth control/ protection: Pill   Other Topics Concern  . None   Social History Narrative   Lives at home alone.   Right-handed.   Occasional use of caffeine.    Additional Social History: The patient grew up in [place] with the family later moved to [place]. She states that she had an excellent childhood and a very supportive family. She is the oldest of 3 children. 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 She went back to college in 2005 and completed an education degree and has been the 6 grade middle school teacher ever since. She's currently not in any sort of relationship   Allergies:  No Known Allergies  Metabolic Disorder Labs: Lab Results  Component Value Date   HGBA1C 5.2 11/06/2016   MPG 103 11/06/2016   MPG 108 11/06/2015   No results found for: PROLACTIN Lab Results  Component Value Date   CHOL 200 (H) 11/06/2016   TRIG 155 (H) 11/06/2016   HDL 56 11/06/2016   CHOLHDL 3.6 11/06/2016   VLDL 31 (H) 11/06/2016   LDLCALC 113 (H) 11/06/2016   LDLCALC 121 11/06/2015     Current Medications: Current Outpatient Prescriptions  Medication Sig Dispense Refill  . azelastine (ASTELIN) 0.1 % nasal spray Place 2 sprays into both nostrils 2 (two) times daily. Use in each nostril as directed 30 mL 12  . buPROPion (WELLBUTRIN XL) 150 MG 24 hr tablet Take 1 tablet (150 mg total) by mouth every morning. 30 tablet 2  . Chlorphen-Phenyleph-Ibuprofen (ADVIL ALLERGY  & CONGESTION PO) Take by mouth daily.    . Glucosamine HCl (GLUCOSAMINE PO) Take by mouth daily.    Marland Kitchen ibuprofen (ADVIL,MOTRIN) 800 MG tablet TAKE ONE TABLET BY MOUTH TWICE DAILY AS NEEDED FOR SEVERE HEADACHE. TAKE ALONG WITH IMITREX IF NEEDED. 30 tablet 2  . loratadine (CLARITIN) 10 MG tablet TAKE ONE TABLET BY MOUTH ONCE DAILY 90 tablet 3  . meclizine (ANTIVERT) 25 MG tablet Take 1 tablet (25 mg total) by mouth 3 (three) times daily as needed for dizziness. 20 tablet 0  . Multiple Vitamin (MULTIVITAMIN) tablet Take 1 tablet by mouth daily.      Marland Kitchen  phentermine (ADIPEX-P) 37.5 MG tablet Take 1 tablet (37.5 mg total) by mouth daily before breakfast. 30 tablet 1  . rizatriptan (MAXALT-MLT) 10 MG disintegrating tablet Take 1 tablet (10 mg total) by mouth as needed. May repeat in 2 hours if needed 12 tablet 11  . topiramate (TOPAMAX) 100 MG tablet Take one tablet by mouth twice daily.  No further refills until seen. 180 tablet 0  . TRI-SPRINTEC 0.18/0.215/0.25 MG-35 MCG tablet TAKE ONE TABLET BY MOUTH ONCE DAILY 28 tablet 11  . venlafaxine XR (EFFEXOR-XR) 150 MG 24 hr capsule TAKE ONE CAPSULE BY MOUTH ONCE DAILY WITH  BREAKFAST 90 capsule 2   No current facility-administered medications for this visit.     Neurologic: Headache: Yes Seizure: No Paresthesias:No  Musculoskeletal: Strength & Muscle Tone: within normal limits Gait & Station: normal Patient leans: N/A  Psychiatric Specialty Exam: Review of Systems  Neurological: Positive for headaches.  Psychiatric/Behavioral: Positive for depression.  All other systems reviewed and are negative.   Blood pressure 124/82, pulse 94, height 5' 3.5" (1.613 m), weight (!) 317 lb (143.8 kg), SpO2 98 %.Body mass index is 55.27 kg/m.  General Appearance: Casual and Well Groomed  Eye Contact:  Good  Speech:  Clear and Coherent  Volume:  Normal  Mood:  good   Affect:  Bright   Thought Process:  Goal Directed  Orientation:  Full (Time, Place, and  Person)  Thought Content:  Rumination  Suicidal Thoughts:  No  Homicidal Thoughts:  No  Memory:  Immediate;   Good Recent;   Good Remote;   Good  Judgement:  Good  Insight:  Good  Psychomotor Activity:  Decreased  Concentration:  Concentration: Good and Attention Span: Good  Recall:  Good  Fund of Knowledge:Good  Language: Good  Akathisia:  No  Handed:  Right  AIMS (if indicated):    Assets:  Communication Skills Desire for Improvement Physical Health Resilience Social Support Vocational/Educational  ADL's:  Intact  Cognition: WNL  Sleep:  too much     Treatment Plan Summary: Medication management   The patient will continue Effexor XR 150 mg daily along with Wellbutrin 150 mg daily She'll continue her counseling here and return to see me in 3 months   Diannia Ruder, MD 9/20/20184:18 PM

## 2017-01-30 ENCOUNTER — Ambulatory Visit (HOSPITAL_COMMUNITY): Payer: BC Managed Care – PPO | Admitting: Psychiatry

## 2017-02-13 ENCOUNTER — Encounter: Payer: Self-pay | Admitting: Neurology

## 2017-02-13 ENCOUNTER — Ambulatory Visit (INDEPENDENT_AMBULATORY_CARE_PROVIDER_SITE_OTHER): Payer: BC Managed Care – PPO | Admitting: Psychiatry

## 2017-02-13 ENCOUNTER — Ambulatory Visit (INDEPENDENT_AMBULATORY_CARE_PROVIDER_SITE_OTHER): Payer: BC Managed Care – PPO | Admitting: Neurology

## 2017-02-13 ENCOUNTER — Encounter (HOSPITAL_COMMUNITY): Payer: Self-pay | Admitting: Psychiatry

## 2017-02-13 VITALS — BP 158/100 | HR 68 | Ht 63.25 in | Wt 317.0 lb

## 2017-02-13 DIAGNOSIS — F321 Major depressive disorder, single episode, moderate: Secondary | ICD-10-CM

## 2017-02-13 DIAGNOSIS — G43709 Chronic migraine without aura, not intractable, without status migrainosus: Secondary | ICD-10-CM

## 2017-02-13 DIAGNOSIS — IMO0002 Reserved for concepts with insufficient information to code with codable children: Secondary | ICD-10-CM

## 2017-02-13 DIAGNOSIS — F329 Major depressive disorder, single episode, unspecified: Secondary | ICD-10-CM | POA: Diagnosis not present

## 2017-02-13 MED ORDER — RIZATRIPTAN BENZOATE 10 MG PO TBDP: 10.0000 mg | ORAL_TABLET | ORAL | 11 refills | Status: DC | PRN

## 2017-02-13 MED ORDER — TOPIRAMATE 100 MG PO TABS: ORAL_TABLET | ORAL | 4 refills | Status: DC

## 2017-02-13 NOTE — Progress Notes (Signed)
PATIENT: Claire [name] DOB: [birth year]  Chief Complaint  Patient presents with  . Follow-up    migraines doing better, taking topamax BID and maxalt prn.     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  She used to have migraine headache about once a month, right retro-orbital area severe pounding headache with associated light noise sensitivity, movement made it worse, sleep in dark quiet room was helpful, she has been taking Imitrex 100 mg with suboptimal control, often have to take it together with 800 mg of ibuprofen.  Since March 2017, she had increased migraine headaches, about once a month,  Trigger for her migraines are stress, sleep deprivation, weather change, menstruation.  She denies visual loss, denies significant side effect with Topamax, she has been taking Topamax 50 mg twice a day since 2016, which has helped her headaches some. she reported a weight gain of 15 pounds since 2016,  UPDATE Feb 13 2017: Her headache is under excellent control taking Topamax 100 mg 2 tablets every night as migraine prevention, Maxalt as needed works well, she usually gets headache around her menstruation,  REVIEW OF SYSTEMS: Full 14 system review of systems performed and notable only for As above  ALLERGIES: No Known Allergies  HOME MEDICATIONS: Current Outpatient Prescriptions  Medication Sig Dispense Refill  . azelastine (ASTELIN) 0.1 % nasal spray Place 2 sprays into both nostrils 2 (two) times daily. Use in each nostril as directed 30 mL 12  . buPROPion (WELLBUTRIN XL) 150 MG 24 hr tablet Take 1 tablet (150 mg total) by mouth every morning. 30 tablet 2  . Chlorphen-Phenyleph-Ibuprofen (ADVIL ALLERGY & CONGESTION PO) Take by  mouth daily.    . Glucosamine HCl (GLUCOSAMINE PO) Take by mouth daily.    Marland Kitchen ibuprofen (ADVIL,MOTRIN) 800 MG tablet TAKE ONE TABLET BY MOUTH TWICE DAILY AS NEEDED FOR SEVERE HEADACHE. TAKE ALONG WITH IMITREX IF NEEDED. 30 tablet 2  . loratadine (CLARITIN) 10 MG tablet TAKE ONE TABLET BY MOUTH ONCE DAILY 90 tablet 3  . meclizine (ANTIVERT) 25 MG tablet Take 1 tablet (25 mg total) by mouth 3 (three) times daily as needed for dizziness. 20 tablet 0  . Multiple Vitamin (MULTIVITAMIN) tablet Take 1 tablet by mouth daily.      . phentermine (ADIPEX-P) 37.5 MG tablet Take 1 tablet (37.5 mg total) by mouth daily before breakfast. 30 tablet 1  . rizatriptan (MAXALT-MLT) 10 MG disintegrating tablet Take 1 tablet (10 mg total) by mouth as needed. May repeat in 2 hours if needed 12 tablet 11  . topiramate (TOPAMAX) 100 MG tablet Take one tablet by mouth twice daily.  No further refills until seen. 180 tablet 0  . TRI-SPRINTEC 0.18/0.215/0.25 MG-35 MCG tablet TAKE ONE TABLET BY MOUTH ONCE DAILY 28 tablet 11  . venlafaxine XR (EFFEXOR-XR) 150 MG 24 hr capsule TAKE ONE CAPSULE BY MOUTH ONCE DAILY WITH  BREAKFAST 90 capsule 2   No current facility-administered medications for this visit.     PAST MEDICAL HISTORY: Past Medical History:  Diagnosis Date  . Acne    facial  . Arthritis   . Migraines   . Nicotine addiction   . Obesity     PAST SURGICAL HISTORY: Past Surgical  History:  Procedure Laterality Date  . CHOLECYSTECTOMY      FAMILY HISTORY: Family History  Problem Relation Age of Onset  . Hypertension Mother   . Cancer Mother [id number]       colon  . Obesity Mother   . Hypertension Father   . Diabetes Father   . Hyperlipidemia Father   . Obesity Father     SOCIAL HISTORY:  Social History   Social History  . Marital status: Single    Spouse name: N/A  . Number of children: 0  . 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 Smoking status: Former Smoker    Quit date: 11/13/2008  . Smokeless tobacco: Never Used     Comment: Quit 2008, 04-02-2016 per pt stopped 2010  . Alcohol use No     Comment: occasional use  a beer once every 3 months or so, 04-02-2016 per pt occas.   . Drug use: No     Comment: 04-02-2016 per pt no  . Sexual activity: Not Currently    Birth control/ protection: Pill   Other Topics Concern  . Not on file   Social History Narrative   Lives at home alone.   Right-handed.   Occasional use of caffeine.     PHYSICAL EXAM   Vitals:   02/13/17 0721  BP: (!) 158/100  Pulse: 68  Weight: (!) 317 lb (143.8 kg)  Height: 5' 3.25" (1.607 m)    Not recorded      Body mass index is 55.71 kg/m.  PHYSICAL EXAMNIATION:  Gen: NAD, conversant, well nourised, obese, well groomed                     Cardiovascular: Regular rate rhythm, no peripheral edema, warm, nontender. Eyes: Conjunctivae clear without exudates or hemorrhage Neck: Supple, no carotid bruise. Pulmonary: Clear to auscultation bilaterally   NEUROLOGICAL EXAM:  MENTAL STATUS: Speech:    Speech is normal; fluent and spontaneous with normal comprehension.  Cognition:     Orientation to time, place and person     Normal recent and remote memory     Normal Attention span and concentration     Normal Language, naming, repeating,spontaneous speech     Fund of knowledge   CRANIAL NERVES: CN II: Visual fields are full to confrontation. Fundoscopic exam is normal with sharp discs and no vascular changes. Pupils are round equal and briskly reactive to light. CN III, IV, VI: extraocular movement are normal. No ptosis. CN V: Facial sensation is intact to pinprick in all 3 divisions bilaterally. Corneal responses are intact.  CN VII: Face is symmetric with normal eye closure and smile. CN VIII: Hearing is normal to rubbing fingers CN IX, X: Palate elevates symmetrically. Phonation is normal. CN XI: Head turning and shoulder  shrug are intact CN XII: Tongue is midline with normal movements and no atrophy.  MOTOR: There is no pronator drift of out-stretched arms. Muscle bulk and tone are normal. Muscle strength is normal.  REFLEXES: Reflexes are 2+ and symmetric at the biceps, triceps, knees, and ankles. Plantar responses are flexor.  SENSORY: Intact to light touch, pinprick, positional sensation and vibratory sensation are intact in fingers and toes.  COORDINATION: Rapid alternating movements and fine finger movements are intact. There is no dysmetria on finger-to-nose and heel-knee-shin.    GAIT/STANCE: Posture is normal. Gait is steady with normal steps,  base, arm swing, and turning. Heel and toe walking are normal. Tandem gait is normal.  Romberg is absent.   DIAGNOSTIC DATA (LABS, IMAGING, TESTING) - I reviewed patient records, labs, notes, testing and imaging myself where available.   ASSESSMENT AND PLAN  RILEI KRAVITZ is a 36 y.o. female   Chronic migraine Obesity  Continue Topamax 100 mg 2 tablets every night  Maxalt as needed  Continue follow-up with her primary care   Levert Feinstein, M.D. Ph.D.  [facility] [address] [place], [place] [address] Ph: [phone] Fax: [phone]  CC: Kerri Perches, MD

## 2017-02-13 NOTE — Progress Notes (Signed)
              THERAPIST PROGRESS NOTE      Session Time:  Thursday 02/13/2017 4:05 PM - 4:55 PM    Participation Level: Active  Behavioral Response: CasualAlert/decreased anxiety, improved mood, less depressed  Type of Therapy: Individual Therapy  Treatment Goals addressed:   1. learn and implement behavioral strategies to overcome depression.      2. Learn and implement calming strategies to reduce/manage stress and anxiety.      3. Identify and replace thoughts and beliefs that support depression and anxiety.               Interventions: CBT and Supportive  Summary: Claire [name] is a 36 y.o. female who presents with symptoms of depression that began about a year ago. She began taking antidepressants which helped initially and had to be increased. She recently began to experience low energy and poor motivation again and has been referred for therapy and medication management by PCP Dr. Syliva OvermanMargaret Simpson. 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 Patient reports feeling lilke an outcast in her family as [place] patient's brother and sister reside. Patient's current symptoms include excessive sleeping, low energy, poor motivation, feel alone, loss of interest, loss of libido, and depressed mood.  Patient reports increased stress and decreased involvement in activity since last session. She has continued to attend work but reports feeling overwhelmed with job demands. She did go on vacation with her family last week but reports little other involvement in activity besides going to work. She also reports being very upset by a comment from her aunt while on vacation.  Suicidal/Homicidal: No  Therapist Response:  reviewed symptoms, assisted patient identify triggers of increased anxiety and  depression, reviewed early warning signs of depression and strategies to intervene including behavioral activation and positive self talk, begin to provide psychoeducation regarding the connection between thoughts/mood/and behavior   Plan: Return again in 2 weeks and implement strategies discussed in session.  Diagnosis: Axis I: MDD,     Axis II: Deferred    Claire Cirelli, LCSW 02/13/2017

## 2017-02-25 ENCOUNTER — Ambulatory Visit: Payer: Self-pay | Admitting: Family Medicine

## 2017-03-04 ENCOUNTER — Encounter: Payer: Self-pay | Admitting: Family Medicine

## 2017-03-04 ENCOUNTER — Ambulatory Visit: Payer: BC Managed Care – PPO | Admitting: Family Medicine

## 2017-03-04 VITALS — BP 118/78 | HR 80 | Resp 15 | Ht 63.0 in | Wt 312.0 lb

## 2017-03-04 DIAGNOSIS — F321 Major depressive disorder, single episode, moderate: Secondary | ICD-10-CM

## 2017-03-04 DIAGNOSIS — G44009 Cluster headache syndrome, unspecified, not intractable: Secondary | ICD-10-CM | POA: Diagnosis not present

## 2017-03-04 DIAGNOSIS — G4730 Sleep apnea, unspecified: Secondary | ICD-10-CM | POA: Diagnosis not present

## 2017-03-04 MED ORDER — PHENTERMINE HCL 37.5 MG PO TABS: 37.5000 mg | ORAL_TABLET | Freq: Every day | ORAL | 1 refills | Status: DC

## 2017-03-04 MED ORDER — CEPHALEXIN 500 MG PO CAPS: 500.0000 mg | ORAL_CAPSULE | Freq: Two times a day (BID) | ORAL | 0 refills | Status: DC

## 2017-03-04 NOTE — Patient Instructions (Signed)
F/U in 4 months, call if you ned me before  Continue half phentermine  Daily daily   Keflex twice daily for 1 week is prescribed for the sore on your left ear   You are referred to  Dr Gerilyn Pilgrimoonquah  For  Evaluation  Of sleep apnea  Thank you  for choosing [facility]. We consider it a privelige to serve you.  Delivering excellent health care in a caring and  compassionate way is our goal.  Partnering with you,  so that together we can achieve this goal is our strategy.  Please work on good  health habits so that your health will improve. 1. Commitment to daily physical activity for 30 to 60  minutes, if you are able to do this.  2. Commitment to wise food choices. Aim for half of your  food intake to be vegetable and fruit, one quarter starchy foods, and one quarter protein. Try to eat on a regular schedule  3 meals per day, snacking between meals should be limited to vegetables or fruits or small portions of nuts. 64 ounces of water per day is generally recommended, unless you have specific health conditions, like heart failure or kidney failure where you will need to limit fluid intake.  3. Commitment to sufficient and a  good quality of physical and mental rest daily, generally between 6 to 8 hours per day.  WITH PERSISTANCE AND PERSEVERANCE, THE IMPOSSIBLE , BECOMES THE NORM!

## 2017-03-08 ENCOUNTER — Encounter: Payer: Self-pay | Admitting: Family Medicine

## 2017-03-08 ENCOUNTER — Other Ambulatory Visit: Payer: Self-pay | Admitting: Family Medicine

## 2017-03-08 DIAGNOSIS — G4730 Sleep apnea, unspecified: Secondary | ICD-10-CM | POA: Insufficient documentation

## 2017-03-08 NOTE — Progress Notes (Signed)
   Claire [name]     MRN: [id number]      DOB: 09/03/1980   HPI Claire [name] is here for follow up and re-evaluation of chronic medical conditions, medication management and review of any available recent lab and radiology data.  Preventive health is updated, specifically  Cancer screening and Immunization.   Questions or concerns regarding consultations or procedures which the PT has had in the interim are  Addressed.recently saw her neurologist and has been released back to PCP for headache management since doing well on current regime. Now c/o tremor but did not discuss with neurologist, does occur at rest C/o chronic fatigue , excess snoring  And excessive daytime sleepiness, needs and agrees to sleep study The PT denies any adverse reactions to current medications since the last visit.  Currently under increased stress due to work related problems, but she has discussed this with her family and also has an appointment with her therapist upcoming. Was exercising up to 3 days  Per week and has changed some eating habits to healthier ones, states she can see the weight loss in her clothing  ROS Denies recent fever or chills. Denies sinus pressure, nasal congestion, ear pain or sore throat. Denies chest congestion, productive cough or wheezing. Denies chest pains, palpitations and leg swelling Denies abdominal pain, nausea, vomiting,diarrhea or constipation.   Denies dysuria, frequency, hesitancy or incontinence. Denies joint pain, swelling and limitation in mobility.   PE  BP 118/78   Pulse 80   Resp 15   Ht 5\' 3"  (1.6 m)   Wt (!) 312 lb (141.5 kg)   SpO2 99%   BMI 55.27 kg/m   Patient alert and oriented and in no cardiopulmonary distress.  HEENT: No facial asymmetry, EOMI,   oropharynx pink and moist.  Neck supple no JVD, no mass.  Chest: Clear to auscultation bilaterally.  CVS: S1, S2 no murmurs, no S3.Regular rate.  ABD: Soft non tender.   Ext: No edema  MS: Adequate ROM  spine, shoulders, hips and knees.  Skin: Intact, no ulcerations or rash noted.  Psych: Good eye contact, flat affect. Memory intact not anxious or depressed appearing.  CNS: CN 2-12 intact, power,  normal throughout.no focal deficits noted.   Assessment & Plan  Morbid obesity Improved, has a 4 pound weight loss , will continue phentermine Patient re-educated about  the importance of commitment to a  minimum of 150 minutes of exercise per week.  The importance of healthy food choices with portion control discussed. Encouraged to start a food diary, count calories and to consider  joining a support group. Sample diet sheets offered. Goals set by the patient for the next several months.   Weight /BMI 03/04/2017 02/13/2017 10/24/2016  WEIGHT 312 lb 317 lb 317 lb 6.4 oz  HEIGHT 5\' 3"  5' 3.25" 5\' 3"   BMI 55.27 kg/m2 55.71 kg/m2 56.22 kg/m2  Some encounter information is confidential and restricted. Go to Review Flowsheets activity to see all data.      Migraine-cluster headache syndrome Controlled on current regime and she is to be treated through this  office moving forward unless further complications occur  Depression managed by psychiatry, currently decompensated though seeming to manage well, due to work stress, ahs upcoming appt with therapist  Sleep disorder breathing Body habitus and symptoms are almost diagnostic of OSA, refer for full evaluation and mangement

## 2017-03-08 NOTE — Assessment & Plan Note (Addendum)
Controlled on current regime and she is to be treated through this  office moving forward unless further complications occur

## 2017-03-08 NOTE — Assessment & Plan Note (Signed)
managed by psychiatry, currently decompensated though seeming to manage well, due to work stress, ahs upcoming appt with therapist

## 2017-03-08 NOTE — Assessment & Plan Note (Signed)
Improved, has a 4 pound weight loss , will continue phentermine Patient re-educated about  the importance of commitment to a  minimum of 150 minutes of exercise per week.  The importance of healthy food choices with portion control discussed. Encouraged to start a food diary, count calories and to consider  joining a support group. Sample diet sheets offered. Goals set by the patient for the next several months.   Weight /BMI 03/04/2017 02/13/2017 10/24/2016  WEIGHT 312 lb 317 lb 317 lb 6.4 oz  HEIGHT 5\' 3"  5' 3.25" 5\' 3"   BMI 55.27 kg/m2 55.71 kg/m2 56.22 kg/m2  Some encounter information is confidential and restricted. Go to Review Flowsheets activity to see all data.

## 2017-03-08 NOTE — Assessment & Plan Note (Signed)
Body habitus and symptoms are almost diagnostic of OSA, refer for full evaluation and mangement

## 2017-03-10 ENCOUNTER — Ambulatory Visit (INDEPENDENT_AMBULATORY_CARE_PROVIDER_SITE_OTHER): Payer: BC Managed Care – PPO | Admitting: Psychiatry

## 2017-03-10 ENCOUNTER — Other Ambulatory Visit: Payer: Self-pay | Admitting: Family Medicine

## 2017-03-10 ENCOUNTER — Encounter (HOSPITAL_COMMUNITY): Payer: Self-pay | Admitting: Psychiatry

## 2017-03-10 DIAGNOSIS — F321 Major depressive disorder, single episode, moderate: Secondary | ICD-10-CM | POA: Diagnosis not present

## 2017-03-10 NOTE — Progress Notes (Signed)
              THERAPIST PROGRESS NOTE      Session Time:  Monday 03/10/2017 4:00 PM - 4:50 PM    Participation Level: Active  Behavioral Response: CasualAlert/increased anxiety,  Type of Therapy: Individual Therapy  Treatment Goals addressed:   1. learn and implement behavioral strategies to overcome depression.      2. Learn and implement calming strategies to reduce/manage stress and anxiety.      3. Identify and replace thoughts and beliefs that support depression and anxiety.               Interventions: CBT and Supportive  Summary: Claire [name] is a 36 y.o. female who presents with symptoms of depression that began about a year ago. She began taking antidepressants which helped initially and had to be increased. She recently began to experience low energy and poor motivation again and has been referred for therapy and medication management by PCP Dr. Syliva OvermanMargaret Simpson. 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 Patient reports feeling lilke an outcast in her family as [place]'s [place] and sister reside. Patient's current symptoms include excessive sleeping, low energy, poor motivation, feel alone, loss of interest, loss of libido, and depressed mood.  Patient reports increased stress and anxiety since last session. She expresses frustration, anger, and confusion regarding receiving a reprimand from her principal at school about 2 weeks ago. She reports initially becoming very depressed and having negative thoughts about self. She was able to eventually replace negative thoughts about self with realistic healthy alternatives. 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  She has tried to start engaging in more activities on the weekends but reports little to no involvement in any other activity besides work during the week.   Suicidal/Homicidal: No  Therapist Response:  reviewed symptoms, discussed stressors, facilitate expression of thoughts and feelings, praise and reinforced patient's efforts to identify/challenge/and replace negative thoughts with healthy alternatives, discussed the connection between thoughts/mood/and behavior using examples from patient's life, examined patient's thought patterns triggering increased anxiety, assisted patient identify ways to challenge and replace catastrophizing, reviewed lapse versus relapse, reviewed relaxation techniques, assisted patient identify ways to increase behavioral activation. improve self-care, and social involvement.  Plan: Return again in 2 weeks and implement strategies discussed in session.  Diagnosis: Axis I: MDD,     Axis II: Deferred    Claire Gallant, LCSW 03/10/2017

## 2017-03-18 ENCOUNTER — Telehealth: Payer: Self-pay | Admitting: *Deleted

## 2017-03-18 NOTE — Telephone Encounter (Signed)
Ok work excuse for today and tomorrow, she may collect work note tomorrow diarrheah all day today family reportedly has symptoms, over 5 episodes today Advised liquid diet, immodium, and BRAT diet

## 2017-03-18 NOTE — Telephone Encounter (Signed)
Can you call her and see what symptoms she is having? Vomiting? Diarrhea? Fever?  I'll send it to dr simpson afterward. Thanks

## 2017-03-18 NOTE — Telephone Encounter (Signed)
Patient called stating she needs a work note, patient states she has had the stomach bug since last night and her work told her she will need work note. Please advise [phone]

## 2017-03-18 NOTE — Telephone Encounter (Signed)
Headache, hot/cold chills, vomiting (1x around 5am), diarrhea, and body aches.

## 2017-03-18 NOTE — Telephone Encounter (Signed)
Requesting a work note since having the following symptoms. Ok to give?

## 2017-03-19 NOTE — Telephone Encounter (Signed)
Patient aware and will collect letter

## 2017-03-24 ENCOUNTER — Encounter: Payer: Self-pay | Admitting: Family Medicine

## 2017-03-25 ENCOUNTER — Telehealth (HOSPITAL_COMMUNITY): Payer: Self-pay | Admitting: *Deleted

## 2017-03-25 ENCOUNTER — Ambulatory Visit (HOSPITAL_COMMUNITY): Payer: Self-pay | Admitting: Psychiatry

## 2017-03-25 NOTE — Telephone Encounter (Signed)
left voice message, provider out of office. 

## 2017-04-17 ENCOUNTER — Encounter (HOSPITAL_COMMUNITY): Payer: Self-pay | Admitting: Psychiatry

## 2017-04-17 ENCOUNTER — Ambulatory Visit (HOSPITAL_COMMUNITY): Payer: BC Managed Care – PPO | Admitting: Psychiatry

## 2017-04-17 DIAGNOSIS — F321 Major depressive disorder, single episode, moderate: Secondary | ICD-10-CM

## 2017-04-17 DIAGNOSIS — Z79899 Other long term (current) drug therapy: Secondary | ICD-10-CM | POA: Diagnosis not present

## 2017-04-17 DIAGNOSIS — Z87891 Personal history of nicotine dependence: Secondary | ICD-10-CM

## 2017-04-17 DIAGNOSIS — F419 Anxiety disorder, unspecified: Secondary | ICD-10-CM

## 2017-04-17 MED ORDER — BUPROPION HCL ER (XL) 150 MG PO TB24: 150.0000 mg | ORAL_TABLET | ORAL | 2 refills | Status: DC

## 2017-04-17 MED ORDER — VENLAFAXINE HCL ER 150 MG PO CP24: ORAL_CAPSULE | ORAL | 2 refills | Status: DC

## 2017-04-17 NOTE — Progress Notes (Signed)
BH MD/PA/NP OP Progress Note  04/17/2017 3:12 PM Claire [name]  MRN:  [id number]  Chief Complaint:  Chief Complaint    Depression; Anxiety; Follow-up     HPI: This patient is a 36 year old single white female who lives alone in [place]. 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  The patient was initially referred by Dr. Syliva OvermanMargaret Simpson for further assessment and treatment of depression. She has been seeing Florencia ReasonsPeggy Bynum for several months in our office for counseling.  The patient states that she had one episode of depression previously about 10 years ago. This was during a time in which she was unemployed but she did not receive any specific treatment. She states that she had been doing well until approximately 2 years ago. At this time her sister's child was born and she felt like her parents attention was focused more for herself to the new baby. Her brother is since had a child as well. She is the only one in the family has not married it and does not have children and this makes her feel bad. 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  The patient's depression worsened over time and her primary Dr. put her on Effexor XR and the doses gradually been increased to 150 mg every morning. She states that each time its increase she feels better for about a month and then she feels worse again. Current symptoms include anhedonia, hypersomnia, feeling fatigue and low energy low motivation and occasional crying. She states that she has no interest in sex or dating. She spends most of her wake and sleeping. She talks on the phone to friends but has no interest in getting together. Most of her social life is with her family. She denies suicidal ideation or psychotic symptoms. She does not use drugs or alcohol she's not been the victim of any trauma or abuse either in childhood or as an adult. She  states that she eats too much and has gained quite a bit of weight recently and does not get any exercise her health is good except for chronic migraines which have improved with a combination of Topamax and Maxalt   Patient returns after 3 months.  For the most part she is doing well.  She is slowly losing weight and is trying to move more and eat in a more healthy fashion.  She is not had much contact with friends because several of moved away but she is staying busy visiting her family.  Her energy seems to be better and her outlook seems better as well. Visit Diagnosis:    ICD-10-CM   1. Moderate single current episode of major depressive disorder (HCC) F32.1 venlafaxine XR (EFFEXOR-XR) 150 MG 24 hr capsule    Past Psychiatric History: none  Past Medical History:  Past Medical History:  Diagnosis Date  . Acne    facial  . Arthritis   . Migraines   . Nicotine addiction   . Obesity     Past Surgical History:  Procedure Laterality Date  . CHOLECYSTECTOMY      Family Psychiatric History: none  Family History:  Family History  Problem Relation Age of Onset  . Hypertension Mother   . Cancer Mother [id number]       colon  . Obesity Mother   . Hypertension Father   . Diabetes Father   .  Hyperlipidemia Father   . Obesity Father     Social History:  Social History   Socioeconomic History  . Marital status: Single    Spouse name: None  . Number of children: 0  . Years of education: Bachelors  . Highest education level: None  Social Needs  . Financial resource strain: None  . Food insecurity - worry: None  . Food insecurity - inability: None  . Transportation needs - medical: None  . Transportation needs - non-medical: None  Occupational History  . 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 Smoking status: Former Smoker    Last attempt to quit: 11/13/2008    Years since quitting: 8.4  . Smokeless tobacco: Never Used  . Tobacco comment: Quit 2008, 04-02-2016 per pt  stopped 2010  Substance and Sexual Activity  . Alcohol use: No    Alcohol/week: 0.0 oz    Comment: occasional use  a beer once every 3 months or so, 04-02-2016 per pt occas.   . Drug use: No    Comment: 04-02-2016 per pt no  . Sexual activity: Not Currently    Birth control/protection: Pill  Other Topics Concern  . None  Social History Narrative   Lives at home alone.   Right-handed.   Occasional use of caffeine.    Allergies: No Known Allergies  Metabolic Disorder Labs: Lab Results  Component Value Date   HGBA1C 5.2 11/06/2016   MPG 103 11/06/2016   MPG 108 11/06/2015   No results found for: PROLACTIN Lab Results  Component Value Date   CHOL 200 (H) 11/06/2016   TRIG 155 (H) 11/06/2016   HDL 56 11/06/2016   CHOLHDL 3.6 11/06/2016   VLDL 31 (H) 11/06/2016   LDLCALC 113 (H) 11/06/2016   LDLCALC 121 11/06/2015   Lab Results  Component Value Date   TSH 1.53 11/06/2016   TSH 2.27 11/06/2015    Therapeutic Level Labs: No results found for: LITHIUM No results found for: VALPROATE No components found for:  CBMZ  Current Medications: Current Outpatient Medications  Medication Sig Dispense Refill  . azelastine (ASTELIN) 0.1 % nasal spray Place 2 sprays into both nostrils 2 (two) times daily. Use in each nostril as directed 30 mL 12  . buPROPion (WELLBUTRIN XL) 150 MG 24 hr tablet Take 1 tablet (150 mg total) by mouth every morning. 90 tablet 2  . cephALEXin (KEFLEX) 500 MG capsule Take 1 capsule (500 mg total) 2 (two) times daily by mouth. 14 capsule 0  . Chlorphen-Phenyleph-Ibuprofen (ADVIL ALLERGY & CONGESTION PO) Take by mouth daily.    . Glucosamine HCl (GLUCOSAMINE PO) Take by mouth daily.    Marland Kitchen ibuprofen (ADVIL,MOTRIN) 800 MG tablet TAKE ONE TABLET BY MOUTH TWICE DAILY AS NEEDED FOR SEVERE HEADACHE. TAKE ALONG WITH IMITREX IF NEEDED. 30 tablet 2  . ibuprofen (ADVIL,MOTRIN) 800 MG tablet TAKE 1 TABLET BY MOUTH TWICE DAILY AS NEEDED FOR  SEVERE  HEADACHE.  TAKE  ALONG   WITH  IMITREX  IF  NEEDED. 90 tablet 1  . loratadine (CLARITIN) 10 MG tablet TAKE ONE TABLET BY MOUTH ONCE DAILY 90 tablet 3  . meclizine (ANTIVERT) 25 MG tablet Take 1 tablet (25 mg total) by mouth 3 (three) times daily as needed for dizziness. 20 tablet 0  . Multiple Vitamin (MULTIVITAMIN) tablet Take 1 tablet by mouth daily.      . phentermine (ADIPEX-P) 37.5 MG tablet Take 1 tablet (37.5 mg total) daily before breakfast by mouth. 30  tablet 1  . rizatriptan (MAXALT-MLT) 10 MG disintegrating tablet Take 1 tablet (10 mg total) by mouth as needed. May repeat in 2 hours if needed 12 tablet 11  . topiramate (TOPAMAX) 100 MG tablet Take one tablet by mouth twice daily.  No further refills until seen. 180 tablet 4  . TRI-SPRINTEC 0.18/0.215/0.25 MG-35 MCG tablet TAKE ONE TABLET BY MOUTH ONCE DAILY 28 tablet 11  . venlafaxine XR (EFFEXOR-XR) 150 MG 24 hr capsule TAKE ONE CAPSULE BY MOUTH ONCE DAILY WITH  BREAKFAST 90 capsule 2   No current facility-administered medications for this visit.      Musculoskeletal: Strength & Muscle Tone: within normal limits Gait & Station: normal Patient leans: N/A  Psychiatric Specialty Exam: Review of Systems  Neurological: Positive for headaches.  All other systems reviewed and are negative.   Blood pressure 119/77, pulse 95, height 5\' 3"  (1.6 m), weight (!) 309 lb (140.2 kg), SpO2 98 %.Body mass index is 54.74 kg/m.  General Appearance: Casual and Fairly Groomed  Eye Contact:  Good  Speech:  Clear and Coherent  Volume:  Normal  Mood:  Euthymic  Affect:  Congruent  Thought Process:  Goal Directed  Orientation:  Full (Time, Place, and Person)  Thought Content: WDL   Suicidal Thoughts:  No  Homicidal Thoughts:  No  Memory:  Immediate;   Good Recent;   Good Remote;   Good  Judgement:  Fair  Insight:  Fair  Psychomotor Activity:  Normal  Concentration:  Concentration: Good and Attention Span: Good  Recall:  Good  Fund of Knowledge: Good   Language: Good  Akathisia:  No  Handed:  Right  AIMS (if indicated): not done  Assets:  Communication Skills Desire for Improvement Physical Health Resilience Social Support Talents/Skills Vocational/Educational  ADL's:  Intact  Cognition: WNL  Sleep:  Good   Screenings: PHQ2-9     Office Visit from 04/23/2016 in [facility] Office Visit from 10/10/2015 in [facility] Office Visit from 09/05/2015 in [facility] Office Visit from 11/09/2014 in [facility] Office Visit from 07/28/2014 in [facility]  PHQ-2 Total Score  0  2  6  3  4   PHQ-9 Total Score  0  6  15  13  13        Assessment and Plan: This patient is a 36 year old female with a history of depression and anxiety.  She is doing better on the combination of medications and counseling.  She will continue Effexor XR 150 mg daily as well as Wellbutrin XL 150 mg daily.  She will return to see me in 3 months   Diannia Rudereborah Joanne Brander, MD 04/17/2017, 3:12 PM

## 2017-04-18 ENCOUNTER — Ambulatory Visit (HOSPITAL_COMMUNITY): Payer: BC Managed Care – PPO | Admitting: Psychiatry

## 2017-04-28 ENCOUNTER — Ambulatory Visit (HOSPITAL_COMMUNITY): Payer: BC Managed Care – PPO | Admitting: Psychiatry

## 2017-04-28 ENCOUNTER — Encounter (HOSPITAL_COMMUNITY): Payer: Self-pay | Admitting: Psychiatry

## 2017-04-28 DIAGNOSIS — F321 Major depressive disorder, single episode, moderate: Secondary | ICD-10-CM | POA: Diagnosis not present

## 2017-04-28 NOTE — Progress Notes (Signed)
              THERAPIST PROGRESS NOTE      Session Time:  Monday 04/28/2017 4:14 PM -  4:55 PM                                 Participation Level: Active  Behavioral Response: CasualAlert/increased anxiety,  Type of Therapy: Individual Therapy  Treatment Goals addressed:   1. learn and implement behavioral strategies to overcome depression.      2. Learn and implement calming strategies to reduce/manage stress and anxiety.      3. Identify and replace thoughts and beliefs that support depression and anxiety.               Interventions: CBT and Supportive  Summary: Claire GroveSheila M Helfman is a 36 y.o. female who presents with symptoms of depression that began about a year ago. She began taking antidepressants which helped initially and had to be increased. She recently began to experience low energy and poor motivation again and has been referred for therapy and medication management by PCP Dr. Syliva OvermanMargaret Simpson. 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 Patient reports feeling lilke an outcast in her family as her [place]'s [place] and sister reside. Patient's current symptoms include excessive sleeping, low energy, poor motivation, feel alone, loss of interest, loss of libido, and depressed mood.  Patient reports doing well since last session. She reports enjoying the holidays and spending time with her family and friends. She reports less stress regarding interaction with school principal as principal made very positive comments about patient after a another classroom observation. However, patient expresses confusion about principal's actions. Patient is making efforts to focus more on areas within her control and expresses less worry about the principal's responses. However, she reports  experiencing increased stress and anxiety about school resuming. Patient expresses frustration her students do not seem to be motivated and says she does not know how to reach them.  Suicidal/Homicidal: No  Therapist Response:  reviewed symptoms, praise and reinforced patient's increased socialization and involvement in activity, discussed stressors, facilitated expression of thoughts and feelings, assisted patient identify realistic expectations of self regarding her role as a Runner, broadcasting/film/videoteacher, assisted patient identify ways to improve self-care regarding exercise along with reasons to improve self-care including improved health and stress management   Plan: Return again in 2 weeks and implement strategies discussed in session.  Diagnosis: Axis I: MDD,     Axis II: Deferred    Shirla Hodgkiss, LCSW 04/28/2017

## 2017-05-19 ENCOUNTER — Encounter (HOSPITAL_COMMUNITY): Payer: Self-pay | Admitting: Psychiatry

## 2017-05-19 ENCOUNTER — Ambulatory Visit (HOSPITAL_COMMUNITY): Payer: BC Managed Care – PPO | Admitting: Psychiatry

## 2017-05-19 DIAGNOSIS — F321 Major depressive disorder, single episode, moderate: Secondary | ICD-10-CM | POA: Diagnosis not present

## 2017-05-19 NOTE — Progress Notes (Signed)
              THERAPIST PROGRESS NOTE      Session Time:  Monday 05/19/2017 4:05 PM -  4:55 PM                            Participation Level: Active  Behavioral Response: CasualAlert/increased anxiety,  Type of Therapy: Individual Therapy  Treatment Goals addressed:   1. learn and implement behavioral strategies to overcome depression.      2. Learn and implement calming strategies to reduce/manage stress and anxiety.      3. Identify and replace thoughts and beliefs that support depression and anxiety.               Interventions: ACT/Supportive  Summary: Claire [name] is a 37 y.o. female who presents with symptoms of depression that began about a year ago. She began taking antidepressants which helped initially and had to be increased. She recently began to experience low energy and poor motivation again and has been referred for therapy and medication management by PCP Dr. Syliva OvermanMargaret Simpson. 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 Patient reports feeling lilke an outcast in her family as her [place]'s [place] and sister reside. Patient's current symptoms include excessive sleeping, low energy, poor motivation, feel alone, loss of interest, loss of libido, and depressed mood.  Patient reports increased stress since last session. She reports an incident on her job 3 weeks ago resulting in a reading reprimand being placed in patient's personnel file. She also now has pop up observations from her principal and states feeling as though she is walking on egg shells. Patient acknowledges her role in the incident but also expresses confusion and frustration regarding principal's  responses and comments. Patient reports increased anxiety, ruminating thoughts, decreased interest in activities,  and social withdrawal.   Suicidal/Homicidal: No  Therapist Response:  reviewed symptoms, facilitated expression of thoughts and feelings, assisted patient identify value congruent action to increase behavioral activation, used cognitive defusion to assist patient handle painful thoughts and feelings regarding recent incident at school    Plan: Return again in 2 weeks  Diagnosis: Axis I: MDD,     Axis II: Deferred    Rashema Seawright, LCSW 05/19/2017

## 2017-06-02 ENCOUNTER — Ambulatory Visit (HOSPITAL_COMMUNITY): Payer: BC Managed Care – PPO | Admitting: Psychiatry

## 2017-06-02 ENCOUNTER — Encounter (HOSPITAL_COMMUNITY): Payer: Self-pay | Admitting: Psychiatry

## 2017-06-02 DIAGNOSIS — F321 Major depressive disorder, single episode, moderate: Secondary | ICD-10-CM

## 2017-06-02 NOTE — Progress Notes (Signed)
              THERAPIST PROGRESS NOTE      Session Time:  Monday 06/02/2017 4:10 PM - 4:55 PM    Participation Level: Active  Behavioral Response: CasualAlert/increased anxiety,  Type of Therapy: Individual Therapy  Treatment Goals addressed:   1. learn and implement behavioral strategies to overcome depression.      2. Learn and implement calming strategies to reduce/manage stress and anxiety.      3. Identify and replace thoughts and beliefs that support depression and anxiety.               Interventions: ACT/Supportive  Summary: Claire [name] is a 37 y.o. female who presents with symptoms of depression that began about a year ago. She began taking antidepressants which helped initially and had to be increased. She recently began to experience low energy and poor motivation again and has been referred for therapy and medication management by PCP Dr. Tula Nakayama. 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 Patient reports feeling lilke an outcast in her family as her parents are moving to [place] where patient's brother and sister reside. Patient's current symptoms include excessive sleeping, low energy, poor motivation, feel alone, loss of interest, loss of libido, and depressed mood.  Patient last was seen 2 weeks ago. She reports continued stress since last session but managing it better. She reports increased observations from school personnel but not focusing on thoughts/feelings while also accepting her feelings and thoughts. She is contemplating possibly not signing the intent to return form for her job but expresses ambivalence. Patient has moved self-care and increased involvement in activity. She and a coworker someone the [employer] and patient has been 3 times since last session. She has continued to improve her  eating patterns and is pleased she has met one of her weight loss goals. She reports increased energy and no longer napping on the weekends. She has increased social involvement with family and friends and reports enjoying recent visit to [facility] to be with family.   Suicidal/Homicidal: No  Therapist Response:  reviewed symptoms, facilitated expression of thoughts and feelings, praised and reinforced patient's efforts to relate to her thoughts and feelings differently, discussed effects, assisted patient identify and discuss pros and cons of staying at her school and leaving her school, praised and reinforced patient's improved self care regarding joining the [employer] and exercising, congratulated patient on accomplishing her first weight loss goal, praised and reinforced patient's increased social involvement   Plan: Return again in 2 weeks  Diagnosis: Axis I: MDD,     Axis II: Deferred    Tishawn Friedhoff, LCSW 06/02/2017

## 2017-06-16 ENCOUNTER — Ambulatory Visit (HOSPITAL_COMMUNITY): Payer: BC Managed Care – PPO | Admitting: Psychiatry

## 2017-06-30 ENCOUNTER — Encounter (HOSPITAL_COMMUNITY): Payer: Self-pay | Admitting: Psychiatry

## 2017-06-30 ENCOUNTER — Ambulatory Visit (HOSPITAL_COMMUNITY): Payer: BC Managed Care – PPO | Admitting: Psychiatry

## 2017-06-30 DIAGNOSIS — F321 Major depressive disorder, single episode, moderate: Secondary | ICD-10-CM | POA: Diagnosis not present

## 2017-06-30 NOTE — Progress Notes (Signed)
                   THERAPIST PROGRESS NOTE      Session Time:  Monday 06/30/2017 4:17 PM -  5:00 PM        Participation Level: Active  Behavioral Response: CasualAlert/increased anxiety,  Type of Therapy: Individual Therapy  Treatment Goals addressed:   1. learn and implement behavioral strategies to overcome depression.      2. Learn and implement calming strategies to reduce/manage stress and anxiety.      3. Identify and replace thoughts and beliefs that support depression and anxiety.               Interventions: ACT/Supportive  Summary: Alvia GroveSheila M Elford is a 37 y.o. female who presents with symptoms of depression that began about a year ago. She began taking antidepressants which helped initially and had to be increased. She recently began to experience low energy and poor motivation again and has been referred for therapy and medication management by PCP Dr. Syliva OvermanMargaret Simpson. 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 Patient reports feeling lilke an outcast in her family as [place]'s [place] and sister [place]. Patient's current symptoms include excessive sleeping, low energy, poor motivation, feel alone, loss of interest, loss of libido, and depressed mood.  Patient last was seen 4 weeks ago. She reports decreased stress since last session. She reports using her assertiveness skills in having a conversation with her boss about her concerns about their interaction and patient's job. She states feeling as though a load had been lifted and receiving a receptive and positive response from her boss. Patient has continued improving self-care regarding exercise and eating patterns. She is pleased she has lost more weight. She is experiencing stress regarding the requirements inherent with  her job and has decreased social involvement with her family due to trying to meet requirements for job.  She reports having difficulty finding balance. She reports increased fatigue.  Suicidal/Homicidal: No  Therapist Response:  reviewed symptoms, facilitated expression of thoughts and feelings, praised and reinforced patient's use of assertiveness skills, discussed effects, praised and reinforced patient's improved self-care efforts, assisted patient identify ways to create balance between job responsibilities and and social involvement, assisted patient develop plan to increase social involvement with her family, assigned patient to implement plan discussed in session.    Plan: Return again in 2 weeks  Diagnosis: Axis I: MDD,     Axis II: Deferred    Skie Vitrano, LCSW 06/30/2017

## 2017-07-02 ENCOUNTER — Ambulatory Visit (INDEPENDENT_AMBULATORY_CARE_PROVIDER_SITE_OTHER): Payer: BC Managed Care – PPO | Admitting: Family Medicine

## 2017-07-02 ENCOUNTER — Encounter: Payer: Self-pay | Admitting: Family Medicine

## 2017-07-02 VITALS — BP 114/78 | HR 91 | Resp 16 | Ht 63.0 in | Wt 297.0 lb

## 2017-07-02 DIAGNOSIS — IMO0002 Reserved for concepts with insufficient information to code with codable children: Secondary | ICD-10-CM

## 2017-07-02 DIAGNOSIS — J309 Allergic rhinitis, unspecified: Secondary | ICD-10-CM | POA: Diagnosis not present

## 2017-07-02 DIAGNOSIS — F321 Major depressive disorder, single episode, moderate: Secondary | ICD-10-CM

## 2017-07-02 DIAGNOSIS — G43709 Chronic migraine without aura, not intractable, without status migrainosus: Secondary | ICD-10-CM

## 2017-07-02 DIAGNOSIS — E785 Hyperlipidemia, unspecified: Secondary | ICD-10-CM

## 2017-07-02 DIAGNOSIS — G4730 Sleep apnea, unspecified: Secondary | ICD-10-CM | POA: Diagnosis not present

## 2017-07-02 MED ORDER — PHENTERMINE HCL 37.5 MG PO TABS: 37.5000 mg | ORAL_TABLET | Freq: Every day | ORAL | 1 refills | Status: DC

## 2017-07-02 NOTE — Patient Instructions (Addendum)
Physical exam in 4 months, call if you need me before  Congrats on 5 pound weight loss each month  Continue  To work on food choice and portion size  I will refer you to pulmonary in [facility] for sleep study  I will send  Specific message about colon screen for you  Fasting lipid, cmp and EGFr, cBC and TSH 1 week before next visit and vit D

## 2017-07-05 ENCOUNTER — Encounter: Payer: Self-pay | Admitting: Family Medicine

## 2017-07-05 NOTE — Assessment & Plan Note (Signed)
Increased symptoms, needs to commit to daily treatment

## 2017-07-05 NOTE — Assessment & Plan Note (Signed)
Marked improvement, continue with psychiatry, work on increased social interaction

## 2017-07-05 NOTE — Progress Notes (Signed)
   Claire GroveSheila M [name]     MRN: [id number]      DOB: 07/07/1980   HPI Claire [name] is here for follow up and re-evaluation of chronic medical conditions, medication management and review of any available recent lab and radiology data.  Preventive health is updated, specifically  Cancer screening and Immunization.   Headaches are increased, due both to season changing and increaed stress associated with EOG exams  Improvement in depression, sees Counsellor ever 2 weeks, no change in meds yet, but has upcoming appt with psych and may benefit as she still has not started doing anyhing outside of work other than [employer] , still not making time to visit her family, but will likely soon start She has been able to stick well to her lifestyle change as far as eating is concerned and started exercise with a co worker this past 1 month, she has consistently lost 3 5 pounds every  Month, which is excellent Still needs sleep study ROS Denies recent fever or chills. Denies sinus pressure, nasal congestion, ear pain or sore throat. Denies chest congestion, productive cough or wheezing. Denies chest pains, palpitations and leg swelling Denies abdominal pain, nausea, vomiting,diarrhea or constipation.   Denies dysuria, frequency, hesitancy or incontinence. Denies joint pain, swelling and limitation in mobility. Denies skin break down or rash.   PE  BP 114/78   Pulse 91   Resp 16   Ht 5\' 3"  (1.6 m)   Wt 297 lb (134.7 kg)   SpO2 98%   BMI 52.61 kg/m   Patient alert and oriented and in no cardiopulmonary distress.  HEENT: No facial asymmetry, EOMI,   oropharynx pink and moist.  Neck supple no JVD, no mass.  Chest: Clear to auscultation bilaterally.  CVS: S1, S2 no murmurs, no S3.Regular rate.  ABD: Soft non tender.   Ext: No edema  MS: Adequate ROM spine, shoulders, hips and knees.  Skin: Intact, no ulcerations or rash noted.  Psych: Good eye contact, normal affect. Memory intact not anxious or  depressed appearing.  CNS: CN 2-12 intact, power,  normal throughout.no focal deficits noted.   Assessment & Plan  Morbid obesity Improved, pt applauded on this , and is encouraged to continue. She will continue phentermine half daily Patient re-educated about  the importance of commitment to a  minimum of 150 minutes of exercise per week.  The importance of healthy food choices with portion control discussed. Encouraged to start a food diary, count calories and to consider  joining a support group. Sample diet sheets offered. Goals set by the patient for the next several months.   Weight /BMI 07/02/2017 03/04/2017 02/13/2017  WEIGHT 297 lb 312 lb 317 lb  HEIGHT 5\' 3"  5\' 3"  5' 3.25"  BMI 52.61 kg/m2 55.27 kg/m2 55.71 kg/m2  Some encounter information is confidential and restricted. Go to Review Flowsheets activity to see all data.      Sleep disorder breathing Still needs evaluation refer [facility]  Chronic migraine Currently not well controlled, she is to use her prophylactic meds as prescribed and control allergies better  Depression Marked improvement, continue with psychiatry, work on increased social interaction  Allergic rhinitis Increased symptoms, needs to commit to daily treatment

## 2017-07-05 NOTE — Assessment & Plan Note (Signed)
Still needs evaluation refer to [facility]

## 2017-07-05 NOTE — Assessment & Plan Note (Signed)
Improved, pt applauded on this , and is encouraged to continue. She will continue phentermine half daily Patient re-educated about  the importance of commitment to a  minimum of 150 minutes of exercise per week.  The importance of healthy food choices with portion control discussed. Encouraged to start a food diary, count calories and to consider  joining a support group. Sample diet sheets offered. Goals set by the patient for the next several months.   Weight /BMI 07/02/2017 03/04/2017 02/13/2017  WEIGHT 297 lb 312 lb 317 lb  HEIGHT 5\' 3"  5\' 3"  5' 3.25"  BMI 52.61 kg/m2 55.27 kg/m2 55.71 kg/m2  Some encounter information is confidential and restricted. Go to Review Flowsheets activity to see all data.

## 2017-07-05 NOTE — Assessment & Plan Note (Signed)
Currently not well controlled, she is to use her prophylactic meds as prescribed and control allergies better

## 2017-07-14 ENCOUNTER — Encounter (HOSPITAL_COMMUNITY): Payer: Self-pay | Admitting: Psychiatry

## 2017-07-14 ENCOUNTER — Ambulatory Visit (HOSPITAL_COMMUNITY): Payer: BC Managed Care – PPO | Admitting: Psychiatry

## 2017-07-14 DIAGNOSIS — Z62820 Parent-biological child conflict: Secondary | ICD-10-CM | POA: Diagnosis not present

## 2017-07-14 DIAGNOSIS — Z566 Other physical and mental strain related to work: Secondary | ICD-10-CM | POA: Diagnosis not present

## 2017-07-14 DIAGNOSIS — F329 Major depressive disorder, single episode, unspecified: Secondary | ICD-10-CM | POA: Diagnosis not present

## 2017-07-14 DIAGNOSIS — F321 Major depressive disorder, single episode, moderate: Secondary | ICD-10-CM

## 2017-07-14 NOTE — Progress Notes (Signed)
                   THERAPIST PROGRESS NOTE      Session Time:  Monday 3/18/ 2019 4:17 PM - 5:00 PM          Participation Level: Active  Behavioral Response: CasualAlert/increased anxiety,  Type of Therapy: Individual Therapy  Treatment Goals addressed:   1. learn and implement behavioral strategies to overcome depression.      2. Learn and implement calming strategies to reduce/manage stress and anxiety.      3. Identify and replace thoughts and beliefs that support depression and anxiety.               Interventions: ACT/Supportive  Summary: Alvia GroveSheila M Trefry is a 37 y.o. female who presents with symptoms of depression that began about a year ago. She began taking antidepressants which helped initially and had to be increased. She recently began to experience low energy and poor motivation again and has been referred for therapy and medication management by PCP Dr. Syliva OvermanMargaret Simpson. 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 Patient reports feeling lilke an outcast in her family as [place]'s [place] and sister reside. Patient's current symptoms include excessive sleeping, low energy, poor motivation, feel alone, loss of interest, loss of libido, and depressed mood.  Patient last was seen 2 weeks ago. She reports increased stress and fatigue since last session. She also reports excessive sleeping and [facility]. Patient reports stress related to job demands and upcoming testing. She admits pressuring self to cover material in class but talking with another teacher who helped her adjust her expectations. She did socialize with family 2 weekends ago but reports little to no activity outside of work since then.  She continues to exercise but has reduced frequency.  Suicidal/Homicidal: No  Therapist Response:  reviewed symptoms, facilitated expression of thoughts and feelings, praised and reinforced patient's involvement with family, assisted patient review ways to create balance between job responsibilities and and social involvement/self care, assisted patient develop plan and identify activities to increase time for self-care/self-nurture,  assigned patient to implement plan discussed in session.    Plan: Return again in 2 weeks  Diagnosis: Axis I: MDD,     Axis II: Deferred    Cornesha Radziewicz, LCSW 07/14/2017

## 2017-07-16 ENCOUNTER — Encounter (HOSPITAL_COMMUNITY): Payer: Self-pay | Admitting: Psychiatry

## 2017-07-16 ENCOUNTER — Ambulatory Visit (HOSPITAL_COMMUNITY): Payer: BC Managed Care – PPO | Admitting: Psychiatry

## 2017-07-16 DIAGNOSIS — F321 Major depressive disorder, single episode, moderate: Secondary | ICD-10-CM

## 2017-07-16 DIAGNOSIS — Z87891 Personal history of nicotine dependence: Secondary | ICD-10-CM

## 2017-07-16 MED ORDER — BUPROPION HCL ER (XL) 300 MG PO TB24: 300.0000 mg | ORAL_TABLET | ORAL | 2 refills | Status: DC

## 2017-07-16 MED ORDER — VENLAFAXINE HCL ER 150 MG PO CP24: ORAL_CAPSULE | ORAL | 2 refills | Status: DC

## 2017-07-16 NOTE — Progress Notes (Signed)
BH MD/PA/NP OP Progress Note  07/16/2017 4:46 PM Claire [name]  MRN:  [id number]  Chief Complaint:  Chief Complaint    Depression; Follow-up     HPI: This patient is a 37 year old single white female who lives alone in [place]. 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  The patient was initially referred by Dr. Syliva Overman for further assessment and treatment of depression. She has been seeing Florencia Reasons for several months in our office for counseling.  The patient states that she had one episode of depression previously about 10 years ago. This was during a time in which she was unemployed but she did not receive any specific treatment. She states that she had been doing well until approximately 2 years ago. At this time her sister's child was born and she felt like her parents attention was focused more for herself to the new baby. Her brother is since had a child as well. She is the only one in the family has not married it and does not have children and this makes her feel bad. 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  The patient's depression worsened over time and her primary Dr. put her on Effexor XR and the doses gradually been increased to 150 mg every morning. She states that each time its increase she feels better for about a month and then she feels worse again. Current symptoms include anhedonia, hypersomnia, feeling fatigue and low energy low motivation and occasional crying. She states that she has no interest in sex or dating. She spends most of her wake and sleeping. She talks on the phone to friends but has no interest in getting together. Most of her social life is with her family. She denies suicidal ideation or psychotic symptoms. She does not use drugs or alcohol she's not been the victim of any trauma or abuse either in childhood or as an adult. She states that  she eats too much and has gained quite a bit of weight recently and does not get any exercise her health is good except for chronic migraines which have improved with a combination of Topamax and Maxalt   The patient returns after 3 months.  She states for a while she is doing really well for the last few weeks she has been more withdrawn and tired.  She is sleeping a lot on the weekends again.  She is going to [facility] 3 times a week and she has lost about 30 pounds in the last several months.  She is on phentermine which has helped curb her appetite.  She denies being suicidal but realizes she needs to make more plans with friends and family and become more social.  All of her laboratories have been normal except for cholesterol.  I suggested that we increase her Wellbutrin and keep the Effexor the same for now Visit Diagnosis:    ICD-10-CM   1. Moderate single current episode of major depressive disorder (HCC) F32.1 venlafaxine XR (EFFEXOR-XR) 150 MG 24 hr capsule    Past Psychiatric History: None  Past Medical History:  Past Medical History:  Diagnosis Date  . Acne    facial  . Arthritis   . Migraines   . Nicotine addiction   . Obesity     Past Surgical History:  Procedure Laterality Date  . CHOLECYSTECTOMY  Family Psychiatric History: none  Family History:  Family History  Problem Relation Age of Onset  . Hypertension Mother   . Cancer Mother 69       colon  . Obesity Mother   . Hypertension Father   . Diabetes Father   . Hyperlipidemia Father   . Obesity Father     Social History:  Social History   Socioeconomic History  . Marital status: Single    Spouse name: None  . Number of children: 0  . Years of education: Bachelors  . Highest education level: None  Social Needs  . Financial resource strain: None  . Food insecurity - worry: None  . Food insecurity - inability: None  . Transportation needs - medical: None  . Transportation needs -  non-medical: None  Occupational History  . 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 Smoking status: Former Smoker    Last attempt to quit: 11/13/2008    Years since quitting: 8.6  . Smokeless tobacco: Never Used  . Tobacco comment: Quit 2008, 04-02-2016 per pt stopped 2010  Substance and Sexual Activity  . Alcohol use: No    Alcohol/week: 0.0 oz    Comment: occasional use  a beer once every 3 months or so, 04-02-2016 per pt occas.   . Drug use: No    Comment: 04-02-2016 per pt no  . Sexual activity: Not Currently    Birth control/protection: Pill  Other Topics Concern  . None  Social History Narrative   Lives at home alone.   Right-handed.   Occasional use of caffeine.    Allergies: No Known Allergies  Metabolic Disorder Labs: Lab Results  Component Value Date   HGBA1C 5.2 11/06/2016   MPG 103 11/06/2016   MPG 108 11/06/2015   No results found for: PROLACTIN Lab Results  Component Value Date   CHOL 200 (H) 11/06/2016   TRIG 155 (H) 11/06/2016   HDL 56 11/06/2016   CHOLHDL 3.6 11/06/2016   VLDL 31 (H) 11/06/2016   LDLCALC 113 (H) 11/06/2016   LDLCALC 121 11/06/2015   Lab Results  Component Value Date   TSH 1.53 11/06/2016   TSH 2.27 11/06/2015    Therapeutic Level Labs: No results found for: LITHIUM No results found for: VALPROATE No components found for:  CBMZ  Current Medications: Current Outpatient Medications  Medication Sig Dispense Refill  . Chlorphen-Phenyleph-Ibuprofen (ADVIL ALLERGY & CONGESTION PO) Take by mouth daily.    . Glucosamine HCl (GLUCOSAMINE PO) Take by mouth daily.    Marland Kitchen ibuprofen (ADVIL,MOTRIN) 800 MG tablet TAKE ONE TABLET BY MOUTH TWICE DAILY AS NEEDED FOR SEVERE HEADACHE. TAKE ALONG WITH IMITREX IF NEEDED. 30 tablet 2  . ibuprofen (ADVIL,MOTRIN) 800 MG tablet TAKE 1 TABLET BY MOUTH TWICE DAILY AS NEEDED FOR  SEVERE  HEADACHE.  TAKE  ALONG  WITH  IMITREX  IF  NEEDED. 90 tablet 1  . loratadine (CLARITIN) 10 MG tablet TAKE ONE  TABLET BY MOUTH ONCE DAILY 90 tablet 3  . Multiple Vitamin (MULTIVITAMIN) tablet Take 1 tablet by mouth daily.      . phentermine (ADIPEX-P) 37.5 MG tablet Take 1 tablet (37.5 mg total) by mouth daily before breakfast. 30 tablet 1  . rizatriptan (MAXALT-MLT) 10 MG disintegrating tablet Take 1 tablet (10 mg total) by mouth as needed. May repeat in 2 hours if needed 12 tablet 11  . topiramate (TOPAMAX) 100 MG tablet Take one tablet by mouth twice daily.  No further refills  until seen. 180 tablet 4  . TRI-SPRINTEC 0.18/0.215/0.25 MG-35 MCG tablet TAKE ONE TABLET BY MOUTH ONCE DAILY 28 tablet 11  . venlafaxine XR (EFFEXOR-XR) 150 MG 24 hr capsule TAKE ONE CAPSULE BY MOUTH ONCE DAILY WITH  BREAKFAST 90 capsule 2  . buPROPion (WELLBUTRIN XL) 300 MG 24 hr tablet Take 1 tablet (300 mg total) by mouth every morning. 30 tablet 2   No current facility-administered medications for this visit.      Musculoskeletal: Strength & Muscle Tone: within normal limits Gait & Station: normal Patient leans: N/A  Psychiatric Specialty Exam: Review of Systems  Psychiatric/Behavioral: Positive for depression.  All other systems reviewed and are negative.   Blood pressure 122/83, pulse 87, height 5\' 3"  (1.6 m), weight 294 lb (133.4 kg), SpO2 98 %.Body mass index is 52.08 kg/m.  General Appearance: Casual, Neat and Well Groomed  Eye Contact:  Good  Speech:  Clear and Coherent  Volume:  Normal  Mood:  Dysphoric  Affect:  Flat  Thought Process:  Goal Directed  Orientation:  Full (Time, Place, and Person)  Thought Content: WDL   Suicidal Thoughts:  No  Homicidal Thoughts:  No  Memory:  Immediate;   Good Recent;   Good Remote;   Good  Judgement:  Good  Insight:  Good  Psychomotor Activity:  Decreased  Concentration:  Concentration: Good and Attention Span: Good  Recall:  Good  Fund of Knowledge: Good  Language: Good  Akathisia:  No  Handed:  Right  AIMS (if indicated): not done  Assets:   Communication Skills Desire for Improvement Physical Health Resilience Social Support Talents/Skills Vocational/Educational  ADL's:  Intact  Cognition: WNL  Sleep:  Good   Screenings: PHQ2-9     [facility]/2017 in [facility]2016 in [facility] Visit from 10/10/2015 in [facility] Visit from 09/05/2015 in [facility] 11/[facility]/2014 in [facility]  PHQ-2 Total Score  1  0  2  6  3   PHQ-9 Total Score  6  0  6  15  13        Assessment and Plan: This patient is a 37 year old female with a history of depression.  She seems to be getting more fatigued and withdrawn.  We will increase Wellbutrin XL from 150-300 mg every morning.  She will continue Effexor XR 150 mg every morning.  She will return to see me in 6 weeks or call sooner if needed   Diannia Rudereborah Clora Ohmer, MD 07/16/2017, 4:46 PM

## 2017-08-14 ENCOUNTER — Encounter (HOSPITAL_COMMUNITY): Payer: Self-pay | Admitting: Psychiatry

## 2017-08-14 ENCOUNTER — Ambulatory Visit (HOSPITAL_COMMUNITY): Payer: BC Managed Care – PPO | Admitting: Psychiatry

## 2017-08-14 DIAGNOSIS — F321 Major depressive disorder, single episode, moderate: Secondary | ICD-10-CM

## 2017-08-14 NOTE — Progress Notes (Signed)
                   THERAPIST PROGRESS NOTE      Session Time:  Thursday 4:14 PM-  4:58 PM       Participation Level: Active  Behavioral Response: CasualAlert/increased anxiety,  Type of Therapy: Individual Therapy  Treatment Goals addressed:   1. learn and implement behavioral strategies to overcome depression.      2. Learn and implement calming strategies to reduce/manage stress and anxiety.              3. Identify and replace thoughts and beliefs that support depression and anxiety.               Interventions: CBT/Supportive  Summary: Claire [name] is a 37 y.o. female who presents with symptoms of depression that began about a year ago. She began taking antidepressants which helped initially and had to be increased. She recently began to experience low energy and poor motivation again and has been referred for therapy and medication management by PCP Dr. Syliva OvermanMargaret Simpson. 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 Patient reports feeling lilke an outcast in her family as [place] sister [place]. Patient's current symptoms include excessive sleeping, low energy, poor motivation, feel alone, loss of interest, loss of libido, and depressed mood.  Patient last was seen 4 weeks ago. She reports taking increased dosage of Wellbutrin as instructed by Dr. Tenny Crawoss. She reports it has probably helped and says the fatigue she ie experiencing now is mainly related to the volume of work she is doing along and to stress related to new expectations regarding EOG testing. 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 She admits pressuring self. She is doing well in maintaining healthy eating patterns and consistent physical  activity. She has improved efforts in creating balance between job responsibilities and social involvement.   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

## 2017-08-27 ENCOUNTER — Ambulatory Visit (HOSPITAL_COMMUNITY): Payer: BC Managed Care – PPO | Admitting: Psychiatry

## 2017-08-28 ENCOUNTER — Ambulatory Visit (HOSPITAL_COMMUNITY): Payer: BC Managed Care – PPO | Admitting: Psychiatry

## 2017-09-03 ENCOUNTER — Ambulatory Visit (HOSPITAL_COMMUNITY): Payer: BC Managed Care – PPO | Admitting: Psychiatry

## 2017-09-11 ENCOUNTER — Ambulatory Visit (HOSPITAL_COMMUNITY): Payer: BC Managed Care – PPO | Admitting: Psychiatry

## 2017-09-11 ENCOUNTER — Encounter (HOSPITAL_COMMUNITY): Payer: Self-pay | Admitting: Psychiatry

## 2017-09-11 DIAGNOSIS — F321 Major depressive disorder, single episode, moderate: Secondary | ICD-10-CM | POA: Diagnosis not present

## 2017-09-11 NOTE — Progress Notes (Signed)
                   THERAPIST PROGRESS NOTE      Session Time:  Thursday 09/11/2017 4:10 PM -  5:10 PM          Participation Level: Active  Behavioral Response: CasualAlert/increased anxiety,  Type of Therapy: Individual Therapy  Treatment Goals addressed:   1. learn and implement behavioral strategies to overcome depression.      2. Learn and implement calming strategies to reduce/manage stress and anxiety.              3. Identify and replace thoughts and beliefs that support depression and anxiety.               Interventions: CBT/Supportive  Summary: Claire [name] is a 38 y.o. female who presents with symptoms of depression that began about a year ago. She began taking antidepressants which helped initially and had to be increased. She recently began to experience low energy and poor motivation again and has been referred for therapy and medication management by PCP Dr. Syliva Overman. 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 Patient reports feeling lilke an outcast in her family as her parents are moving to [place] where patient's brother and sister reside. Patient's current symptoms include excessive sleeping, low energy, poor motivation, feel alone, loss of interest, loss of libido, and depressed mood.  Patient reports continued stress and experiencing increased symptoms of depression since last session. Triggers include demands at work, behavioral issues from patient's students, and misunderstanding with a coworkers. Patient expresses frustration regarding students' behaviors and her perceived lack of support from administration. Patient reports feelings of hopelessness and helplessness regarding managing classroom management. She reports she started isolating self from coworkers after one of them who also is her  gym buddy stopped going to the gym. Patient reports having thoughts of gym buddy quitting because of something patient said or done but did not talk with her about it. Eventually, she talked with another co-worker who informed her the buddy was having health issues. Patient reports she has resumed contact with coworker and now feeling more supported. She also is resuming attending gym and having more contact with her family.   Suicidal/Homicidal: No  Therapist Response:  reviewed symptoms, assisted patient identify triggers of increased symptoms of depression, discussed the cognitive model with patient using handout and examples from patient's life, used practice exercises to assist patient understand the connection between thoughts/mood/behavior, provided patient with practice exercises for homework, bring to next session, praised and reinforced patient's efforts to improve self-care and behavioral activation     Plan: Return again in 2 weeks  Diagnosis: Axis I: MDD,     Axis II: Deferred    Sherrilynn Gudgel, LCSW 09/11/2017

## 2017-09-16 ENCOUNTER — Other Ambulatory Visit: Payer: Self-pay | Admitting: Family Medicine

## 2017-10-02 ENCOUNTER — Encounter (HOSPITAL_COMMUNITY): Payer: Self-pay | Admitting: Psychiatry

## 2017-10-02 ENCOUNTER — Ambulatory Visit (HOSPITAL_COMMUNITY): Payer: BC Managed Care – PPO | Admitting: Psychiatry

## 2017-10-02 DIAGNOSIS — F321 Major depressive disorder, single episode, moderate: Secondary | ICD-10-CM

## 2017-10-02 NOTE — Progress Notes (Signed)
                   THERAPIST PROGRESS NOTE      Session Time:  Thursday 10/02/2017  4:05 PM -  5:08 PM     Participation Level: Active  Behavioral Response: CasualAlert/increased anxiety,  Type of Therapy: Individual Therapy  Treatment Goals addressed:   1. learn and implement behavioral strategies to overcome depression.      2. Learn and implement calming strategies to reduce/manage stress and anxiety.              3. Identify and replace thoughts and beliefs that support depression and anxiety.               Interventions: CBT/Supportive  Summary: Claire GroveSheila M [name] is a 37 y.o. female who presents with symptoms of depression that began about a year ago. She began taking antidepressants which helped initially and had to be increased. She recently began to experience low energy and poor motivation again and has been referred for therapy and medication management by PCP Dr. Syliva OvermanMargaret Simpson. 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 Patient reports feeling lilke an outcast in her family as her [place] are [place]'s brother and sister reside. Patient's current symptoms include excessive sleeping, low energy, poor motivation, feel alone, loss of interest, loss of libido, and depressed mood.  Patient reports continued stress related to her job since last session. She expresses relief school ended for the summer earlier this week. She expresses sadness today as she her father informed her last week he will not be able to continue their weekly breakfast time. He is switching from working 3rd shift to first shift. Patient fears losing close connection with father as they will not have their father/daughter time. Patient completed homework assignment. She reports having a better understanding of the connection  between thoughts/mood/and behavior. She reports co-workers have commented she can be negative.  Suicidal/Homicidal: No  Therapist Response:  Facilitated expression of thoughts and feelings regarding father switching to 1st shift, used cognitive reappraisal to assist patient identify, challenge, and replace unhelpful thought about their relationship with healthy alternative, assisted patient identify alternative ways to maintain consistent contact and closeness in relationship, assisted patient develop plan to discuss concerns and possible suggestions with father, reviewed homework, reviewed the connection between thoughts, mood, and behavior using homework and examples from patient's life, introduced and discussed common cognitive distortions, assisted patient to begin to identify distortions she experiences most frequently, assigned patient to complete reviewing cognitive distortions handout and select the 3 she experiences most frequently, assigned patient to implement plan to talk with father, obtained commitment from patient to complete homework  Plan: Return again in 2 weeks  Diagnosis: Axis I: MDD,     Axis II: Deferred    BYNUM,PEGGY, LCSW 10/02/2017

## 2017-10-16 ENCOUNTER — Ambulatory Visit (HOSPITAL_COMMUNITY): Payer: BC Managed Care – PPO | Admitting: Psychiatry

## 2017-10-19 ENCOUNTER — Other Ambulatory Visit (HOSPITAL_COMMUNITY): Payer: Self-pay | Admitting: Psychiatry

## 2017-10-22 ENCOUNTER — Ambulatory Visit (HOSPITAL_COMMUNITY): Payer: BC Managed Care – PPO | Admitting: Psychiatry

## 2017-10-22 DIAGNOSIS — F321 Major depressive disorder, single episode, moderate: Secondary | ICD-10-CM

## 2017-10-22 NOTE — Progress Notes (Signed)
                   THERAPIST PROGRESS NOTE      Session Time:  Wednesday 10/21/2017      1:12 PM - 2:10 PM  Participation Level: Active  Behavioral Response: CasualAlert/increased anxiety,  Type of Therapy: Individual Therapy  Treatment Goals addressed:   1. learn and implement behavioral strategies to overcome depression.      2. Learn and implement calming strategies to reduce/manage stress and anxiety.              3. Identify and replace thoughts and beliefs that support depression and anxiety.               Interventions: CBT/Supportive  Summary: Claire [name] is a 37 y.o. female who presents with symptoms of depression that began about a year ago. She began taking antidepressants which helped initially and had to be increased. She recently began to experience low energy and poor motivation again and has been referred for therapy and medication management by PCP Dr. Syliva OvermanMargaret Simpson. 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 Patient reports feeling lilke an outcast in her family as her [place]'s brother and sister reside. Patient's current symptoms include excessive sleeping, low energy, poor motivation, feel alone, loss of interest, loss of libido, and depressed mood.  Patient reports decreased stress since last session. She has become more involved in positive care and demonstrates increased commitment to her weight management efforts. She reports attending gym consistently. She has increased social involvement and reports going out to dinner and a play with a friend. She reports she is spending more time at her parents' home on weekends. However, she has talked with father about establishing an alternative weekly meal time. She says she hasn't had the opportunity as grandchildren  have been around most of the time. She reports she felt a little down recently when leaving her parents home as none of her family visit her in [place] but expect her to travel to see them. She reports feeling taking for granted. She also states feeling like she is outside the family again. Suicidal/Homicidal: No  Therapist Response:  Praised and reinforced patient's improved self-care, increased physical activity and socialization, discussed effects on mood, discussed thoughts and processes that inhibited patient implementing plan to talk with father, assisted patient identify ways to address, facilitated expression of thoughts and feelings regarding family not visiting patient in [place], used cognitive reappraisal to assist patient identify, challenge, and replace unhelpful thought about situation with healthy alternative using a thought record,  assisted patient identify ways to improve assertiveness skills to family regarding her concerns and feelings, discussed rationale and practiced completing DTR, assigned patient to complete DTR and bring to next session.  Plan: Return again in 2 weeks  Diagnosis: Axis I: MDD,     Axis II: Deferred    Tirzah Fross, LCSW 10/22/2017

## 2017-10-28 ENCOUNTER — Other Ambulatory Visit (HOSPITAL_COMMUNITY): Payer: Self-pay | Admitting: Psychiatry

## 2017-10-28 ENCOUNTER — Telehealth (HOSPITAL_COMMUNITY): Payer: Self-pay | Admitting: *Deleted

## 2017-10-28 MED ORDER — BUPROPION HCL ER (XL) 300 MG PO TB24: 300.0000 mg | ORAL_TABLET | ORAL | 2 refills | Status: DC

## 2017-10-28 NOTE — Telephone Encounter (Signed)
Next Appt is 11/19/17

## 2017-10-28 NOTE — Telephone Encounter (Signed)
Dr Tenny Crawoss Patient called in requesting refill on Wellbutrin

## 2017-10-28 NOTE — Telephone Encounter (Signed)
Sent, she will need to make appt

## 2017-11-05 ENCOUNTER — Ambulatory Visit (INDEPENDENT_AMBULATORY_CARE_PROVIDER_SITE_OTHER): Payer: BC Managed Care – PPO | Admitting: Family Medicine

## 2017-11-05 ENCOUNTER — Encounter: Payer: Self-pay | Admitting: Family Medicine

## 2017-11-05 VITALS — BP 124/78 | HR 85 | Temp 98.9°F | Resp 16 | Ht 63.0 in | Wt 294.0 lb

## 2017-11-05 DIAGNOSIS — G4730 Sleep apnea, unspecified: Secondary | ICD-10-CM

## 2017-11-05 DIAGNOSIS — J309 Allergic rhinitis, unspecified: Secondary | ICD-10-CM

## 2017-11-05 DIAGNOSIS — R21 Rash and other nonspecific skin eruption: Secondary | ICD-10-CM

## 2017-11-05 DIAGNOSIS — Z0000 Encounter for general adult medical examination without abnormal findings: Secondary | ICD-10-CM

## 2017-11-05 DIAGNOSIS — Z800 Family history of malignant neoplasm of digestive organs: Secondary | ICD-10-CM

## 2017-11-05 LAB — COMPLETE METABOLIC PANEL WITH GFR
AG Ratio: 1.5 (calc) (ref 1.0–2.5)
ALBUMIN MSPROF: 4 g/dL (ref 3.6–5.1)
ALKALINE PHOSPHATASE (APISO): 89 U/L (ref 33–115)
ALT: 11 U/L (ref 6–29)
AST: 11 U/L (ref 10–30)
BILIRUBIN TOTAL: 0.3 mg/dL (ref 0.2–1.2)
BUN: 12 mg/dL (ref 7–25)
CHLORIDE: 109 mmol/L (ref 98–110)
CO2: 22 mmol/L (ref 20–32)
Calcium: 9.2 mg/dL (ref 8.6–10.2)
Creat: 1.07 mg/dL (ref 0.50–1.10)
GFR, EST AFRICAN AMERICAN: 77 mL/min/{1.73_m2} (ref >=60)
GFR, Est Non African American: 67 mL/min/{1.73_m2} (ref >=60)
Globulin: 2.6 g/dL (calc) (ref 1.9–3.7)
Glucose, Bld: 91 mg/dL (ref 65–99)
Potassium: 4.2 mmol/L (ref 3.5–5.3)
SODIUM: 141 mmol/L (ref 135–146)
TOTAL PROTEIN: 6.6 g/dL (ref 6.1–8.1)

## 2017-11-05 LAB — LIPID PANEL
CHOL/HDL RATIO: 4.1 (calc) (ref <5.0)
CHOLESTEROL: 216 mg/dL — AB (ref <200)
HDL: 53 mg/dL (ref >50)
LDL CHOLESTEROL (CALC): 125 mg/dL — AB
NON-HDL CHOLESTEROL (CALC): 163 mg/dL — AB (ref <130)
Triglycerides: 238 mg/dL — ABNORMAL HIGH (ref <150)

## 2017-11-05 LAB — CBC
HEMATOCRIT: 38.4 % (ref 35.0–45.0)
HEMOGLOBIN: 12.9 g/dL (ref 11.7–15.5)
MCH: 29.5 pg (ref 27.0–33.0)
MCHC: 33.6 g/dL (ref 32.0–36.0)
MCV: 87.7 fL (ref 80.0–100.0)
MPV: 10.4 fL (ref 7.5–12.5)
Platelets: 372 10*3/uL (ref 140–400)
RBC: 4.38 10*6/uL (ref 3.80–5.10)
RDW: 12.8 % (ref 11.0–15.0)
WBC: 9.1 10*3/uL (ref 3.8–10.8)

## 2017-11-05 LAB — TSH: TSH: 2.55 mIU/L

## 2017-11-05 LAB — VITAMIN D 25 HYDROXY (VIT D DEFICIENCY, FRACTURES): Vit D, 25-Hydroxy: 46 ng/mL (ref 30–100)

## 2017-11-05 MED ORDER — BENZONATATE 100 MG PO CAPS: 100.0000 mg | ORAL_CAPSULE | Freq: Two times a day (BID) | ORAL | 0 refills | Status: DC | PRN

## 2017-11-05 NOTE — Patient Instructions (Addendum)
F/U in 5 months, call if you need me before   please reduce fried and fatty foods   Weight loss goal of 10 to 15 pounds with regular exercise and change in food choice and regular eating.you cAN do this, 1500 calorie sheet provided as a guide  I will be in touch re colonoscopy based on family history  I will refer you to dermatology as we discussed   I have sent in decongestants for you  Call in next 1 week if symptoms worsen I will send in an antibiotic at that ime

## 2017-11-05 NOTE — Progress Notes (Signed)
Claire GroveSheila M [name]     MRN: [id number]      DOB: 07/14/1980  HPI: Patient is in for annual physical exam. 3 day h/o head and chest congestion green draiage and occasionally sputum  Recent labs, if available are reviewed. Immunization is reviewed , and  Is up to date.   PE: BP 124/78   Pulse 85   Temp 98.9 F (37.2 C) (Oral)   Resp 16   Ht 5\' 3"  (1.6 m)   Wt 294 lb (133.4 kg)   SpO2 99%   BMI 52.08 kg/m   Pleasant  female, alert and oriented x 3, in no cardio-pulmonary distress. Afebrile. HEENT No facial trauma or asymetry. Sinuses non tender.  Extra occullar muscles intact, External ears normal, tympanic membranes clear.Right TM mildly erythematous ,however there is a good light reflex Oropharynx moist, no exudate. Neck: supple, no adenopathy,JVD or thyromegaly.No bruits.  Chest: Clear to ascultation bilaterally.No crackles or wheezes. Non tender to palpation  Breast: No asymetry,no masses or lumps. No tenderness. No nipple discharge or inversion. No axillary or supraclavicular adenopathy  Cardiovascular system; Heart sounds normal,  S1 and  S2 ,no S3.  No murmur, or thrill. Apical beat not displaced Peripheral pulses normal.  Abdomen: Soft, non tender, no organomegaly or masses. No bruits. Bowel sounds normal. No guarding, tenderness or rebound. Normal BM, no change in stool caliber or frequency  Rectal:  No exam done , not indicated  GU: Asymptomatic, no exam indicated  Musculoskeletal exam: Full ROM of spine, hips , shoulders and both  Have  knees.  deformity ,swelling and  crepitus noted.of both knees No muscle wasting or atrophy.   Neurologic: Cranial nerves 2 to 12 intact. Power, tone ,sensation and reflexes normal throughout. No disturbance in gait. No tremor.  Skin: Intact, no ulceration, erythema , scaling or rash noted. Hyperpigmented macular lesions on lower extremities which blanche on directs pressure , increasing in size, right leg more  affected than left legPsych; Normal mood and affect. Judgement and concentration normal   Assessment & Plan:  Annual physical exam Annual exam as documented. Counseling done  re healthy lifestyle involving commitment to 150 minutes exercise per week, heart healthy diet, and attaining healthy weight.The importance of adequate sleep also discussed. Regular seat belt use and home safety, is also discussed. Changes in health habits are decided on by the patient with goals and time frames  set for achieving them. Immunization and cancer screening needs are specifically addressed at this visit.   Rash and nonspecific skin eruption Hyperpigmented macular rash on lower extremitates, right more than left for approximately 1 year, increasing in size and numbers, blanch on direct pressure, dermatology to evaluate  Sleep disorder breathing Reports snoring , excessive daytime sleepiness and chronic headaches, evaluation for sleep apnea is overdue , refer for evaluation will verify with pt where she will go before entering, as we have ben down this road before  Morbid obesity Deteriorated. Patient re-educated about  the importance of commitment to a  minimum of 150 minutes of exercise per week.  The importance of healthy food choices with portion control discussed. Encouraged to start a food diary, count calories and to consider  joining a support group. Sample diet sheets offered. Goals set by the patient for the next several months.of 10 pound in 5 months   Weight /BMI 11/05/2017 07/02/2017 03/04/2017  WEIGHT 294 lb 297 lb 312 lb  HEIGHT 5\' 3"  5\' 3"  5\' 3"   BMI 52.08  kg/m2 52.61 kg/m2 55.27 kg/m2  Some encounter information is confidential and restricted. Go to Review Flowsheets activity to see all data.  Pt  Elects drug  Holiday from phentermine , which is very appropriate, she is encouraged to look at group management like weight watchers    Allergic sinusitis Current flare, no s/s of infection,  pt to use daily claaritin and tessalon perles are prescribed. If she develops symptoms of infection in the next 1 week, I will  rx an antibiotic

## 2017-11-06 ENCOUNTER — Ambulatory Visit (HOSPITAL_COMMUNITY): Payer: BC Managed Care – PPO | Admitting: Psychiatry

## 2017-11-08 ENCOUNTER — Other Ambulatory Visit: Payer: Self-pay | Admitting: Family Medicine

## 2017-11-08 ENCOUNTER — Encounter: Payer: Self-pay | Admitting: Family Medicine

## 2017-11-08 DIAGNOSIS — Z800 Family history of malignant neoplasm of digestive organs: Secondary | ICD-10-CM | POA: Insufficient documentation

## 2017-11-08 DIAGNOSIS — J309 Allergic rhinitis, unspecified: Secondary | ICD-10-CM | POA: Insufficient documentation

## 2017-11-08 NOTE — Assessment & Plan Note (Signed)
Current flare, no s/s of infection, pt to use daily claaritin and tessalon perles are prescribed. If she develops symptoms of infection in the next 1 week, I will  rx an antibiotic

## 2017-11-08 NOTE — Assessment & Plan Note (Signed)

## 2017-11-08 NOTE — Assessment & Plan Note (Addendum)
Reports snoring , excessive daytime sleepiness and chronic headaches, evaluation for sleep apnea is overdue , refer for evaluation will verify with pt where she will go before entering, as we have ben down this road before

## 2017-11-08 NOTE — Assessment & Plan Note (Signed)
Hyperpigmented macular rash on lower extremitates, right more than left for approximately 1 year, increasing in size and numbers, blanch on direct pressure, dermatology to evaluate

## 2017-11-08 NOTE — Assessment & Plan Note (Addendum)
Deteriorated. Patient re-educated about  the importance of commitment to a  minimum of 150 minutes of exercise per week.  The importance of healthy food choices with portion control discussed. Encouraged to start a food diary, count calories and to consider  joining a support group. Sample diet sheets offered. Goals set by the patient for the next several months.of 10 pound in 5 months   Weight /BMI 11/05/2017 07/02/2017 03/04/2017  WEIGHT 294 lb 297 lb 312 lb  HEIGHT 5\' 3"  5\' 3"  5\' 3"   BMI 52.08 kg/m2 52.61 kg/m2 55.27 kg/m2  Some encounter information is confidential and restricted. Go to Review Flowsheets activity to see all data.  Pt  Elects drug  Holiday from phentermine , which is very appropriate, she is encouraged to look at group management like weight watchers

## 2017-11-12 ENCOUNTER — Telehealth: Payer: Self-pay | Admitting: Family Medicine

## 2017-11-12 NOTE — Telephone Encounter (Signed)
SCHEDULED W/ DR.MCCONNELL AUG 8 AT 11AM --LEFT DETAILED MSG ON PATIENTS VOICEMAIL.

## 2017-11-13 ENCOUNTER — Other Ambulatory Visit: Payer: Self-pay | Admitting: Family Medicine

## 2017-11-13 ENCOUNTER — Encounter: Payer: Self-pay | Admitting: Family Medicine

## 2017-11-13 DIAGNOSIS — G4730 Sleep apnea, unspecified: Secondary | ICD-10-CM

## 2017-11-13 MED ORDER — NORGESTIM-ETH ESTRAD TRIPHASIC 0.18/0.215/0.25 MG-35 MCG PO TABS: 1.0000 | ORAL_TABLET | Freq: Every day | ORAL | 3 refills | Status: DC

## 2017-11-19 ENCOUNTER — Encounter (HOSPITAL_COMMUNITY): Payer: Self-pay | Admitting: Psychiatry

## 2017-11-19 ENCOUNTER — Ambulatory Visit (HOSPITAL_COMMUNITY): Payer: BC Managed Care – PPO | Admitting: Psychiatry

## 2017-11-19 DIAGNOSIS — F321 Major depressive disorder, single episode, moderate: Secondary | ICD-10-CM | POA: Diagnosis not present

## 2017-11-19 MED ORDER — VENLAFAXINE HCL ER 150 MG PO CP24: ORAL_CAPSULE | ORAL | 2 refills | Status: DC

## 2017-11-19 MED ORDER — BUPROPION HCL ER (XL) 300 MG PO TB24: 300.0000 mg | ORAL_TABLET | ORAL | 2 refills | Status: DC

## 2017-11-19 NOTE — Progress Notes (Signed)
BH MD/PA/NP OP Progress Note  11/19/2017 2:08 PM Claire [name]  MRN:  [id number]  Chief Complaint:  Chief Complaint    Depression; Follow-up     HPI: This patient is a 37 year old single white female who lives alone in [place]. 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  The patient was initially referred by Dr. Syliva OvermanMargaret Simpson for further assessment and treatment of depression. She has been seeing Florencia ReasonsPeggy Bynum for several months in our office for counseling.  The patient states that she had one episode of depression previously about 10 years ago. This was during a time in which she was unemployed but she did not receive any specific treatment. She states that she had been doing well until approximately 2 years ago. At this time her sister's child was born and she felt like her parents attention was focused more for herself to the new baby. Her brother is since had a child as well. She is the only one in the family has not married it and does not have children and this makes her feel bad. 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  The patient's depression worsened over time and her primary Dr. put her on Effexor XR and the doses gradually been increased to 150 mg every morning. She states that each time its increase she feels better for about a month and then she feels worse again. Current symptoms include anhedonia, hypersomnia, feeling fatigue and low energy low motivation and occasional crying. She states that she has no interest in sex or dating. She spends most of her wake and sleeping. She talks on the phone to friends but has no interest in getting together. Most of her social life is with her family. She denies suicidal ideation or psychotic symptoms. She does not use drugs or alcohol she's not been the victim of any trauma or abuse either in childhood or as an adult. She states that  she eats too much and has gained quite a bit of weight recently and does not get any exercise her health is good except for chronic migraines which have improved with a combination of Topamax and Maxalt  The patient returns after 4 months.  She states she was off the Wellbutrin for about a week because of problems with the pharmacy.  She could tell a definite difference when she was off it and when she went back on it she became less depressed again.  Her main issue right now is that she is not sleeping well.  She often wakes up through the night and does not get good sleep.  She feels tired through most of the day and often has to take naps.  [name] has sent her for a sleep study and she has to get this set up.  I explained that we could add medicine to help her sleep but I had rather wait till we get the results of the sleep study.  Most of the time her mood is pretty good and she is continuing to lose weight steadily.  She wishes her energy and motivation were better but I wonder if this has to do with sleep apnea. Visit Diagnosis:    ICD-10-CM   1. Moderate single current episode of major depressive disorder (HCC) F32.1 venlafaxine XR (EFFEXOR-XR) 150 MG 24 hr capsule    Past Psychiatric History: none  Past Medical History:  Past Medical History:  Diagnosis Date  . Acne    facial  . Arthritis   . Migraines   . Nicotine addiction   . Obesity     Past Surgical History:  Procedure Laterality Date  . CHOLECYSTECTOMY      Family Psychiatric History: None  Family History:  Family History  Problem Relation Age of Onset  . Hypertension Mother   . Cancer Mother 26       colon  . Obesity Mother   . Hypertension Father   . Diabetes Father   . Hyperlipidemia Father   . Obesity Father     Social History:  Social History   Socioeconomic History  . Marital status: Single    Spouse name: Not on file  . Number of children: 0  . Years of education: Bachelors  . Highest  education level: Not on file  Occupational History  . 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 Financial resource strain: Not on file  . Food insecurity:    Worry: Not on file    Inability: Not on file  . Transportation needs:    Medical: Not on file    Non-medical: Not on file  Tobacco Use  . Smoking status: Former Smoker    Last attempt to quit: 11/13/2008    Years since quitting: 9.0  . Smokeless tobacco: Never Used  . Tobacco comment: Quit 2008, 04-02-2016 per pt stopped 2010  Substance and Sexual Activity  . Alcohol use: No    Alcohol/week: 0.0 oz    Comment: occasional use  a beer once every 3 months or so, 04-02-2016 per pt occas.   . Drug use: No    Comment: 04-02-2016 per pt no  . Sexual activity: Not Currently    Birth control/protection: Pill  Lifestyle  . Physical activity:    Days per week: Not on file    Minutes per session: Not on file  . Stress: Not on file  Relationships  . Social connections:    Talks on phone: Not on file    Gets together: Not on file    Attends religious service: Not on file    Active member of club or organization: Not on file    Attends meetings of clubs or organizations: Not on file    Relationship status: Not on file  Other Topics Concern  . Not on file  Social History Narrative   Lives at home alone.   Right-handed.   Occasional use of caffeine.    Allergies: No Known Allergies  Metabolic Disorder Labs: Lab Results  Component Value Date   HGBA1C 5.2 11/06/2016   MPG 103 11/06/2016   MPG 108 11/06/2015   No results found for: PROLACTIN Lab Results  Component Value Date   CHOL 216 (H) 11/04/2017   TRIG 238 (H) 11/04/2017   HDL 53 11/04/2017   CHOLHDL 4.1 11/04/2017   VLDL 31 (H) 11/06/2016   LDLCALC 125 (H) 11/04/2017   LDLCALC 113 (H) 11/06/2016   Lab Results  Component Value Date   TSH 2.55 11/04/2017   TSH 1.53 11/06/2016    Therapeutic Level Labs: No results found for: LITHIUM No results found for:  VALPROATE No components found for:  CBMZ  Current Medications: Current Outpatient Medications  Medication Sig Dispense Refill  . benzonatate (TESSALON) 100 MG capsule Take 1 capsule (100 mg total) by mouth 2 (two) times daily as needed for cough. 20 capsule 0  .  buPROPion (WELLBUTRIN XL) 300 MG 24 hr tablet Take 1 tablet (300 mg total) by mouth every morning. 30 tablet 2  . Chlorphen-Phenyleph-Ibuprofen (ADVIL ALLERGY & CONGESTION PO) Take by mouth daily.    . Glucosamine HCl (GLUCOSAMINE PO) Take by mouth daily.    Marland Kitchen ibuprofen (ADVIL,MOTRIN) 800 MG tablet TAKE ONE TABLET BY MOUTH TWICE DAILY AS NEEDED FOR SEVERE HEADACHE. TAKE ALONG WITH IMITREX IF NEEDED. 30 tablet 2  . loratadine (CLARITIN) 10 MG tablet TAKE ONE TABLET BY MOUTH ONCE DAILY 90 tablet 3  . Multiple Vitamin (MULTIVITAMIN) tablet Take 1 tablet by mouth daily.      . Norgestimate-Ethinyl Estradiol Triphasic (TRI-SPRINTEC) 0.18/0.215/0.25 MG-35 MCG tablet Take 1 tablet by mouth daily. 3 Package 3  . rizatriptan (MAXALT-MLT) 10 MG disintegrating tablet Take 1 tablet (10 mg total) by mouth as needed. May repeat in 2 hours if needed 12 tablet 11  . topiramate (TOPAMAX) 100 MG tablet Take one tablet by mouth twice daily.  No further refills until seen. 180 tablet 4  . TRI-SPRINTEC 0.18/0.215/0.25 MG-35 MCG tablet TAKE 1 TABLET BY MOUTH ONCE DAILY 28 tablet 3  . venlafaxine XR (EFFEXOR-XR) 150 MG 24 hr capsule TAKE ONE CAPSULE BY MOUTH ONCE DAILY WITH  BREAKFAST 90 capsule 2   No current facility-administered medications for this visit.      Musculoskeletal: Strength & Muscle Tone: within normal limits Gait & Station: normal Patient leans: N/A  Psychiatric Specialty Exam: Review of Systems  Skin: Positive for rash.  Psychiatric/Behavioral: The patient has insomnia.     Blood pressure 137/82, pulse 87, height 5\' 3"  (1.6 m), weight 291 lb (132 kg), SpO2 98 %.Body mass index is 51.55 kg/m.  General Appearance: Casual and  Fairly Groomed  Eye Contact:  Good  Speech:  Clear and Coherent  Volume:  Normal  Mood:  Euthymic  Affect:  Congruent  Thought Process:  Goal Directed  Orientation:  Full (Time, Place, and Person)  Thought Content: WDL   Suicidal Thoughts:  No  Homicidal Thoughts:  No  Memory:  Immediate;   Good Recent;   Good Remote;   Good  Judgement:  Good  Insight:  Good  Psychomotor Activity:  Decreased  Concentration:  Concentration: Good and Attention Span: Good  Recall:  Good  Fund of Knowledge: Good  Language: Good  Akathisia:  No  Handed:  Right  AIMS (if indicated): not done  Assets:  Communication Skills Desire for Improvement Physical Health Resilience Social Support Talents/Skills Vocational/Educational  ADL's:  Intact  Cognition: Normal  Sleep:  Fair   Screenings: PHQ2-9     Office Visit from 11/05/2017 in [facility] Office Visit from 07/02/2017 in [facility] Office Visit from 04/23/2016 in [facility] Office Visit from 10/10/2015 in [facility] Office Visit from 09/05/2015 in [facility]  PHQ-2 Total Score  2  1  0  2  6  PHQ-9 Total Score  9  6  0  6  15       Assessment and Plan: This patient is a 37 year old female with a history of depression .  Overall she is doing better than she used to on the combination of Wellbutrin XL 300 mg daily and Effexor XR 150 mg daily.  However her sleep seems disrupted.  Before we prescribe anything for sleep I would like to see the results of the sleep study.  She will return to see me in 2 months   Gavin Pound  Tenny Craw, MD 11/19/2017, 2:08 PM

## 2017-11-27 ENCOUNTER — Ambulatory Visit (HOSPITAL_COMMUNITY): Payer: BC Managed Care – PPO | Admitting: Psychiatry

## 2017-11-27 DIAGNOSIS — F321 Major depressive disorder, single episode, moderate: Secondary | ICD-10-CM | POA: Diagnosis not present

## 2017-11-27 NOTE — Progress Notes (Signed)
                   THERAPIST PROGRESS NOTE      Session Time:  Thursday 11/27/2017 4:20 PM - 5:00 PM        Participation Level: Active  Behavioral Response: CasualAlert/decreased anxiety  Type of Therapy: Individual Therapy  Treatment Goals addressed:   1. learn and implement behavioral strategies to overcome depression.      2. Learn and implement calming strategies to reduce/manage stress and anxiety.              3. Identify and replace thoughts and beliefs that support depression and anxiety.               Interventions: CBT/Supportive  Summary: Claire GroveSheila M [name] is a 37 y.o. female who presents with symptoms of depression that began about a year ago. She began taking antidepressants which helped initially and had to be increased. She recently began to experience low energy and poor motivation again and has been referred for therapy and medication management by PCP Dr. Syliva OvermanMargaret Simpson. 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 Patient reports feeling lilke an outcast in her family as [place]'s brother and sister reside. Patient's current symptoms include excessive sleeping, low energy, poor motivation, feel alone, loss of interest, loss of libido, and depressed mood.  Patient reports doing well since last session. She maintains positive care and demonstrates increased commitment to her weight management efforts. She reports attending gym consistently. She has been spending more time with her [place] in [place] and reports enjoying this very much. She also enjoyed recent family trip to [place] to visit grandparents. Patient reports having more realistic expectations of self and students for next school year. She reports initially feeling down about constructive criticism from  principal about her interaction with students but reports rethinking the situation and having less negative thoughts about self. She also reports being able to engage in more problem solving and having more creativity about managing her classroom this upcoming year. She reports she misplaced forms for homework and did not complete. She expresses some anxiety about returning to to school in three weeks.    Suicidal/Homicidal: No  Therapist Response:  Reviewed symptoms, praised and reinforced patient's efforts to identify and challenge her thoughts, reviewed connection between thoughts/mood/behavior using examples from patient's life, discussed ways to dispute negative thoughts, discussed role of self-care in avoiding relapse and preparing to return to her job this month  Plan: Return again in 2 weeks  Diagnosis: Axis I: MDD,     Axis II: Deferred    Aliyana Dlugosz, LCSW 11/27/2017

## 2017-12-04 ENCOUNTER — Encounter: Payer: Self-pay | Admitting: Pulmonary Disease

## 2017-12-04 ENCOUNTER — Ambulatory Visit (INDEPENDENT_AMBULATORY_CARE_PROVIDER_SITE_OTHER): Payer: BC Managed Care – PPO | Admitting: Pulmonary Disease

## 2017-12-04 VITALS — BP 130/90 | HR 105 | Ht 63.0 in | Wt 284.0 lb

## 2017-12-04 DIAGNOSIS — G4733 Obstructive sleep apnea (adult) (pediatric): Secondary | ICD-10-CM | POA: Diagnosis not present

## 2017-12-04 NOTE — Patient Instructions (Signed)
High probability of significant sleep disordered breathing  Obesity-losing weight recently  Hypersomnia  We will set you up with a home sleep study  Possibility of treating sleep apnea with auto titrating CPAP  Continue to work on weight loss  I will see you back in the office in 6 to 8 weeks

## 2017-12-04 NOTE — Progress Notes (Signed)
Claire GroveSheila M [name]    [id number]    02/02/1981  Primary Care Physician:Simpson, Milus MallickMargaret E, MD  Referring Physician: Kerri PerchesSimpson, Margaret E, MD [address] [place], [place] [address]  Chief complaint:   Hypersomnia, nonrestorative sleep Restless sleep, fatigue  HPI:  Patient with hypersomnia nonrestorative sleep With above times during the night Naps during the day from fatigue History of depression Usually goes to bed between 9 -10 PM, tries to wake up at 7-8 a.m. Dad may have sleep apnea-has not been evaluated   Pets:cats Occupation:teacher Exposures: No mold exposure Smoking history: Reformed smoker  Outpatient Encounter Medications as of 12/04/2017  Medication Sig  . buPROPion (WELLBUTRIN XL) 300 MG 24 hr tablet Take 1 tablet (300 mg total) by mouth every morning.  . Chlorphen-Phenyleph-Ibuprofen (ADVIL ALLERGY & CONGESTION PO) Take by mouth daily.  . Glucosamine HCl (GLUCOSAMINE PO) Take by mouth daily.  Marland Kitchen. ibuprofen (ADVIL,MOTRIN) 800 MG tablet TAKE ONE TABLET BY MOUTH TWICE DAILY AS NEEDED FOR SEVERE HEADACHE. TAKE ALONG WITH IMITREX IF NEEDED.  Marland Kitchen. loratadine (CLARITIN) 10 MG tablet TAKE ONE TABLET BY MOUTH ONCE DAILY  . Multiple Vitamin (MULTIVITAMIN) tablet Take 1 tablet by mouth daily.    . Norgestimate-Ethinyl Estradiol Triphasic (TRI-SPRINTEC) 0.18/0.215/0.25 MG-35 MCG tablet Take 1 tablet by mouth daily.  . rizatriptan (MAXALT-MLT) 10 MG disintegrating tablet Take 1 tablet (10 mg total) by mouth as needed. May repeat in 2 hours if needed  . topiramate (TOPAMAX) 100 MG tablet Take one tablet by mouth twice daily.  No further refills until seen.  . venlafaxine XR (EFFEXOR-XR) 150 MG 24 hr capsule TAKE ONE CAPSULE BY MOUTH ONCE DAILY WITH  BREAKFAST  . [DISCONTINUED] benzonatate (TESSALON) 100 MG capsule Take 1 capsule (100 mg total) by mouth 2 (two) times daily as needed for cough.  . [DISCONTINUED] citalopram (CELEXA) 20 MG tablet Dose increase  effective 10/22/2010, pls discontinue  10mg  dose  . [DISCONTINUED] TRI-SPRINTEC 0.18/0.215/0.25 MG-35 MCG tablet TAKE 1 TABLET BY MOUTH ONCE DAILY   No facility-administered encounter medications on file as of 12/04/2017.     Allergies as of 12/04/2017  . (No Known Allergies)    Past Medical History:  Diagnosis Date  . Acne    facial  . Arthritis   . Migraines   . Nicotine addiction   . Obesity     Past Surgical History:  Procedure Laterality Date  . CHOLECYSTECTOMY      Family History  Problem Relation Age of Onset  . Hypertension Mother   . Cancer Mother 150       colon  . Obesity Mother   . Hypertension Father   . Diabetes Father   . Hyperlipidemia Father   . Obesity Father     Social History   Socioeconomic History  . Marital status: Single    Spouse name: Not on file  . Number of children: 0  . Years of education: Bachelors  . Highest education level: Not on file  Occupational History  . 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 Financial resource strain: Not on file  . Food insecurity:    Worry: Not on file    Inability: Not on file  . Transportation needs:    Medical: Not on file    Non-medical: Not on file  Tobacco Use  . Smoking status: Former Smoker    Last attempt to quit: 11/13/2008    Years since quitting: 9.0  .  Smokeless tobacco: Never Used  . Tobacco comment: Quit 2008, 04-02-2016 per pt stopped 2010  Substance and Sexual Activity  . Alcohol use: No    Alcohol/week: 0.0 standard drinks    Comment: occasional use  a beer once every 3 months or so, 04-02-2016 per pt occas.   . Drug use: No    Comment: 04-02-2016 per pt no  . Sexual activity: Not Currently    Birth control/protection: Pill  Lifestyle  . Physical activity:    Days per week: Not on file    Minutes per session: Not on file  . Stress: Not on file  Relationships  . Social connections:    Talks on phone: Not on file    Gets together: Not on file    Attends religious  service: Not on file    Active member of club or organization: Not on file    Attends meetings of clubs or organizations: Not on file    Relationship status: Not on file  . Intimate partner violence:    Fear of current or ex partner: Not on file    Emotionally abused: Not on file    Physically abused: Not on file    Forced sexual activity: Not on file  Other Topics Concern  . Not on file  Social History Narrative   Lives at home alone.   Right-handed.   Occasional use of caffeine.    Review of systems: Review of Systems  Constitutional: Negative for fever and chills.  HENT: Negative.   Eyes: Negative for blurred vision.  Respiratory: as per HPI  Cardiovascular: Negative for chest pain and palpitations.  Gastrointestinal: Negative for vomiting, diarrhea, blood per rectum. Genitourinary: Negative for dysuria, urgency, frequency and hematuria.  Musculoskeletal: Negative for myalgias, back pain and joint pain.  Skin: Negative for itching and rash.  Neurological: Negative for dizziness, tremors, focal weakness, seizures and loss of consciousness.  Endo/Heme/Allergies: Negative for environmental allergies.  Psychiatric/Behavioral: History of depression Nonrestorative sleep, daytime fatigue.  All other systems reviewed and are negative.  Physical Exam:  Vitals:   12/04/17 1430  BP: 130/90  Pulse: (!) 105  SpO2: 99%   Gen:      No acute distress HEENT:  EOMI, sclera anicteric, crowded oropharynx, Mallampati 4 Neck:     No masses; no thyromegaly Lungs:    Clear to auscultation bilaterally; normal respiratory effort CV:         Regular rate and rhythm; no murmurs Abd:      + bowel sounds; soft, non-tender; no palpable masses, no distension Ext:    No edema; adequate peripheral perfusion Skin:      Warm and dry; no rash Neuro: alert and oriented x 3 Psych: normal mood and affect  Data Reviewed: Recent lab work from 11/04/2017 reviewed Normal TSH, normal CBC and  Chem-7  Assessment:  High probability of significant sleep disordered breathing Morbid obesity Nonrestorative sleep Hypersomnia  Plan/Recommendations:  Split-night study  [employer] does not cover home sleep study)  Encouraged to continue working on weight loss  Pathophysiology of sleep disordered breathing reviewed  Treatment options discussed   Virl Diamond MD [facility] 12/04/2017, 3:00 PM  CC: Kerri Perches, MD

## 2017-12-19 ENCOUNTER — Ambulatory Visit: Payer: BC Managed Care – PPO | Attending: Pulmonary Disease | Admitting: Pulmonary Disease

## 2017-12-19 DIAGNOSIS — G4736 Sleep related hypoventilation in conditions classified elsewhere: Secondary | ICD-10-CM | POA: Diagnosis not present

## 2017-12-19 DIAGNOSIS — G4733 Obstructive sleep apnea (adult) (pediatric): Secondary | ICD-10-CM | POA: Insufficient documentation

## 2017-12-25 NOTE — Procedures (Signed)
  POLYSOMNOGRAPHY  Last, First: Claire [name], Claire [name]: [id number] Gender: Female Age (years): 36 Weight (lbs): 284 DOB: [birth year] BMI: 50 Primary Care: Claire [name] Epworth Score: 6 Referring: Claire LightningAdewale A Olalere MD Technician: Derrell LollingPeak, Robert Interpreting: Claire LightningAdewale A Olalere MD Study Type: NPSG Ordered Study Type: Split Night CPAP Study date: 12/19/2017 Location: [place] CLINICAL INFORMATION Claire HoardSheila [name] is a 37 year old Female and was referred to the sleep center for evaluation of N/A. Indications include OSA.  MEDICATIONS Patient self administered medications include: N/A. Medications administered during study include No sleep medicine administered.  SLEEP STUDY TECHNIQUE A multi-channel overnight Polysomnography study was performed. The channels recorded and monitored were central and occipital EEG, electrooculogram (EOG), submentalis EMG (chin), nasal and oral airflow, thoracic and abdominal wall motion, anterior tibialis EMG, snore microphone, electrocardiogram, and a pulse oximetry. TECHNICIAN COMMENTS Comments added by Technician: Patient talked in his/her sleep. Comments added by Scorer: N/A SLEEP ARCHITECTURE The study was initiated at 9:59:18 PM and terminated at 5:19:24 AM. The total recorded time was 440.1 minutes. EEG confirmed total sleep time was 383.8 minutes yielding a sleep efficiency of 87.2%%. Sleep onset after lights out was 36.8 minutes with a REM latency of 232.5 minutes. The patient spent 11.7%% of the night in stage N1 sleep, 74.8%% in stage N2 sleep, 3.6%% in stage N3 and 9.9% in REM. Wake after sleep onset (WASO) was 19.5 minutes. The Arousal Index was 13.4/hour. RESPIRATORY PARAMETERS There were a total of 1 respiratory disturbances out of which 0 were apneas ( 0 obstructive, 0 mixed, 0 central) and 1 hypopneas. The apnea/hypopnea index (AHI) was 0.2 events/hour. The central sleep apnea index was 0.0 events/hour. The REM AHI was 1.6 events/hour and NREM AHI  was 0.0 events/hour. The supine AHI was 0.0 events/hour and the non supine AHI was 0.5 supine during 69.39% of sleep. Respiratory disturbances were associated with oxygen desaturation down to a nadir of 91.0% during sleep. The mean oxygen saturation during the study was 94.4%. The cumulative time under 88% oxygen saturation was 5.5 minutes.  LEG MOVEMENT DATA The total leg movements were 0 with a resulting leg movement index of 0.0/hr .Associated arousal with leg movement index was 0.0/hr.  CARDIAC DATA The underlying cardiac rhythm was most consistent with sinus rhythm. Mean heart rate during sleep was 63.3 bpm. Additional rhythm abnormalities include None.   IMPRESSIONS - No Significant Obstructive Sleep apnea(OSA) - EKG showed no cardiac abnormalities. - Mild Oxygen Desaturation - The patient snored with soft snoring volume. - No significant periodic leg movements(PLMs) during sleep.   DIAGNOSIS - Nocturnal Hypoxemia (327.26 [G47.36 ICD-10])   RECOMMENDATIONS - Avoid alcohol, sedatives and other CNS depressants that may worsen sleep apnea and disrupt normal sleep architecture. - Sleep hygiene should be reviewed to assess factors that may improve sleep quality. - Weight management and regular exercise should be initiated or continued.  [Electronically signed] 12/25/2017 09:02 PM  Claire DiamondAdewale Olalere MD NPI: [id number]

## 2018-01-14 ENCOUNTER — Ambulatory Visit (HOSPITAL_COMMUNITY): Payer: BC Managed Care – PPO | Admitting: Psychiatry

## 2018-01-14 DIAGNOSIS — F321 Major depressive disorder, single episode, moderate: Secondary | ICD-10-CM | POA: Diagnosis not present

## 2018-01-14 NOTE — Progress Notes (Signed)
                   THERAPIST PROGRESS NOTE      Session Time:  Wednesday 01/14/2018 4:15 PM - 4:59 PM        Participation Level: Active  Behavioral Response: CasualAlert/decreased anxiety  Type of Therapy: Individual Therapy  Treatment Goals addressed:   1. learn and implement behavioral strategies to overcome depression.      2. Learn and implement calming strategies to reduce/manage stress and anxiety.              3. Identify and replace thoughts and beliefs that support depression and anxiety.               Interventions: CBT/Supportive  Summary: Claire [name] is a 37 y.o. female who presents with symptoms of depression that began about a year ago. She began taking antidepressants which helped initially and had to be increased. She recently began to experience low energy and poor motivation again and has been referred for therapy and medication management by PCP Dr. Syliva OvermanMargaret Simpson. 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 Patient reports feeling lilke an outcast in her family as [place]'s [place] and sister reside. Patient's current symptoms include excessive sleeping, low energy, poor motivation, feel alone, loss of interest, loss of libido, and depressed mood.  Patient last was seen about six weeks ago. 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 She has been working overtime and is experiencing increased fatigue. She reports she has not been to [facility] a month as she doesn't feel like doing anything but going to sleep when she gets home from work. Although work is challenging, patient reports thinking  and responding to students positively. She also has changed her voice tone and has received compliments from school personnel per her report.  She has maintained healthy eating patterns and is maintaining social involvement with her family. She is pleased family members have visited her and she has continued to visit them in [place].   Suicidal/Homicidal: No  Therapist Response:  Reviewed symptoms,  praised and reinforced patient's efforts to identify, challenge, and replace unhelpful thoughts with helpful thoughts, reviewed connection between thoughts/mood/behavior using examples from patient's life, praised and reinforced continued social involvement, assisted patient develop plan to resume attendance at [facility] ( plan to resume this Saturday), discussed and addressed thoughts and processes that may inhibit implementation of plan,   Plan: Return again in 2 weeks  Diagnosis: Axis I: MDD,     Axis II: Deferred    Cherie Lasalle, LCSW 01/14/2018

## 2018-01-20 ENCOUNTER — Encounter (HOSPITAL_COMMUNITY): Payer: Self-pay | Admitting: Psychiatry

## 2018-01-20 ENCOUNTER — Ambulatory Visit (HOSPITAL_COMMUNITY): Payer: BC Managed Care – PPO | Admitting: Psychiatry

## 2018-01-20 DIAGNOSIS — Z87891 Personal history of nicotine dependence: Secondary | ICD-10-CM

## 2018-01-20 DIAGNOSIS — F321 Major depressive disorder, single episode, moderate: Secondary | ICD-10-CM

## 2018-01-20 MED ORDER — BUPROPION HCL ER (XL) 300 MG PO TB24: 300.0000 mg | ORAL_TABLET | ORAL | 2 refills | Status: DC

## 2018-01-20 MED ORDER — VENLAFAXINE HCL ER 150 MG PO CP24: 300.0000 mg | ORAL_CAPSULE | Freq: Every day | ORAL | 2 refills | Status: DC

## 2018-01-20 NOTE — Progress Notes (Signed)
BH MD/PA/NP OP Progress Note  01/20/2018 4:12 PM Claire [name]  MRN:  [id number]  Chief Complaint:  Chief Complaint    Depression; Anxiety; Follow-up     HPI: This patient is a 37 year old single white female who lives alone in [place]. 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  The patient was initially referred by Dr. Syliva Overman for further assessment and treatment of depression. She has been seeing Florencia Reasons for several months in our office for counseling.  The patient states that she had one episode of depression previously about 10 years ago. This was during a time in which she was unemployed but she did not receive any specific treatment. She states that she had been doing well until approximately 2 years ago. At this time her sister's child was born and she felt like her parents attention was focused more for herself to the new baby. Her brother is since had a child as well. She is the only one in the family has not married it and does not have children and this makes her feel bad. 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  The patient's depression worsened over time and her primary Dr. put her on Effexor XR and the doses gradually been increased to 150 mg every morning. She states that each time its increase she feels better for about a month and then she feels worse again. Current symptoms include anhedonia, hypersomnia, feeling fatigue and low energy low motivation and occasional crying. She states that she has no interest in sex or dating. She spends most of her wake and sleeping. She talks on the phone to friends but has no interest in getting together. Most of her social life is with her family. She denies suicidal ideation or psychotic symptoms. She does not use drugs or alcohol she's not been the victim of any trauma or abuse either in childhood or as an adult. She  states that she eats too much and has gained quite a bit of weight recently and does not get any exercise her health is good except for chronic migraines which have improved with a combination of Topamax and Maxalt  The patient returns after 2 months.  She is just started back as a Runner, broadcasting/film/video at the school.  She states is been very difficult and she has a lot of students who disruptive behavior and/or learning disabilities.  She is feels very stressed and exhausted.  She states that she does not have any interest in going to the gym or doing anything else.  She denies having suicidal ideation or severe depression but just seems tired and unmotivated.  I suggested we go up on the Wellbutrin since she is already on the maximum dose of the Effexor XR.  On the positive side she is doing very well with the weight loss. Visit Diagnosis:    ICD-10-CM   1. Moderate single current episode of major depressive disorder (HCC) F32.1 venlafaxine XR (EFFEXOR-XR) 150 MG 24 hr capsule    Past Psychiatric History: none  Past Medical History:  Past Medical History:  Diagnosis Date  . Acne    facial  . Arthritis   . Migraines   . Nicotine addiction   . Obesity     Past Surgical History:  Procedure Laterality Date  . CHOLECYSTECTOMY      Family Psychiatric History: none  Family History:  Family History  Problem Relation Age of Onset  . Hypertension Mother   . Cancer Mother 41       colon  . Obesity Mother   . Hypertension Father   . Diabetes Father   . Hyperlipidemia Father   . Obesity Father     Social History:  Social History   Socioeconomic History  . Marital status: Single    Spouse name: Not on file  . Number of children: 0  . Years of education: Bachelors  . Highest education level: Not on file  Occupational History  . 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 Financial resource strain: Not on file  . Food insecurity:    Worry: Not on file    Inability: Not on file  .  Transportation needs:    Medical: Not on file    Non-medical: Not on file  Tobacco Use  . Smoking status: Former Smoker    Last attempt to quit: 11/13/2008    Years since quitting: 9.1  . Smokeless tobacco: Never Used  . Tobacco comment: Quit 2008, 04-02-2016 per pt stopped 2010  Substance and Sexual Activity  . Alcohol use: No    Alcohol/week: 0.0 standard drinks    Comment: occasional use  a beer once every 3 months or so, 04-02-2016 per pt occas.   . Drug use: No    Comment: 04-02-2016 per pt no  . Sexual activity: Not Currently    Birth control/protection: Pill  Lifestyle  . Physical activity:    Days per week: Not on file    Minutes per session: Not on file  . Stress: Not on file  Relationships  . Social connections:    Talks on phone: Not on file    Gets together: Not on file    Attends religious service: Not on file    Active member of club or organization: Not on file    Attends meetings of clubs or organizations: Not on file    Relationship status: Not on file  Other Topics Concern  . Not on file  Social History Narrative   Lives at home alone.   Right-handed.   Occasional use of caffeine.    Allergies: No Known Allergies  Metabolic Disorder Labs: Lab Results  Component Value Date   HGBA1C 5.2 11/06/2016   MPG 103 11/06/2016   MPG 108 11/06/2015   No results found for: PROLACTIN Lab Results  Component Value Date   CHOL 216 (H) 11/04/2017   TRIG 238 (H) 11/04/2017   HDL 53 11/04/2017   CHOLHDL 4.1 11/04/2017   VLDL 31 (H) 11/06/2016   LDLCALC 125 (H) 11/04/2017   LDLCALC 113 (H) 11/06/2016   Lab Results  Component Value Date   TSH 2.55 11/04/2017   TSH 1.53 11/06/2016    Therapeutic Level Labs: No results found for: LITHIUM No results found for: VALPROATE No components found for:  CBMZ  Current Medications: Current Outpatient Medications  Medication Sig Dispense Refill  . buPROPion (WELLBUTRIN XL) 300 MG 24 hr tablet Take 1 tablet (300 mg  total) by mouth every morning. 30 tablet 2  . Chlorphen-Phenyleph-Ibuprofen (ADVIL ALLERGY & CONGESTION PO) Take by mouth daily.    . Glucosamine HCl (GLUCOSAMINE PO) Take by mouth daily.    Marland Kitchen ibuprofen (ADVIL,MOTRIN) 800 MG tablet TAKE ONE TABLET BY MOUTH TWICE DAILY AS NEEDED FOR SEVERE HEADACHE. TAKE ALONG WITH IMITREX IF NEEDED. 30 tablet 2  . loratadine (CLARITIN) 10 MG  tablet TAKE ONE TABLET BY MOUTH ONCE DAILY 90 tablet 3  . Multiple Vitamin (MULTIVITAMIN) tablet Take 1 tablet by mouth daily.      . Norgestimate-Ethinyl Estradiol Triphasic (TRI-SPRINTEC) 0.18/0.215/0.25 MG-35 MCG tablet Take 1 tablet by mouth daily. 3 Package 3  . rizatriptan (MAXALT-MLT) 10 MG disintegrating tablet Take 1 tablet (10 mg total) by mouth as needed. May repeat in 2 hours if needed 12 tablet 11  . topiramate (TOPAMAX) 100 MG tablet Take one tablet by mouth twice daily.  No further refills until seen. 180 tablet 4  . venlafaxine XR (EFFEXOR-XR) 150 MG 24 hr capsule Take 2 capsules (300 mg total) by mouth daily with breakfast. 90 capsule 2   No current facility-administered medications for this visit.      Musculoskeletal: Strength & Muscle Tone: within normal limits Gait & Station: normal Patient leans: N/A  Psychiatric Specialty Exam: Review of Systems  Constitutional: Positive for malaise/fatigue.  Neurological: Positive for headaches.  All other systems reviewed and are negative.   Blood pressure 110/75, pulse 90, height 5\' 3"  (1.6 m), weight 287 lb (130.2 kg), SpO2 98 %.Body mass index is 50.84 kg/m.  General Appearance: Casual, Neat and Well Groomed  Eye Contact:  Good  Speech:  Clear and Coherent  Volume:  Normal  Mood:  Dysphoric  Affect:  Constricted  Thought Process:  Goal Directed  Orientation:  Full (Time, Place, and Person)  Thought Content: Rumination   Suicidal Thoughts:  No  Homicidal Thoughts:  No  Memory:  Immediate;   Good Recent;   Good Remote;   Good  Judgement:  Good   Insight:  Good  Psychomotor Activity:  Normal  Concentration:  Concentration: Good and Attention Span: Good  Recall:  Good  Fund of Knowledge: Good  Language: Good  Akathisia:  No  Handed:  Right  AIMS (if indicated): not done  Assets:  Communication Skills Desire for Improvement Physical Health Resilience Social Support Talents/Skills  ADL's:  Intact  Cognition: WNL  Sleep:  Good   Screenings: PHQ2-9     Office Visit from [facility]/05/2017 in [facility] Visit from 07/02/2017 in [facility] Visit from 04/23/2016 in [facility] Visit from 10/10/2015 in [facility] from 09/05/2015 in [facility]  PHQ-2 Total Score  2  1  0  2  6  PHQ-9 Total Score  9  6  0  6  15       Assessment and Plan: Patient is a 37 year old female with a history of depression with low motivation and energy.  She recently had a sleep study and does not meet criteria for obstructive sleep apnea.  She does need to get more exercise and movement in her life which would help her energy.  She will increase Wellbutrin XL to 300 mg daily and continue Effexor XR 300 mg daily.  She will continue her counseling and return to see me in 6 weeks   Diannia Rudereborah Ross, MD 01/20/2018, 4:12 PM

## 2018-01-28 ENCOUNTER — Ambulatory Visit (HOSPITAL_COMMUNITY): Payer: BC Managed Care – PPO | Admitting: Psychiatry

## 2018-01-28 ENCOUNTER — Ambulatory Visit: Payer: Self-pay | Admitting: Pulmonary Disease

## 2018-01-28 DIAGNOSIS — F321 Major depressive disorder, single episode, moderate: Secondary | ICD-10-CM | POA: Diagnosis not present

## 2018-01-28 NOTE — Progress Notes (Signed)
                   THERAPIST PROGRESS NOTE      Session Time:  Wednesday 01/28/2018 4:12 PM -  5:10 PM           Participation Level: Active  Behavioral Response: CasualAlert/decreased anxiety  Type of Therapy: Individual Therapy  Treatment Goals addressed:   1. learn and implement behavioral strategies to overcome depression.      2. Learn and implement calming strategies to reduce/manage stress and anxiety.              3. Identify and replace thoughts and beliefs that support depression and anxiety.               Interventions: CBT/Supportive  Summary: Claire [name] is a 37 y.o. female who presents with symptoms of depression that began about a year ago. She began taking antidepressants which helped initially and had to be increased. She recently began to experience low energy and poor motivation again and has been referred for therapy and medication management by PCP Dr. Syliva Overman. 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 Patient reports feeling lilke an outcast in her family as her parents are moving to [place] where patient's brother and sister reside. Patient's current symptoms include excessive sleeping, low energy, poor motivation, feel alone, loss of interest, loss of libido, and depressed mood.  Patient last was seen about 4 weeks ago. She reports increased fatigue and negative thought patterns. She did not implement exercise plan due to fatigue and a migraine. 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 She reports asking for help but says nobody seems to know how to help or they don't want to help. She expresses anger  and frustration. 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 Patient is looking forward to going on vacation with her family next week.  Suicidal/Homicidal: No  Therapist Response:  Reviewed symptoms,discussed stressors, facilitated expression of thoughts and feelings, validated feelings, normalized feelings of sadness related to grief and loss issues, assisted patient in examining her thought patterns about interaction with her class and situation regarding asking others for help, assisted patient identify/challenge/and replace negative unhelpful thoughts with helpful thoughts, reviewed the connection between thoughts/mood/behaviors, assisted patient developed plan to ask [facility] teacher/principal,/an assistant principal for help and suggestions regarding her class,assisted patient develop plan to increase physical activity by walking 30 minutes per day beginning Sunday when she goes on vacation this week  Plan: Return again in 2 weeks  Diagnosis: Axis I: MDD,     Axis II: Deferred    BYNUM,PEGGY, LCSW 01/28/2018

## 2018-02-11 ENCOUNTER — Ambulatory Visit (HOSPITAL_COMMUNITY): Payer: Self-pay | Admitting: Psychiatry

## 2018-02-25 ENCOUNTER — Ambulatory Visit (INDEPENDENT_AMBULATORY_CARE_PROVIDER_SITE_OTHER): Payer: BC Managed Care – PPO | Admitting: Psychiatry

## 2018-02-25 ENCOUNTER — Encounter (HOSPITAL_COMMUNITY): Payer: Self-pay | Admitting: Psychiatry

## 2018-02-25 DIAGNOSIS — F321 Major depressive disorder, single episode, moderate: Secondary | ICD-10-CM

## 2018-02-25 NOTE — Progress Notes (Signed)
                   THERAPIST PROGRESS NOTE      Session Time:  Wednesday 02/25/2018 4:12 PM - 5:09 PM            Participation Level: Active  Behavioral Response: CasualAlert/decreased anxiety  Type of Therapy: Individual Therapy  Treatment Goals addressed:   1. learn and implement behavioral strategies to overcome depression.      2. Learn and implement calming strategies to reduce/manage stress and anxiety.              3. Identify and replace thoughts and beliefs that support depression and anxiety.               Interventions: CBT/Supportive  Summary: Claire [name] is a 37 y.o. female who presents with symptoms of depression that began about a year ago. She began taking antidepressants which helped initially and had to be increased. She recently began to experience low energy and poor motivation again and has been referred for therapy and medication management by PCP Dr. Syliva Overman. 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 Patient reports feeling lilke an outcast in her family as her parents are moving to [place] where patient's brother and sister reside. Patient's current symptoms include excessive sleeping, low energy, poor motivation, feel alone, loss of interest, loss of libido, and depressed mood.  Patient last was seen about 4 weeks ago. She reports continued stress, fatigue, and decreased interest and involvement in activities. 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 She talked with [facility] [employer] [facility] teacher regarding help but had difficulty having assertive communication with teacher. She expresses resentment and frustration regarding working with this Runner, broadcasting/film/video. She has resumed exercise plan and recognized she needed to change her  exercise schedule from evenings to the mornings. She reports this has been helpful but being very tired by the end of the day and falling asleep on the couch. She reports feeling guilty as she has gotten behind on laundry and grocery shopping. She reports she did enjoy family vacation.   Suicidal/Homicidal: No  Therapist Response:  Reviewed symptoms,discussed stressors, facilitated expression of thoughts and feelings, validated feelings, praised and reinforced patient's recognition of changes needing to adjust her exercise schedule and implementing this, assisted patient explore possible causes of fatigue and  identify ways to improve self-care regarding eating patterns and sleep hygiene, assisted patient to develop self-care plan to include eating breakfast/avoid skipping meals, turn off devices and implement bedtime ritual with 8:30 bedtime, also avoid falling asleep on the couch, praised and reinforced patient's use of assertiveness skills in communication with principal, discussed effects on mood/thoughts/behavior, assisted patient examine her interaction with EC teacher and identify pattern of unassertive behavior, discussed effects on mood/thoughts/behavior, reviewed treatment plan  Plan: Return again in 2 weeks  Diagnosis: Axis I: MDD,     Axis II: Deferred    BYNUM,PEGGY, LCSW 02/25/2018

## 2018-03-04 ENCOUNTER — Encounter (HOSPITAL_COMMUNITY): Payer: Self-pay | Admitting: Psychiatry

## 2018-03-04 ENCOUNTER — Ambulatory Visit (HOSPITAL_COMMUNITY): Payer: BC Managed Care – PPO | Admitting: Psychiatry

## 2018-03-04 DIAGNOSIS — F321 Major depressive disorder, single episode, moderate: Secondary | ICD-10-CM | POA: Diagnosis not present

## 2018-03-04 MED ORDER — BUPROPION HCL ER (XL) 300 MG PO TB24: 300.0000 mg | ORAL_TABLET | ORAL | 2 refills | Status: DC

## 2018-03-04 MED ORDER — VENLAFAXINE HCL ER 150 MG PO CP24: 300.0000 mg | ORAL_CAPSULE | Freq: Every day | ORAL | 2 refills | Status: DC

## 2018-03-04 NOTE — Progress Notes (Signed)
BH MD/PA/NP OP Progress Note  03/04/2018 4:30 PM Claire [name]  MRN:  [id number]  Chief Complaint:  Chief Complaint    Depression; Anxiety; Follow-up     HPI: This patient is a 37 year old single white female who lives alone in [place]. 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  The patient was initially referred by Dr. Syliva Overman for further assessment and treatment of depression. She has been seeing Florencia Reasons for several months in our office for counseling.  The patient states that she had one episode of depression previously about 10 years ago. This was during a time in which she was unemployed but she did not receive any specific treatment. She states that she had been doing well until approximately 2 years ago. At this time her sister's child was born and she felt like her parents attention was focused more for herself to the new baby. Her brother is since had a child as well. She is the only one in the family has not married it and does not have children and this makes her feel bad. 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  The patient's depression worsened over time and her primary Dr. put her on Effexor XR and the doses gradually been increased to 150 mg every morning. She states that each time its increase she feels better for about a month and then she feels worse again. Current symptoms include anhedonia, hypersomnia, feeling fatigue and low energy low motivation and occasional crying. She states that she has no interest in sex or dating. She spends most of her wake and sleeping. She talks on the phone to friends but has no interest in getting together. Most of her social life is with her family. She denies suicidal ideation or psychotic symptoms. She does not use drugs or alcohol she's not been the victim of any trauma or abuse either in childhood or as an adult. She  states that she eats too much and has gained quite a bit of weight recently and does not get any exercise her health is good except for chronic migraines which have improved with a combination of Topamax and Maxalt  The patient returns after 6 weeks.  Last time we increased her Effexor XR because she was feeling more depressed with low energy.  She seems to be doing better now.  She is going to the gym before work and has more energy during the day.  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  She is sleeping well most of the time in fact she sometimes falls asleep on the couch which is messing up her nighttime sleep.  She denies suicidal ideation.  She continues to work with Florencia Reasons here in therapy Visit Diagnosis:    ICD-10-CM   1. Moderate single current episode of major depressive disorder (HCC) F32.1 venlafaxine XR (EFFEXOR-XR) 150 MG 24 hr capsule    Past Psychiatric History: none  Past Medical History:  Past Medical History:  Diagnosis Date  . Acne    facial  . Arthritis   . Migraines   . Nicotine addiction   . Obesity     Past Surgical History:  Procedure Laterality Date  . CHOLECYSTECTOMY      Family Psychiatric History: See below  Family History:  Family History  Problem Relation Age of Onset  . Hypertension Mother   . Cancer Mother 70       colon  . Obesity Mother   . Hypertension Father   . Diabetes Father   . Hyperlipidemia Father   . Obesity Father     Social History:  Social History   Socioeconomic History  . Marital status: Single    Spouse name: Not on file  . Number of children: 0  . Years of education: Bachelors  . Highest education level: Not on file  Occupational History  . 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 Financial resource strain: Not on file  . Food insecurity:    Worry: Not on file    Inability: Not on file  . Transportation needs:    Medical: Not on  file    Non-medical: Not on file  Tobacco Use  . Smoking status: Former Smoker    Last attempt to quit: 11/13/2008    Years since quitting: 9.3  . Smokeless tobacco: Never Used  . Tobacco comment: Quit 2008, 04-02-2016 per pt stopped 2010  Substance and Sexual Activity  . Alcohol use: No    Alcohol/week: 0.0 standard drinks    Comment: occasional use  a beer once every 3 months or so, 04-02-2016 per pt occas.   . Drug use: No    Comment: 04-02-2016 per pt no  . Sexual activity: Not Currently    Birth control/protection: Pill  Lifestyle  . Physical activity:    Days per week: Not on file    Minutes per session: Not on file  . Stress: Not on file  Relationships  . Social connections:    Talks on phone: Not on file    Gets together: Not on file    Attends religious service: Not on file    Active member of club or organization: Not on file    Attends meetings of clubs or organizations: Not on file    Relationship status: Not on file  Other Topics Concern  . Not on file  Social History Narrative   Lives at home alone.   Right-handed.   Occasional use of caffeine.    Allergies: No Known Allergies  Metabolic Disorder Labs: Lab Results  Component Value Date   HGBA1C 5.2 11/06/2016   MPG 103 11/06/2016   MPG 108 11/06/2015   No results found for: PROLACTIN Lab Results  Component Value Date   CHOL 216 (H) 11/04/2017   TRIG 238 (H) 11/04/2017   HDL 53 11/04/2017   CHOLHDL 4.1 11/04/2017   VLDL 31 (H) 11/06/2016   LDLCALC 125 (H) 11/04/2017   LDLCALC 113 (H) 11/06/2016   Lab Results  Component Value Date   TSH 2.55 11/04/2017   TSH 1.53 11/06/2016    Therapeutic Level Labs: No results found for: LITHIUM No results found for: VALPROATE No components found for:  CBMZ  Current Medications: Current Outpatient Medications  Medication Sig Dispense Refill  . buPROPion (WELLBUTRIN XL) 300 MG 24 hr tablet Take 1 tablet (300 mg total) by mouth every morning. 30 tablet 2   . Chlorphen-Phenyleph-Ibuprofen (ADVIL ALLERGY & CONGESTION PO) Take by mouth daily.    . Glucosamine HCl (GLUCOSAMINE PO) Take by mouth daily.    Marland Kitchen ibuprofen (ADVIL,MOTRIN) 800 MG tablet TAKE ONE TABLET BY MOUTH TWICE DAILY AS NEEDED FOR SEVERE HEADACHE. TAKE ALONG WITH IMITREX IF NEEDED. 30 tablet 2  . loratadine (CLARITIN) 10 MG tablet TAKE ONE  TABLET BY MOUTH ONCE DAILY 90 tablet 3  . Multiple Vitamin (MULTIVITAMIN) tablet Take 1 tablet by mouth daily.      . Norgestimate-Ethinyl Estradiol Triphasic (TRI-SPRINTEC) 0.18/0.215/0.25 MG-35 MCG tablet Take 1 tablet by mouth daily. 3 Package 3  . rizatriptan (MAXALT-MLT) 10 MG disintegrating tablet Take 1 tablet (10 mg total) by mouth as needed. May repeat in 2 hours if needed 12 tablet 11  . topiramate (TOPAMAX) 100 MG tablet Take one tablet by mouth twice daily.  No further refills until seen. 180 tablet 4  . venlafaxine XR (EFFEXOR-XR) 150 MG 24 hr capsule Take 2 capsules (300 mg total) by mouth daily with breakfast. 90 capsule 2   No current facility-administered medications for this visit.      Musculoskeletal: Strength & Muscle Tone: within normal limits Gait & Station: normal Patient leans: N/A  Psychiatric Specialty Exam: Review of Systems  All other systems reviewed and are negative.   Blood pressure 132/76, pulse 82, height 5\' 3"  (1.6 m), weight 291 lb (132 kg), SpO2 99 %.Body mass index is 51.55 kg/m.  General Appearance: Casual and Fairly Groomed  Eye Contact:  Good  Speech:  Clear and Coherent  Volume:  Normal  Mood:  Euthymic  Affect:  Congruent  Thought Process:  Goal Directed  Orientation:  Full (Time, Place, and Person)  Thought Content: WDL   Suicidal Thoughts:  No  Homicidal Thoughts:  No  Memory:  Immediate;   Good Recent;   Good Remote;   Good  Judgement:  Good  Insight:  Good  Psychomotor Activity:  Normal  Concentration:  Concentration: Good and Attention Span: Good  Recall:  Good  Fund of Knowledge:  Good  Language: Good  Akathisia:  No  Handed:  Right  AIMS (if indicated): not done  Assets:  Communication Skills Desire for Improvement Physical Health Resilience Social Support Talents/Skills Vocational/Educational  ADL's:  Intact  Cognition: WNL  Sleep:  Good   Screenings: PHQ2-9     [facility] Visit from 11/05/2017 in [facility] Office Visit from 07/02/2017 in [facility] Office Visit from 04/23/2016 in [facility] Office Visit from 10/10/2015 in [facility]  PHQ-2 Total Score  2  2  1   0  2  PHQ-9 Total Score  9  9  6   0  6       Assessment and Plan: This patient is a 37 year old female with a history of depression and anxiety.  She seems to be doing better.  She will continue Effexor XR 300 mg every morning as well as Wellbutrin XL 300 mg every morning both for depression.  She is continue to work on her diet and exercise and weight loss.  She will return to see me in 3 months   Diannia Ruder, MD 03/04/2018, 4:30 PM

## 2018-03-06 ENCOUNTER — Encounter: Payer: Self-pay | Admitting: Family Medicine

## 2018-03-09 ENCOUNTER — Other Ambulatory Visit: Payer: Self-pay | Admitting: Family Medicine

## 2018-03-09 MED ORDER — PHENTERMINE HCL 37.5 MG PO TABS: ORAL_TABLET | ORAL | 2 refills | Status: DC

## 2018-03-09 NOTE — Progress Notes (Signed)
Phentermine

## 2018-03-11 ENCOUNTER — Ambulatory Visit (HOSPITAL_COMMUNITY): Payer: BC Managed Care – PPO | Admitting: Psychiatry

## 2018-03-11 DIAGNOSIS — F321 Major depressive disorder, single episode, moderate: Secondary | ICD-10-CM

## 2018-03-11 NOTE — Progress Notes (Signed)
                   THERAPIST PROGRESS NOTE      Session Time:  Wednesday 03/11/2018 4:17 PM -  4:57 PM                   Participation Level: Active  Behavioral Response: CasualAlert/decreased anxiety/improved mood  Type of Therapy: Individual Therapy  Treatment Goals addressed:   1. learn and implement behavioral strategies to overcome depression.      2. Learn and implement calming strategies to reduce/manage stress and anxiety.              3. Identify and replace thoughts and beliefs that support depression and anxiety.               Interventions: CBT/Supportive  Summary: Claire GroveSheila M [name] is a 37 y.o. female who presents with symptoms of depression that began about a year ago. She began taking antidepressants which helped initially and had to be increased. She recently began to experience low energy and poor motivation again and has been referred for therapy and medication management by PCP Dr. Syliva OvermanMargaret Simpson. 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 Patient reports feeling lilke an outcast in her family as [place]'s [place] and sister reside. Patient's current symptoms include excessive sleeping, low energy, poor motivation, feel alone, loss of interest, loss of libido, and depressed mood.  Patient last was seen about 2 weeks ago. She reports decreased stress, improved mood, increased energy, and increased interest and involvement in activities. She reports exercising at gym 3-4 mornings per week and improved eating patterns (avoiding skipping meals and making healthy food choices). She also reports improved sleep hygiene and sleep patterns. She has avoided falling asleep on the couch and has   Implemented bedtime ritual. She states going to bed between 8:30 and 9:00. She  reports difficulty sleeping some nights and attributes this to worry about students' performance on state tests. She also reports continued stress regarding working with [employer] and pattern of nonassertive behavior with Runner, broadcasting/film/videoteacher. Patient reports socialization with family but expresses disappointment regarding family not celebrating her birthday in the manner family usually does. She reports she did say something to them about this but admits being passive in her communication. Suicidal/Homicidal: No  Therapist Response:  Reviewed symptoms, praise and reinforced patient's consistent exercise/improved eating patterns/and improved sleep hygiene and sleep patterns, discussed effects, encouraged patient to maintain consistency regarding self-care efforts,discussed stressors, facilitated expression of thoughts and feelings, validated feelings, discussed the rationale for and practice mindfulness technique using breath awareness to cope with ruminating thoughts regarding students performance, assisted patient to begin to identify thoughts and processes that inhibit effective assertion, provided patient with handout on assertive communication and asked her to review for next session  Plan: Return again in 2 weeks  Diagnosis: Axis I: MDD,     Axis II: Deferred    Barbarajean Kinzler, LCSW 03/11/2018

## 2018-03-18 ENCOUNTER — Encounter: Payer: Self-pay | Admitting: Family Medicine

## 2018-03-18 ENCOUNTER — Ambulatory Visit (INDEPENDENT_AMBULATORY_CARE_PROVIDER_SITE_OTHER): Payer: BC Managed Care – PPO | Admitting: Family Medicine

## 2018-03-18 VITALS — BP 110/78 | HR 84 | Resp 14 | Ht 63.0 in | Wt 287.1 lb

## 2018-03-18 DIAGNOSIS — G44009 Cluster headache syndrome, unspecified, not intractable: Secondary | ICD-10-CM

## 2018-03-18 DIAGNOSIS — J209 Acute bronchitis, unspecified: Secondary | ICD-10-CM | POA: Diagnosis not present

## 2018-03-18 DIAGNOSIS — G4730 Sleep apnea, unspecified: Secondary | ICD-10-CM

## 2018-03-18 DIAGNOSIS — J0100 Acute maxillary sinusitis, unspecified: Secondary | ICD-10-CM | POA: Diagnosis not present

## 2018-03-18 MED ORDER — SULFAMETHOXAZOLE-TRIMETHOPRIM 800-160 MG PO TABS: 1.0000 | ORAL_TABLET | Freq: Two times a day (BID) | ORAL | 0 refills | Status: DC

## 2018-03-18 MED ORDER — FLUCONAZOLE 150 MG PO TABS: ORAL_TABLET | ORAL | 0 refills | Status: DC

## 2018-03-18 MED ORDER — PROMETHAZINE-DM 6.25-15 MG/5ML PO SYRP: ORAL_SOLUTION | ORAL | 0 refills | Status: DC

## 2018-03-18 MED ORDER — BENZONATATE 100 MG PO CAPS: 100.0000 mg | ORAL_CAPSULE | Freq: Two times a day (BID) | ORAL | 0 refills | Status: DC | PRN

## 2018-03-18 NOTE — Patient Instructions (Addendum)
Please cancel December appointment , and reschedule to first week in February  You are treated for acute sinusitis and bronchitis  Rest, fluids, antibiotic and decongestant please fill today \\Cough  suppressant and fluconazole are also prescribed   Work excuse from today , return 03/20/2018   Acute Bronchitis, Adult Acute bronchitis is when air tubes (bronchi) in the lungs suddenly get swollen. The condition can make it hard to breathe. It can also cause these symptoms:  A cough.  Coughing up clear, yellow, or green mucus.  Wheezing.  Chest congestion.  Shortness of breath.  A fever.  Body aches.  Chills.  A sore throat.  Follow these instructions at home: Medicines  Take over-the-counter and prescription medicines only as told by your doctor.  If you were prescribed an antibiotic medicine, take it as told by your doctor. Do not stop taking the antibiotic even if you start to feel better. General instructions  Rest.  Drink enough fluids to keep your pee (urine) clear or pale yellow.  Avoid smoking and secondhand smoke. If you smoke and you need help quitting, ask your doctor. Quitting will help your lungs heal faster.  Use an inhaler, cool mist vaporizer, or humidifier as told by your doctor.  Keep all follow-up visits as told by your doctor. This is important. How is this prevented? To lower your risk of getting this condition again:  Wash your hands often with soap and water. If you cannot use soap and water, use hand sanitizer.  Avoid contact with people who have cold symptoms.  Try not to touch your hands to your mouth, nose, or eyes.  Make sure to get the flu shot every year.  Contact a doctor if:  Your symptoms do not get better in 2 weeks. Get help right away if:  You cough up blood.  You have chest pain.  You have very bad shortness of breath.  You become dehydrated.  You faint (pass out) or keep feeling like you are going to pass  out.  You keep throwing up (vomiting).  You have a very bad headache.  Your fever or chills gets worse. This information is not intended to replace advice given to you by your health care provider. Make sure you discuss any questions you have with your health care provider. Document Released: 10/02/2007 Document Revised: 11/22/2015 Document Reviewed: 10/04/2015 Elsevier Interactive Patient Education  2018 [employer]  Sinusitis, Adult Sinusitis is soreness and inflammation of your sinuses. Sinuses are hollow spaces in the bones around your face. They are located:  Around your eyes.  In the middle of your forehead.  Behind your nose.  In your cheekbones.  Your sinuses and nasal passages are lined with a stringy fluid (mucus). Mucus normally drains out of your sinuses. When your nasal tissues get inflamed or swollen, the mucus can get trapped or blocked so air cannot flow through your sinuses. This lets bacteria, viruses, and funguses grow, and that leads to infection. Follow these instructions at home: Medicines  Take, use, or apply over-the-counter and prescription medicines only as told by your doctor. These may include nasal sprays.  If you were prescribed an antibiotic medicine, take it as told by your doctor. Do not stop taking the antibiotic even if you start to feel better. Hydrate and Humidify  Drink enough water to keep your pee (urine) clear or pale yellow.  Use a cool mist humidifier to keep the humidity level in your home above 50%.  Breathe in steam  for 10-15 minutes, 3-4 times a day or as told by your doctor. You can do this in the bathroom while a hot shower is running.  Try not to spend time in cool or dry air. Rest  Rest as much as possible.  Sleep with your head raised (elevated).  Make sure to get enough sleep each night. General instructions  Put a warm, moist washcloth on your face 3-4 times a day or as told by your doctor. This will help with  discomfort.  Wash your hands often with soap and water. If there is no soap and water, use hand sanitizer.  Do not smoke. Avoid being around people who are smoking (secondhand smoke).  Keep all follow-up visits as told by your doctor. This is important. Contact a doctor if:  You have a fever.  Your symptoms get worse.  Your symptoms do not get better within 10 days. Get help right away if:  You have a very bad headache.  You cannot stop throwing up (vomiting).  You have pain or swelling around your face or eyes.  You have trouble seeing.  You feel confused.  Your neck is stiff.  You have trouble breathing. This information is not intended to replace advice given to you by your health care provider. Make sure you discuss any questions you have with your health care provider. Document Released: 10/02/2007 Document Revised: 12/10/2015 Document Reviewed: 02/08/2015 Elsevier Interactive Patient Education  [employer].

## 2018-03-19 ENCOUNTER — Telehealth: Payer: Self-pay | Admitting: Family Medicine

## 2018-03-19 ENCOUNTER — Encounter: Payer: Self-pay | Admitting: Family Medicine

## 2018-03-19 NOTE — Telephone Encounter (Signed)
pls re write a new work excuse for this patient for her to return to work on 03/23/2018. Pls see the message she sent, and communicate with her to have this completed, thanks

## 2018-03-19 NOTE — Telephone Encounter (Signed)
Work note extended. Patient notified

## 2018-03-25 ENCOUNTER — Ambulatory Visit (HOSPITAL_COMMUNITY): Payer: Self-pay | Admitting: Psychiatry

## 2018-03-28 NOTE — Assessment & Plan Note (Signed)
Antibiotic course prescribed, work excuse for acute illness , also encouraged to start saline nasal flushes

## 2018-03-28 NOTE — Assessment & Plan Note (Signed)
Controlled currently.  

## 2018-03-28 NOTE — Assessment & Plan Note (Signed)
Antibiotic and decongestant and work excuse ,also radiologic studies

## 2018-03-28 NOTE — Assessment & Plan Note (Signed)
Deteriorated. Patient re-educated about  the importance of commitment to a  minimum of 150 minutes of exercise per week.  The importance of healthy food choices with portion control discussed. Encouraged to start a food diary, count calories and to consider  joining a support group. Sample diet sheets offered. Goals set by the patient for the next several months.   Weight /BMI 03/18/2018 12/04/2017 11/05/2017  WEIGHT 287 lb 1.3 oz 284 lb 294 lb  HEIGHT 5\' 3"  5\' 3"  5\' 3"   BMI 50.85 kg/m2 50.31 kg/m2 52.08 kg/m2  Some encounter information is confidential and restricted. Go to Review Flowsheets activity to see all data.

## 2018-03-28 NOTE — Progress Notes (Signed)
   Alvia GroveSheila M Mervine     MRN: [id number]      DOB: 08/16/1980   HPI Ms. Claire [name] is here with a 5 day h/o worsening cough, chest congestion and loss of voice, currently coughing up thick green mucus and having chills, also c/o sore throat. 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   Denies dysuria, frequency, hesitancy or incontinence. Denies joint pain, swelling and limitation in mobility. Denies headaches, seizures, numbness, or tingling. Denies depression, anxiety or insomnia. Denies skin break down or rash.   PE  BP 110/78   Pulse 84   Resp 14   Ht 5\' 3"  (1.6 m)   Wt 287 lb 1.3 oz (130.2 kg)   SpO2 98% Comment: room air  BMI 50.85 kg/m   Patient alert and oriented and in no cardiopulmonary distress.Ill appearing  HEENT: No facial asymmetry, EOMI,   oropharynx pink and moist.  Neck supple no JVD, bilateral anterior cervical adenitis , maxillary sinus tenderness, TM clear.  Chest: decreased air entry , scattered crackles and wheezes  CVS: S1, S2 no murmurs, no S3.Regular rate.  ABD: Soft non tender.   Ext: No edema  MS: Adequate ROM spine, shoulders, hips and knees.  Skin: Intact, no ulcerations or rash noted.  Psych: Good eye contact, normal affect. Memory intact not anxious or depressed appearing.  CNS: CN 2-12 intact, power,  normal throughout.no focal deficits noted.   Assessment & Plan Sinusitis, acute maxillary Antibiotic course prescribed, work excuse for acute illness , also encouraged to start saline nasal flushes  Morbid obesity Deteriorated. Patient re-educated about  the importance of commitment to a  minimum of 150 minutes of exercise per week.    The importance of healthy food choices with portion control discussed. Encouraged to start a food diary, count calories and to consider  joining a support group. Sample diet sheets offered. Goals set by the patient for the next several months.   Weight /BMI 03/18/2018 12/04/2017 11/05/2017  WEIGHT 287 lb 1.3 oz 284 lb 294 lb  HEIGHT 5\' 3"  5\' 3"  5\' 3"   BMI 50.85 kg/m2 50.31 kg/m2 52.08 kg/m2  Some encounter information is confidential and restricted. Go to Review Flowsheets activity to see all data.      Migraine-cluster headache syndrome Controlled currently  Acute bronchitis Antibiotic and decongestant and work excuse ,also radiologic studies

## 2018-04-07 ENCOUNTER — Ambulatory Visit: Payer: Self-pay | Admitting: Family Medicine

## 2018-04-28 ENCOUNTER — Ambulatory Visit (HOSPITAL_COMMUNITY): Payer: BC Managed Care – PPO | Admitting: Psychiatry

## 2018-05-14 ENCOUNTER — Ambulatory Visit (HOSPITAL_COMMUNITY): Payer: BC Managed Care – PPO | Admitting: Psychiatry

## 2018-05-14 ENCOUNTER — Encounter (HOSPITAL_COMMUNITY): Payer: Self-pay | Admitting: Psychiatry

## 2018-05-14 DIAGNOSIS — F321 Major depressive disorder, single episode, moderate: Secondary | ICD-10-CM | POA: Diagnosis not present
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
## 2018-05-17 ENCOUNTER — Other Ambulatory Visit: Payer: Self-pay | Admitting: Neurology

## 2018-05-22 ENCOUNTER — Other Ambulatory Visit: Payer: Self-pay | Admitting: Neurology

## 2018-05-22 ENCOUNTER — Other Ambulatory Visit: Payer: Self-pay | Admitting: Family Medicine

## 2018-06-01 ENCOUNTER — Encounter: Payer: Self-pay | Admitting: Family Medicine

## 2018-06-01 ENCOUNTER — Telehealth: Payer: Self-pay

## 2018-06-01 NOTE — Telephone Encounter (Signed)
Spoke with patient regarding her Topamax refill, advised her to contact her neurologist for refills. She is aware and agrees

## 2018-06-03 ENCOUNTER — Ambulatory Visit (HOSPITAL_COMMUNITY): Payer: BC Managed Care – PPO | Admitting: Psychiatry

## 2018-06-03 ENCOUNTER — Other Ambulatory Visit: Payer: Self-pay | Admitting: Neurology

## 2018-06-04 ENCOUNTER — Ambulatory Visit: Payer: BC Managed Care – PPO | Admitting: Neurology

## 2018-06-04 ENCOUNTER — Ambulatory Visit (HOSPITAL_COMMUNITY): Payer: BC Managed Care – PPO | Admitting: Psychiatry

## 2018-06-04 ENCOUNTER — Encounter: Payer: Self-pay | Admitting: Neurology

## 2018-06-04 VITALS — BP 122/81 | HR 79 | Ht 63.0 in | Wt 294.5 lb

## 2018-06-04 DIAGNOSIS — G43709 Chronic migraine without aura, not intractable, without status migrainosus: Secondary | ICD-10-CM | POA: Diagnosis not present

## 2018-06-04 DIAGNOSIS — IMO0002 Reserved for concepts with insufficient information to code with codable children: Secondary | ICD-10-CM

## 2018-06-04 MED ORDER — TOPIRAMATE 100 MG PO TABS: ORAL_TABLET | ORAL | 4 refills | Status: DC

## 2018-06-04 MED ORDER — RIZATRIPTAN BENZOATE 10 MG PO TBDP: 10.0000 mg | ORAL_TABLET | ORAL | 11 refills | Status: DC | PRN

## 2018-06-04 NOTE — Progress Notes (Signed)
PATIENT: Claire [name] DOB: 02/28/1981  Chief Complaint  Patient presents with  . Migraine    Last seen 02/13/17.  She has not been on topriamate 100mg  BID for the last month.  When she is on the medication, she usually only gets one migraine per month around her menstrual cycle.  Maxalt works well for her rescue medication.      HISTORICAL  Claire [name] is a 38 years old right-handed female, accompanied by her mother Lupita Leash, seen in refer by her primary care doctor Kerri Perches for evaluation of frequent migraine headaches  She had a history of obesity, migraine since 2014, currently work as a [employer] officer.  She used to have migraine headache about once a month, right retro-orbital area severe pounding headache with associated light noise sensitivity, movement made it worse, sleep in dark quiet room was helpful, she has been taking Imitrex 100 mg with suboptimal control, often have to take it together with 800 mg of ibuprofen.  Since March 2017, she had increased migraine headaches, about once a month,  Trigger for her migraines are stress, sleep deprivation, weather change, menstruation.  She denies visual loss, denies significant side effect with Topamax, she has been taking Topamax 50 mg twice a day since 2016, which has helped her headaches some. she reported a weight gain of 15 pounds since 2016,  UPDATE Feb 13 2017: Her headache is under excellent control taking Topamax 100 mg 2 tablets every night as migraine prevention, Maxalt as needed works well, she usually gets headache around her menstruation,  UPDATE Jun 04 2018: While she was taking Topamax 100 mg daily, she is having headaches once a month, without Topamax, she has more frequent headaches once a week, Maxalt works well  REVIEW OF SYSTEMS: Full 14 system review of systems performed and notable only for As above  All rest review of the systems were negative   ALLERGIES: No Known Allergies  HOME  MEDICATIONS: Current Outpatient Medications  Medication Sig Dispense Refill  . buPROPion (WELLBUTRIN XL) 300 MG 24 hr tablet Take 1 tablet (300 mg total) by mouth every morning. 30 tablet 2  . Chlorphen-Phenyleph-Ibuprofen (ADVIL ALLERGY & CONGESTION PO) Take by mouth daily.    . Glucosamine HCl (GLUCOSAMINE PO) Take by mouth daily.    Marland Kitchen ibuprofen (ADVIL,MOTRIN) 800 MG tablet TAKE 1 TABLET BY MOUTH TWICE DAILY AS NEEDED FOR  SEVERE  HEADACHE.  TAKE  ALONG  WITH  IMITREX  IF  NEEDED. 90 tablet 0  . loratadine (CLARITIN) 10 MG tablet TAKE ONE TABLET BY MOUTH ONCE DAILY 90 tablet 3  . Multiple Vitamin (MULTIVITAMIN) tablet Take 1 tablet by mouth daily.      . Norgestimate-Ethinyl Estradiol Triphasic (TRI-SPRINTEC) 0.18/0.215/0.25 MG-35 MCG tablet Take 1 tablet by mouth daily. 3 Package 3  . phentermine (ADIPEX-P) 37.5 MG tablet Take Half tablet once daily before brfeakfast 15 tablet 2  . rizatriptan (MAXALT-MLT) 10 MG disintegrating tablet Take 1 tablet (10 mg total) by mouth as needed. May repeat in 2 hours if needed 12 tablet 11  . topiramate (TOPAMAX) 100 MG tablet Take one tablet by mouth twice daily.  No further refills until seen. 180 tablet 4  . venlafaxine XR (EFFEXOR-XR) 150 MG 24 hr capsule Take 2 capsules (300 mg total) by mouth daily with breakfast. 90 capsule 2   No current facility-administered medications for this visit.     PAST MEDICAL HISTORY: Past Medical History:  Diagnosis Date  .  Acne    facial  . Arthritis   . Migraines   . Nicotine addiction   . Obesity     PAST SURGICAL HISTORY: Past Surgical History:  Procedure Laterality Date  . CHOLECYSTECTOMY      FAMILY HISTORY: Family History  Problem Relation Age of Onset  . Hypertension Mother   . Cancer Mother 7250       colon  . Obesity Mother   . Hypertension Father   . Diabetes Father   . Hyperlipidemia Father   . Obesity Father     SOCIAL HISTORY:  Social History   Socioeconomic History  . Marital  status: Single    Spouse name: Not on file  . Number of children: 0  . Years of education: Bachelors  . Highest education level: Not on file  Occupational History  . 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 Financial resource strain: Not on file  . Food insecurity:    Worry: Not on file    Inability: Not on file  . Transportation needs:    Medical: Not on file    Non-medical: Not on file  Tobacco Use  . Smoking status: Former Smoker    Last attempt to quit: 11/13/2008    Years since quitting: 9.5  . Smokeless tobacco: Never Used  . Tobacco comment: Quit 2008, 04-02-2016 per pt stopped 2010  Substance and Sexual Activity  . Alcohol use: No    Alcohol/week: 0.0 standard drinks    Comment: occasional use  a beer once every 3 months or so, 04-02-2016 per pt occas.   . Drug use: No    Comment: 04-02-2016 per pt no  . Sexual activity: Not Currently    Birth control/protection: Pill  Lifestyle  . Physical activity:    Days per week: Not on file    Minutes per session: Not on file  . Stress: Not on file  Relationships  . Social connections:    Talks on phone: Not on file    Gets together: Not on file    Attends religious service: Not on file    Active member of club or organization: Not on file    Attends meetings of clubs or organizations: Not on file    Relationship status: Not on file  . Intimate partner violence:    Fear of current or ex partner: Not on file    Emotionally abused: Not on file    Physically abused: Not on file    Forced sexual activity: Not on file  Other Topics Concern  . Not on file  Social History Narrative   Lives at home alone.   Right-handed.   Occasional use of caffeine.     PHYSICAL EXAM   Vitals:   06/04/18 1241  BP: 122/81  Pulse: 79  Weight: 294 lb 8 oz (133.6 kg)  Height: 5\' 3"  (1.6 m)    Not recorded      Body mass index is 52.17 kg/m.  PHYSICAL EXAMNIATION:  Gen: NAD, conversant, well nourised, obese, well groomed                      Cardiovascular: Regular rate rhythm, no peripheral edema, warm, nontender. Eyes: Conjunctivae clear without exudates or hemorrhage Neck: Supple, no carotid bruise. Pulmonary: Clear to auscultation bilaterally   NEUROLOGICAL EXAM:  MENTAL STATUS: Speech:    Speech is normal; fluent and spontaneous with normal comprehension.  Cognition:     Orientation to  time, place and person     Normal recent and remote memory     Normal Attention span and concentration     Normal Language, naming, repeating,spontaneous speech     Fund of knowledge   CRANIAL NERVES: CN II: Visual fields are full to confrontation. Fundoscopic exam is normal with sharp discs and no vascular changes. Pupils are round equal and briskly reactive to light. CN III, IV, VI: extraocular movement are normal. No ptosis. CN V: Facial sensation is intact to pinprick in all 3 divisions bilaterally. Corneal responses are intact.  CN VII: Face is symmetric with normal eye closure and smile. CN VIII: Hearing is normal to rubbing fingers CN IX, X: Palate elevates symmetrically. Phonation is normal. CN XI: Head turning and shoulder shrug are intact CN XII: Tongue is midline with normal movements and no atrophy.  MOTOR: There is no pronator drift of out-stretched arms. Muscle bulk and tone are normal. Muscle strength is normal.  REFLEXES: Reflexes are 2+ and symmetric at the biceps, triceps, knees, and ankles. Plantar responses are flexor.  SENSORY: Intact to light touch, pinprick, positional sensation and vibratory sensation are intact in fingers and toes.  COORDINATION: Rapid alternating movements and fine finger movements are intact. There is no dysmetria on finger-to-nose and heel-knee-shin.    GAIT/STANCE: Posture is normal. Gait is steady with normal steps, base, arm swing, and turning. Heel and toe walking are normal. Tandem gait is normal.  Romberg is absent.   DIAGNOSTIC DATA (LABS, IMAGING,  TESTING) - I reviewed patient records, labs, notes, testing and imaging myself where available.   ASSESSMENT AND PLAN  EVERETTA SHARRY is a 38 y.o. female   Chronic migraine Obesity  Continue Topamax 100 mg 2 tablets every night  Maxalt as needed  Continue follow-up with her primary care   Levert Feinstein, M.D. Ph.D.  [facility] [address], [address] [place], [place] [address] Ph: [phone] Fax: [phone]  CC: Kerri Perches, MD

## 2018-06-18 ENCOUNTER — Ambulatory Visit (HOSPITAL_COMMUNITY): Payer: BC Managed Care – PPO | Admitting: Psychiatry

## 2018-06-25 ENCOUNTER — Ambulatory Visit: Payer: Self-pay | Admitting: Family Medicine

## 2018-07-01 ENCOUNTER — Encounter: Payer: Self-pay | Admitting: Family Medicine

## 2018-07-03 ENCOUNTER — Telehealth: Payer: Self-pay | Admitting: Family Medicine

## 2018-07-03 NOTE — Telephone Encounter (Signed)
-----   Message from Kerri Perches, MD sent at 07/03/2018  8:38 AM EST ----- Regarding: plks see pt msg, I advised if she choses to wait to sched for Mond bween 8 and 8 40 am, pls reach out to her directly I also advised urgent care since a lot of symptoms so if she choses that option that is FINE

## 2018-07-03 NOTE — Telephone Encounter (Signed)
Reached out to the Pt via cell phone, with no luck. Sent the PT a My chart msg.

## 2018-07-06 ENCOUNTER — Encounter: Payer: Self-pay | Admitting: Family Medicine

## 2018-07-06 ENCOUNTER — Ambulatory Visit: Payer: BC Managed Care – PPO | Admitting: Family Medicine

## 2018-07-06 VITALS — BP 106/78 | HR 99 | Resp 14 | Ht 63.0 in | Wt 298.1 lb

## 2018-07-06 DIAGNOSIS — J0110 Acute frontal sinusitis, unspecified: Secondary | ICD-10-CM

## 2018-07-06 DIAGNOSIS — E785 Hyperlipidemia, unspecified: Secondary | ICD-10-CM | POA: Diagnosis not present

## 2018-07-06 DIAGNOSIS — F321 Major depressive disorder, single episode, moderate: Secondary | ICD-10-CM

## 2018-07-06 DIAGNOSIS — E559 Vitamin D deficiency, unspecified: Secondary | ICD-10-CM

## 2018-07-06 MED ORDER — SULFAMETHOXAZOLE-TRIMETHOPRIM 800-160 MG PO TABS: 1.0000 | ORAL_TABLET | Freq: Two times a day (BID) | ORAL | 0 refills | Status: DC

## 2018-07-06 MED ORDER — NORGESTIM-ETH ESTRAD TRIPHASIC 0.18/0.215/0.25 MG-35 MCG PO TABS: 1.0000 | ORAL_TABLET | Freq: Every day | ORAL | 3 refills | Status: DC

## 2018-07-06 NOTE — Assessment & Plan Note (Signed)
Deteriorated.  Patient re-educated about  the importance of commitment to a  minimum of 150 minutes of exercise per week as able.  The importance of healthy food choices with portion control discussed, as well as eating regularly and within a 12 hour window most days. The need to choose "clean , green" food 50 to 75% of the time is discussed, as well as to make water the primary drink and set a goal of 64 ounces water daily.  Encouraged to start a food diary,  and to consider  joining a support group. Sample diet sheets offered. Goals set by the patient for the next several months.   Weight /BMI 07/06/2018 06/04/2018 03/18/2018  WEIGHT 298 lb 1.3 oz 294 lb 8 oz 287 lb 1.3 oz  HEIGHT 5\' 3"  5\' 3"  5\' 3"   BMI 52.8 kg/m2 52.17 kg/m2 50.85 kg/m2  Some encounter information is confidential and restricted. Go to Review Flowsheets activity to see all data.   Discussed with pt serious consideration for bariatric surgery

## 2018-07-06 NOTE — Progress Notes (Signed)
   Claire [name]     MRN: [id number]      DOB: 05/10/1980   HPI Ms. Claire [name] is here for follow up and re-evaluation of chronic medical conditions, medication management and review of any available recent lab and radiology data.  Preventive health is updated, specifically  Cancer screening and Immunization.   Questions or concerns regarding consultations or procedures which the PT has had in the interim are  addressed. The PT denies any adverse reactions to current medications since the last visit.  1 week h/o frontal pressure with green drinage post nasal, followed by hoarseness, sore throat and cough which is not productive, no fever, few chills.Ears itch C/o uncontrolled depression, not suicidal or homicidal, demoptivated, gaining weight , scant finances  ROS  Denies chest pains, palpitations and leg swelling Denies abdominal pain, nausea, vomiting,diarrhea or constipation.   Denies dysuria, frequency, hesitancy or incontinence. Denies joint pain, swelling and limitation in mobility. Denies headaches, seizures, numbness, or tingling. Denies skin break down or rash.   PE  BP 106/78   Pulse 99   Resp 14   Ht 5\' 3"  (1.6 m)   Wt 298 lb 1.3 oz (135.2 kg)   SpO2 97% Comment: room air  BMI 52.80 kg/m   Patient alert and oriented and in no cardiopulmonary distress.  HEENT: No facial asymmetry, EOMI,   oropharynx pink and moist.  Neck supple no JVD, no mass.Frontal sinius pressure, no cervical adenopathy, TM clear bilaterally  Chest: Clear to auscultation bilaterally.  CVS: S1, S2 no murmurs, no S3.Regular rate.  ABD: Soft non tender.   Ext: No edema  MS: Adequate ROM spine, shoulders, hips and knees.  Skin: Intact, no ulcerations or rash noted.  Psych: Good eye contact, flat  affect. Memory intact not anxious but very  depressed appearing.  CNS: CN 2-12 intact, power,  normal throughout.no focal deficits noted.   Assessment & Plan  Acute frontal sinusitis Septra x 1 week  and saline nasal flushes  Depression Message to Psych re sooner visit  And  Med up titration, pt not suicidal or homicidal  Morbid obesity Deteriorated.  Patient re-educated about  the importance of commitment to a  minimum of 150 minutes of exercise per week as able.  The importance of healthy food choices with portion control discussed, as well as eating regularly and within a 12 hour window most days. The need to choose "clean , green" food 50 to 75% of the time is discussed, as well as to make water the primary drink and set a goal of 64 ounces water daily.  Encouraged to start a food diary,  and to consider  joining a support group. Sample diet sheets offered. Goals set by the patient for the next several months.   Weight /BMI 07/06/2018 06/04/2018 03/18/2018  WEIGHT 298 lb 1.3 oz 294 lb 8 oz 287 lb 1.3 oz  HEIGHT 5\' 3"  5\' 3"  5\' 3"   BMI 52.8 kg/m2 52.17 kg/m2 50.85 kg/m2  Some encounter information is confidential and restricted. Go to Review Flowsheets activity to see all data.   Discussed with pt serious consideration for bariatric surgery

## 2018-07-06 NOTE — Assessment & Plan Note (Signed)
Septra x 1 week and saline nasal flushes

## 2018-07-06 NOTE — Patient Instructions (Addendum)
Please cancel March appt and reschedule physical with pap August 8 or after  Call if you need me before  1 week of antibiotic is prescribed for sinus infection  Dr Bing Matter office will be in touch and so will I as soon as I hear from her  Try to commit to 15 minutes during school day, and to reduce candy/ sugar and increase vegetables and fruit  It is important that you exercise regularly at least 30 minutes 5 times a week. If you develop chest pain, have severe difficulty breathing, or feel very tired, stop exercising immediately and seek medical attention    Thanks for choosing [facility], we consider it a privelige to serve you.

## 2018-07-06 NOTE — Assessment & Plan Note (Signed)
Message to Psych re sooner visit  And  Med up titration, pt not suicidal or homicidal

## 2018-07-09 ENCOUNTER — Ambulatory Visit (INDEPENDENT_AMBULATORY_CARE_PROVIDER_SITE_OTHER): Payer: BC Managed Care – PPO | Admitting: Psychiatry

## 2018-07-09 ENCOUNTER — Other Ambulatory Visit: Payer: Self-pay

## 2018-07-09 DIAGNOSIS — Z566 Other physical and mental strain related to work: Secondary | ICD-10-CM

## 2018-07-09 DIAGNOSIS — F321 Major depressive disorder, single episode, moderate: Secondary | ICD-10-CM

## 2018-07-09 DIAGNOSIS — Z564 Discord with boss and workmates: Secondary | ICD-10-CM

## 2018-07-09 NOTE — Progress Notes (Signed)
                   THERAPIST PROGRESS NOTE      Session Time:  Thursday 07/09/2018 4:15 PM -  5:15 PM                         Participation Level: Active  Behavi  oral Response: CasualAlert/depressed/anxious  Type of Therapy: Individual Therapy  Treatment Goals addressed:   1. learn and implement behavioral strategies to overcome depression.      2. Learn and implement calming strategies to reduce/manage stress and anxiety.              3. Identify and replace thoughts and beliefs that support depression and anxiety.               Interventions: CBT/Supportive  Summary: Claire [name] is a 38 y.o. female who presents with symptoms of depression that began about a year ago. She began taking antidepressants which helped initially and had to be increased. She recently began to experience low energy and poor motivation again and has been referred for therapy and medication management by PCP Dr. Syliva Overman. 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 Patient reports feeling lilke an outcast in her family as her parents are moving to [place] where patient's brother and sister reside. Patient's current symptoms include excessive sleeping, low energy, poor motivation, feel alone, loss of interest, loss of libido, and depressed mood.  Patient last was seen in January 2020. She reports increased depressed mood, fatigue, isolative behaviors, poor motivation, diminished interest in activities, negative thoughts about self, and excessive sleeping. She reports increased work stress regarding patient being suspended for 3 days after a parent submitted a bullying report to the central office. Per patient's report, her comment in the classroom was misperceived by a student who peviously admitted lying on patient in another incident that  led to a reprimand last year. Patient expresses frustration and mistrust regarding principal and states feeling as though she has a target on her back. Patient has stopped exercising and neglected self-care regarding nutrition. She reports doing little in the evenings after work. She reports sleeping excessively especially during the day on weekends. Patient maintains phone contact with family but has decreased visits with family.   Suicidal/Homicidal: No  Therapist Response:  Reviewed symptoms, discussed stressors, facilitated expression of thoughts and feelings, validated feelings, discussed vicious cycle of depression and strategies to reverse cycle,  assisted patient develop plan to increase behavioral activation/self care (visit family nieces/nephew this weekend, begin walking plan ( 20 minutes 4-5 x per week)next week, assisted patient identify and address any thoughts or processes that would inhibit implementation of plan, assisted patient develop list of benefits of implementing walking plan and consequences of not implementing plan, assigned patient to read lists daily, discussed and provided patient with handouts - the cycle of depression and  mental health benefits of exercise, assigned patient to review,. Plan: Return again in 2 weeks  Diagnosis: Axis I: MDD,     Axis II: Deferred    [name], LCSW 07/09/2018

## 2018-07-15 ENCOUNTER — Encounter (HOSPITAL_COMMUNITY): Payer: Self-pay | Admitting: Psychiatry

## 2018-07-15 ENCOUNTER — Other Ambulatory Visit: Payer: Self-pay

## 2018-07-15 ENCOUNTER — Ambulatory Visit (INDEPENDENT_AMBULATORY_CARE_PROVIDER_SITE_OTHER): Payer: BC Managed Care – PPO | Admitting: Psychiatry

## 2018-07-15 DIAGNOSIS — F419 Anxiety disorder, unspecified: Secondary | ICD-10-CM

## 2018-07-15 DIAGNOSIS — F321 Major depressive disorder, single episode, moderate: Secondary | ICD-10-CM | POA: Diagnosis not present

## 2018-07-15 MED ORDER — VENLAFAXINE HCL ER 150 MG PO CP24: 300.0000 mg | ORAL_CAPSULE | Freq: Every day | ORAL | 2 refills | Status: DC

## 2018-07-15 MED ORDER — VORTIOXETINE HBR 20 MG PO TABS: 20.0000 mg | ORAL_TABLET | Freq: Every day | ORAL | 2 refills | Status: DC

## 2018-07-15 MED ORDER — BUPROPION HCL ER (XL) 150 MG PO TB24: ORAL_TABLET | ORAL | 2 refills | Status: DC

## 2018-07-15 NOTE — Progress Notes (Signed)
BH MD/PA/NP OP Progress Note  07/15/2018 4:47 PM Claire [name]  MRN:  [id number]  Chief Complaint:  Chief Complaint    Depression; Follow-up     HPI: This patient is a 38 year old single white female who lives alone in [place]. 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  The patient was initially referred by Dr. Syliva Overman for further assessment and treatment of depression. She has been seeing Florencia Reasons for several months in our office for counseling.  The patient states that she had one episode of depression previously about 10 years ago. This was during a time in which she was unemployed but she did not receive any specific treatment. She states that she had been doing well until approximately 2 years ago. At this time her sister's child was born and she felt like her parents attention was focused more for herself to the new baby. Her brother is since had a child as well. She is the only one in the family has not married it and does not have children and this makes her feel bad. 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  The patient's depression worsened over time and her primary Dr. put her on Effexor XR and the doses gradually been increased to 150 mg every morning. She states that each time its increase she feels better for about a month and then she feels worse again. Current symptoms include anhedonia, hypersomnia, feeling fatigue and low energy low motivation and occasional crying. She states that she has no interest in sex or dating. She spends most of her wake and sleeping. She talks on the phone to friends but has no interest in getting together. Most of her social life is with her family. She denies suicidal ideation or psychotic symptoms. She does not use drugs or alcohol she's not been the victim of any trauma or abuse either in childhood or as an adult. She states that  she eats too much and has gained quite a bit of weight recently and does not get any exercise her health is good except for chronic migraines which have improved with a combination of Topamax and Maxalt  Patient returns after 4 months.  She had missed 1 appointment due to work.  Her primary doctor, Dr. Lodema Hong, reached out to me a few days ago stating she thought the patient had become more depressed.  The patient states over the intervening months she had gotten "in trouble" at school several times.  At one time she had tried to turn with student around to get him to face and talk to her, another time she had thrown a marker at a child to wake him up in the third time she was making a general comment about kids who were "stitches in 1 of the kids thought he was talking about her, reported her to her mother and she was suspended from work for 3 days.  She has been feeling depressed about all of this.  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  She was not sleeping well and stopped exercising and had regained a lot of weight.  Her mood has become more depressed and sad and unmotivated.  She denies suicidal ideation.  She is on the maximal doses of both Wellbutrin and Effexor.  Since she is having so much trouble with the energy level suggested we switch from  the Wellbutrin to Trintellix. Visit Diagnosis:    ICD-10-CM   1. Moderate single current episode of major depressive disorder (HCC) F32.1 venlafaxine XR (EFFEXOR-XR) 150 MG 24 hr capsule    Past Psychiatric History: none  Past Medical History:  Past Medical History:  Diagnosis Date  . Acne    facial  . Arthritis   . Migraines   . Nicotine addiction   . Obesity     Past Surgical History:  Procedure Laterality Date  . CHOLECYSTECTOMY      Family Psychiatric History: none  Family History:  Family History  Problem Relation Age of Onset  . Hypertension Mother   . Cancer Mother 59       colon  . Obesity Mother   . Hypertension Father    . Diabetes Father   . Hyperlipidemia Father   . Obesity Father     Social History:  Social History   Socioeconomic History  . Marital status: Single    Spouse name: Not on file  . Number of children: 0  . Years of education: Bachelors  . Highest education level: Not on file  Occupational History  . 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 Financial resource strain: Not on file  . Food insecurity:    Worry: Not on file    Inability: Not on file  . Transportation needs:    Medical: Not on file    Non-medical: Not on file  Tobacco Use  . Smoking status: Former Smoker    Last attempt to quit: 11/13/2008    Years since quitting: 9.6  . Smokeless tobacco: Never Used  . Tobacco comment: Quit 2008, 04-02-2016 per pt stopped 2010  Substance and Sexual Activity  . Alcohol use: No    Alcohol/week: 0.0 standard drinks    Comment: occasional use  a beer once every 3 months or so, 04-02-2016 per pt occas.   . Drug use: No    Comment: 04-02-2016 per pt no  . Sexual activity: Not Currently    Birth control/protection: Pill  Lifestyle  . Physical activity:    Days per week: Not on file    Minutes per session: Not on file  . Stress: Not on file  Relationships  . Social connections:    Talks on phone: Not on file    Gets together: Not on file    Attends religious service: Not on file    Active member of club or organization: Not on file    Attends meetings of clubs or organizations: Not on file    Relationship status: Not on file  Other Topics Concern  . Not on file  Social History Narrative   Lives at home alone.   Right-handed.   Occasional use of caffeine.    Allergies: No Known Allergies  Metabolic Disorder Labs: Lab Results  Component Value Date   HGBA1C 5.2 11/06/2016   MPG 103 11/06/2016   MPG 108 11/06/2015   No results found for: PROLACTIN Lab Results  Component Value Date   CHOL 216 (H) 11/04/2017   TRIG 238 (H) 11/04/2017   HDL 53 11/04/2017    CHOLHDL 4.1 11/04/2017   VLDL 31 (H) 11/06/2016   LDLCALC 125 (H) 11/04/2017   LDLCALC 113 (H) 11/06/2016   Lab Results  Component Value Date   TSH 2.55 11/04/2017   TSH 1.53 11/06/2016    Therapeutic Level Labs: No results found for: LITHIUM No results found for: VALPROATE No components found for:  CBMZ  Current Medications: Current Outpatient Medications  Medication Sig Dispense Refill  . Glucosamine HCl (GLUCOSAMINE PO) Take by mouth daily.    Marland Kitchen ibuprofen (ADVIL,MOTRIN) 800 MG tablet TAKE 1 TABLET BY MOUTH TWICE DAILY AS NEEDED FOR  SEVERE  HEADACHE.  TAKE  ALONG  WITH  IMITREX  IF  NEEDED. 90 tablet 0  . loratadine (CLARITIN) 10 MG tablet TAKE ONE TABLET BY MOUTH ONCE DAILY 90 tablet 3  . Multiple Vitamin (MULTIVITAMIN) tablet Take 1 tablet by mouth daily.      . Norgestimate-Ethinyl Estradiol Triphasic (TRI-SPRINTEC) 0.18/0.215/0.25 MG-35 MCG tablet Take 1 tablet by mouth daily. 3 Package 3  . Norgestimate-Ethinyl Estradiol Triphasic (TRI-SPRINTEC) 0.18/0.215/0.25 MG-35 MCG tablet Take 1 tablet by mouth daily. 3 Package 3  . rizatriptan (MAXALT-MLT) 10 MG disintegrating tablet Take 1 tablet (10 mg total) by mouth as needed. May repeat in 2 hours if needed 12 tablet 11  . sulfamethoxazole-trimethoprim (BACTRIM DS,SEPTRA DS) 800-160 MG tablet Take 1 tablet by mouth 2 (two) times daily. 14 tablet 0  . topiramate (TOPAMAX) 100 MG tablet Take one tablet by mouth twice daily.  No further refills until seen. 180 tablet 4  . venlafaxine XR (EFFEXOR-XR) 150 MG 24 hr capsule Take 2 capsules (300 mg total) by mouth daily with breakfast. 90 capsule 2  . buPROPion (WELLBUTRIN XL) 150 MG 24 hr tablet Take one daily for 10 days, then stop 30 tablet 2  . vortioxetine HBr (TRINTELLIX) 20 MG TABS tablet Take 1 tablet (20 mg total) by mouth daily. 30 tablet 2   No current facility-administered medications for this visit.      Musculoskeletal: Strength & Muscle Tone: within normal limits Gait  & Station: normal Patient leans: N/A  Psychiatric Specialty Exam: Review of Systems  Constitutional: Positive for malaise/fatigue.  Psychiatric/Behavioral: Positive for depression.  All other systems reviewed and are negative.   Blood pressure 114/66, pulse 97, height 5\' 3"  (1.6 m), weight 299 lb (135.6 kg), SpO2 98 %.Body mass index is 52.97 kg/m.  General Appearance: Casual and Fairly Groomed  Eye Contact:  Good  Speech:  Clear and Coherent  Volume:  Normal  Mood:  Dysphoric  Affect:  Depressed  Thought Process:  Goal Directed  Orientation:  Full (Time, Place, and Person)  Thought Content: Rumination   Suicidal Thoughts:  No  Homicidal Thoughts:  No  Memory:  Immediate;   Good Recent;   Good Remote;   Good  Judgement:  Good  Insight:  Good  Psychomotor Activity:  Decreased  Concentration:  Concentration: Good and Attention Span: Good  Recall:  Good  Fund of Knowledge: Good  Language: Good  Akathisia:  No  Handed:  Right  AIMS (if indicated): not done  Assets:  Communication Skills Desire for Improvement Physical Health Resilience Social Support Talents/Skills  ADL's:  Intact  Cognition: WNL  Sleep:  Fair   Screenings: PHQ2-9     Office Visit from 07/06/2018 in [facility] Office Visit from 03/18/2018 in [facility] Counselor from [facility]/25/2018 [facility] Office Visit from 11/05/2017 in [facility] Office Visit from 07/02/2017 in [facility]  PHQ-2 Total Score  4  1  2  2  1   PHQ-9 Total Score  13  -  9  9  6        Assessment and Plan:  This patient is a 38 year old female with a history of depression and anxiety.  She has become  more depressed recently specifically because of issues at her school.  Her medications do not seem to be as helpful.  She will taper off Wellbutrin XL and start Trintellix at 5 mg and gradually work up to 20 mg daily.  She will continue Effexor XR  300 mg daily.  She will return to see me in 4 weeks  Diannia Rudereborah Ross, MD 07/15/2018, 4:47 PM

## 2018-07-17 ENCOUNTER — Telehealth (HOSPITAL_COMMUNITY): Payer: Self-pay | Admitting: *Deleted

## 2018-07-17 ENCOUNTER — Other Ambulatory Visit (HOSPITAL_COMMUNITY): Payer: Self-pay | Admitting: Psychiatry

## 2018-07-17 MED ORDER — BUPROPION HCL ER (XL) 150 MG PO TB24: ORAL_TABLET | ORAL | 2 refills | Status: DC

## 2018-07-17 NOTE — Telephone Encounter (Signed)
She is coming off of this and going onto trintellix, the script is correst

## 2018-07-17 NOTE — Telephone Encounter (Signed)
Dr Renaee Munda [facility] called for Clarification on the script :  buPROPion (WELLBUTRIN XL) 150 MG 24 hr tablet 30 tablet 2 07/15/2018    Sig: Take one daily for 10 days, then stop

## 2018-07-17 NOTE — Telephone Encounter (Signed)
Dr Tenny Craw the Clarification is for : #30 tablet 2 07/15/2018 Sig: Take one daily for 10 days, then stop

## 2018-07-17 NOTE — Telephone Encounter (Signed)
sent 

## 2018-07-17 NOTE — Telephone Encounter (Signed)
Dr Van Clines Rx needs Clarification on script:  buPROPion (WELLBUTRIN XL) 150 MG 24 hr tablet 30 tablet 2 07/15/2018    Sig: Take one daily for 10 days, then stop

## 2018-07-27 ENCOUNTER — Ambulatory Visit: Payer: BC Managed Care – PPO | Admitting: Family Medicine

## 2018-08-17 ENCOUNTER — Other Ambulatory Visit: Payer: Self-pay

## 2018-08-17 ENCOUNTER — Ambulatory Visit (INDEPENDENT_AMBULATORY_CARE_PROVIDER_SITE_OTHER): Payer: BC Managed Care – PPO | Admitting: Psychiatry

## 2018-08-17 DIAGNOSIS — F321 Major depressive disorder, single episode, moderate: Secondary | ICD-10-CM | POA: Diagnosis not present

## 2018-08-17 NOTE — Progress Notes (Signed)
Virtual Visit via Video Note  I connected with Claire [name] on 08/17/18 at  4:00 PM EDT by a video enabled telemedicine application and verified that I am speaking with the correct person using two identifiers.   I discussed the limitations of evaluation and management by telemedicine and the availability of in person appointments. The patient expressed understanding and agreed to proceed.  I provided 30 minutes of non-face-to-face time during this encounter.   [name], LCSW                               THERAPIST PROGRESS NOTE      Session Time:  Monday 08/17/2018 4:00 PM - 4:35 PM                    Participation Level: Active  Behavi  oral Response: CasualAlert/pleasant/much improved mood   Type of Therapy: Individual Therapy  Treatment Goals addressed:   1. learn and implement behavioral strategies to overcome depression.      2. Learn and implement calming strategies to reduce/manage stress and anxiety.              3. Identify and replace thoughts and beliefs that support depression and anxiety.               Interventions: CBT/Supportive  Summary: Claire [name] is a 38 y.o. female who presents with symptoms of depression that began about a year ago. She began taking antidepressants which helped initially and had to be increased. She recently began to experience low energy and poor motivation again and has been referred for therapy and medication management by PCP Dr. Syliva Overman. 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 Patient reports feeling lilke an outcast in her family as her parents are moving to [place] where patient's brother and sister reside. Patient's current symptoms include excessive sleeping, low energy, poor motivation, feel alone, loss of interest, loss of libido, and depressed  mood.  Patient last was seen in about 5 weeks ago. She reports much improved mood, increased energy and improved motivation since taking Trintellix as prescribed by psychiatrist Dr. Tenny Craw.  She has been residing at her parents' home in [place] since schools closed due to coronavirus pandemic. She reports good support from family and enjoying being around her niece and nephew. 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 She misses the classroom. However, she reports less stress regarding face to face interaction with students and not having any more issues like she experienced earlier this year until today. Per her report, a parent became offended when patient sent a message including a word in all caps about where a student could find his assignment on line. Parent then complained to principal this appeared to be equivalent to yelling per patient's report. Patient received reprimand from principal but denies being overwhelmed or demoralized by this. Patient reports she has gained weight as she has been eating more and has not been able to go to gym. She reports having some sleep difficulty for the past few weeks.   Suicidal/Homicidal: No  Therapist Response:  Reviewed symptoms, discussed stressors, facilitated expression of thoughts and feelings, validated feelings, assisted patient identify helpful thought patterns and ways to respond to  being reprimanded, assisted patient develop plan to walk 30 minutes 3-4 x per week, advised patient to talk with Dr. Tenny Crawoss regarding sleep difficulty  Plan: Return again in 2 weeks  Diagnosis: Axis I: MDD,     Axis II: Deferred    Adah Salvageeggy E Mico Spark, LCSW 08/17/2018

## 2018-08-18 ENCOUNTER — Ambulatory Visit (INDEPENDENT_AMBULATORY_CARE_PROVIDER_SITE_OTHER): Payer: BC Managed Care – PPO | Admitting: Psychiatry

## 2018-08-18 ENCOUNTER — Encounter (HOSPITAL_COMMUNITY): Payer: Self-pay | Admitting: Psychiatry

## 2018-08-18 DIAGNOSIS — F321 Major depressive disorder, single episode, moderate: Secondary | ICD-10-CM | POA: Diagnosis not present

## 2018-08-18 MED ORDER — VORTIOXETINE HBR 20 MG PO TABS: 20.0000 mg | ORAL_TABLET | Freq: Every day | ORAL | 2 refills | Status: DC

## 2018-08-18 MED ORDER — VENLAFAXINE HCL ER 150 MG PO CP24: 150.0000 mg | ORAL_CAPSULE | Freq: Every day | ORAL | 2 refills | Status: DC

## 2018-08-18 NOTE — Progress Notes (Signed)
BH MD/PA/NP OP Progress Note  08/18/2018 3:55 PM [address]  MRN:  [id number]  Chief Complaint:  depression Virtual Visit via Video Note  I connected with Claire [name] on 08/18/18 at  3:40 PM EDT by a video enabled telemedicine application and verified that I am speaking with the correct person using two identifiers.   I discussed the limitations of evaluation and management by telemedicine and the availability of in person appointments. The patient expressed understanding and agreed to proceed.      I discussed the assessment and treatment plan with the patient. The patient was provided an opportunity to ask questions and all were answered. The patient agreed with the plan and demonstrated an understanding of the instructions.   The patient was advised to call back or seek an in-person evaluation if the symptoms worsen or if the condition fails to improve as anticipated.  I provided 15 minutes of non-face-to-face time during this encounter.   Diannia Ruder, MD This patient is a 38 year old single white female who lives alone in [place]. 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  The patient was initially referred by Dr. Syliva Overman for further assessment and treatment of depression. She has been seeing Florencia Reasons for several months in our office for counseling.  The patient states that she had one episode of depression previously about 10 years ago. This was during a time in which she was unemployed but she did not receive any specific treatment. She states that she had been doing well until approximately 2 years ago. At this time her sister's child was born and she felt like her parents attention was focused more for herself to the new baby. Her brother is since had a child as well. She is the only one in the family has not married it and does not have children and this makes her feel bad. Her job is also been very stressful at times and she is under a lot of  pressure because of test score expectations for her students.  The patient's depression worsened over time and her primary Dr. put her on Effexor XR and the doses gradually been increased to 150 mg every morning. She states that each time its increase she feels better for about a month and then she feels worse again. Current symptoms include anhedonia, hypersomnia, feeling fatigue and low energy low motivation and occasional crying. She states that she has no interest in sex or dating. She spends most of her wake and sleeping. She talks on the phone to friends but has no interest in getting together. Most of her social life is with her family. She denies suicidal ideation or psychotic symptoms. She does not use drugs or alcohol she's not been the victim of any trauma or abuse either in childhood or as an adult. She states that she eats too much and has gained quite a bit of weight recently and does not get any exercise her health is good except for chronic migraines which have improved with a combination of Topamax and Maxalt  The patient returns for follow-up after 4 weeks.  Last time we had switched her off Wellbutrin and onto Trintellix.  She is also still taking Effexor XR 300 mg daily.  She states that she feels much better taking the Trintellix.  Her energy mood motivation and focus are all improved.  Her only complaint is that she is not sleeping as well and wakes up several times  per night.  She takes the medications in the morning.  I suggested that we come down a little bit on the Effexor XR since she may be having too much activation with both drugs.  She is staying with her family right now during the coronavirus pandemic and is enjoying being around people.  She is probably not getting quite enough exercise and she is going to start walking every day as well.  She was seen today via video telemedicine due to the coronavirus pandemic  Visit Diagnosis:    ICD-10-CM   1. Moderate single current  episode of major depressive disorder (HCC) F32.1 venlafaxine XR (EFFEXOR-XR) 150 MG 24 hr capsule    Past Psychiatric History: none  Past Medical History:  Past Medical History:  Diagnosis Date  . Acne    facial  . Arthritis   . Migraines   . Nicotine addiction   . Obesity     Past Surgical History:  Procedure Laterality Date  . CHOLECYSTECTOMY      Family Psychiatric History: none  Family History:  Family History  Problem Relation Age of Onset  . Hypertension Mother   . Cancer Mother [id number]       colon  . Obesity Mother   . Hypertension Father   . Diabetes Father   . Hyperlipidemia Father   . Obesity Father     Social History:  Social History   Socioeconomic History  . Marital status: Single    Spouse name: Not on file  . Number of children: 0  . Years of education: Bachelors  . Highest education level: Not on file  Occupational History  . 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 Financial resource strain: Not on file  . Food insecurity:    Worry: Not on file    Inability: Not on file  . Transportation needs:    Medical: Not on file    Non-medical: Not on file  Tobacco Use  . Smoking status: Former Smoker    Last attempt to quit: 11/13/2008    Years since quitting: 9.7  . Smokeless tobacco: Never Used  . Tobacco comment: Quit 2008, 04-02-2016 per pt stopped 2010  Substance and Sexual Activity  . Alcohol use: No    Alcohol/week: 0.0 standard drinks    Comment: occasional use  a beer once every 3 months or so, 04-02-2016 per pt occas.   . Drug use: No    Comment: 04-02-2016 per pt no  . Sexual activity: Not Currently    Birth control/protection: Pill  Lifestyle  . Physical activity:    Days per week: Not on file    Minutes per session: Not on file  . Stress: Not on file  Relationships  . Social connections:    Talks on phone: Not on file    Gets together: Not on file    Attends religious service: Not on file    Active member of club or  organization: Not on file    Attends meetings of clubs or organizations: Not on file    Relationship status: Not on file  Other Topics Concern  . Not on file  Social History Narrative   Lives at home alone.   Right-handed.   Occasional use of caffeine.    Allergies: No Known Allergies  Metabolic Disorder Labs: Lab Results  Component Value Date   HGBA1C 5.2 11/06/2016   MPG 103 11/06/2016   MPG 108 11/06/2015   No results found for: PROLACTIN  Lab Results  Component Value Date   CHOL 216 (H) 11/04/2017   TRIG 238 (H) 11/04/2017   HDL 53 11/04/2017   CHOLHDL 4.1 11/04/2017   VLDL 31 (H) 11/06/2016   LDLCALC 125 (H) 11/04/2017   LDLCALC 113 (H) 11/06/2016   Lab Results  Component Value Date   TSH 2.55 11/04/2017   TSH 1.53 11/06/2016    Therapeutic Level Labs: No results found for: LITHIUM No results found for: VALPROATE No components found for:  CBMZ  Current Medications: Current Outpatient Medications  Medication Sig Dispense Refill  . Glucosamine HCl (GLUCOSAMINE PO) Take by mouth daily.    Marland Kitchen ibuprofen (ADVIL,MOTRIN) 800 MG tablet TAKE 1 TABLET BY MOUTH TWICE DAILY AS NEEDED FOR  SEVERE  HEADACHE.  TAKE  ALONG  WITH  IMITREX  IF  NEEDED. 90 tablet 0  . loratadine (CLARITIN) 10 MG tablet TAKE ONE TABLET BY MOUTH ONCE DAILY 90 tablet 3  . Multiple Vitamin (MULTIVITAMIN) tablet Take 1 tablet by mouth daily.      . Norgestimate-Ethinyl Estradiol Triphasic (TRI-SPRINTEC) 0.18/0.215/0.25 MG-35 MCG tablet Take 1 tablet by mouth daily. 3 Package 3  . Norgestimate-Ethinyl Estradiol Triphasic (TRI-SPRINTEC) 0.18/0.215/0.25 MG-35 MCG tablet Take 1 tablet by mouth daily. 3 Package 3  . rizatriptan (MAXALT-MLT) 10 MG disintegrating tablet Take 1 tablet (10 mg total) by mouth as needed. May repeat in 2 hours if needed 12 tablet 11  . sulfamethoxazole-trimethoprim (BACTRIM DS,SEPTRA DS) 800-160 MG tablet Take 1 tablet by mouth 2 (two) times daily. 14 tablet 0  . topiramate  (TOPAMAX) 100 MG tablet Take one tablet by mouth twice daily.  No further refills until seen. 180 tablet 4  . venlafaxine XR (EFFEXOR-XR) 150 MG 24 hr capsule Take 1 capsule (150 mg total) by mouth daily with breakfast. 90 capsule 2  . vortioxetine HBr (TRINTELLIX) 20 MG TABS tablet Take 1 tablet (20 mg total) by mouth daily. 30 tablet 2   No current facility-administered medications for this visit.      Musculoskeletal: Strength & Muscle Tone: within normal limits Gait & Station:normal Patient leans: N/A  Psychiatric Specialty Exam: Review of Systems  Psychiatric/Behavioral: The patient has insomnia.   All other systems reviewed and are negative.   There were no vitals taken for this visit.There is no height or weight on file to calculate BMI.  General Appearance: Casual and Fairly Groomed  Eye Contact:  Good  Speech:  Clear and Coherent  Volume:  Normal  Mood:  Euthymic  Affect:  Appropriate and Congruent  Thought Process:  Coherent  Orientation:  Full (Time, Place, and Person)  Thought Content: WDL   Suicidal Thoughts:  No  Homicidal Thoughts:  No  Memory:  Immediate;   Good Recent;   Good Remote;   Good  Judgement:  Good  Insight:  Good  Psychomotor Activity:  Normal  Concentration:  Concentration: Good and Attention Span: Good  Recall:  Good  Fund of Knowledge: Good  Language: Good  Akathisia:  No  Handed:  Right  AIMS (if indicated): not done  Assets:  Communication Skills Desire for Improvement Physical Health Resilience Social Support Talents/Skills  ADL's:  Intact  Cognition: WNL  Sleep:  Fair   Screenings: PHQ2-9     Office Visit from 07/06/2018 in [facility] Office Visit from 03/18/2018 in [facility] Counselor from [facility]/25/2018 [facility] Office Visit from 11/05/2017 in [facility] Office Visit from 07/02/2017 in [facility]  PHQ-2 Total Score  PHQ-9 Total Score  13  -  Assessment and Plan: This patient is a 38 year old female with a history of depression.  She had gotten worse recently but now is doing better with the addition of Trintellix 20 mg daily.  She may have too much activation that is affecting her sleep so we will cut Effexor XR down to  150 mg every morning.  I have also advised her to try melatonin 5 to 10 mg at bedtime to help with her sleep.  Otherwise she will return to see me in 2 months or call sooner if needed   Diannia Ruder, MD 08/18/2018, 3:55 PM

## 2018-08-19 ENCOUNTER — Other Ambulatory Visit: Payer: Self-pay

## 2018-08-24 ENCOUNTER — Telehealth (HOSPITAL_COMMUNITY): Payer: Self-pay | Admitting: *Deleted

## 2018-08-24 ENCOUNTER — Other Ambulatory Visit (HOSPITAL_COMMUNITY): Payer: Self-pay | Admitting: Psychiatry

## 2018-08-24 DIAGNOSIS — F321 Major depressive disorder, single episode, moderate: Secondary | ICD-10-CM

## 2018-08-24 MED ORDER — VENLAFAXINE HCL ER 150 MG PO CP24: 300.0000 mg | ORAL_CAPSULE | Freq: Every day | ORAL | 2 refills | Status: DC

## 2018-08-24 NOTE — Telephone Encounter (Signed)
I changed it back up to 300 mg, tell her to try melatonin for sleep

## 2018-08-24 NOTE — Telephone Encounter (Signed)
Dr Tenny Craw Patient called as instructed & stated the the decrease in the Effexor Take 1 capsule (150 mg total) by mouth daily with breakfast is affecting her Mood. She stated "we may have decreased it to much".  Please send any new scripts to the [facility] [address] [place], [place] - [address]

## 2018-08-25 NOTE — Telephone Encounter (Signed)
LVM  I changed it back up to 300 mg, tell her to try melatonin for sleep

## 2018-08-31 ENCOUNTER — Other Ambulatory Visit: Payer: Self-pay

## 2018-08-31 ENCOUNTER — Ambulatory Visit (INDEPENDENT_AMBULATORY_CARE_PROVIDER_SITE_OTHER): Payer: BC Managed Care – PPO | Admitting: Psychiatry

## 2018-08-31 DIAGNOSIS — F321 Major depressive disorder, single episode, moderate: Secondary | ICD-10-CM | POA: Diagnosis not present

## 2018-08-31 NOTE — Progress Notes (Signed)
Virtual Visit via Video Note  I connected with [name] on 08/31/18 at 4:15 PM EDT by a video enabled telemedicine application and verified that I am speaking with the correct person using two identifiers.   I discussed the limitations of evaluation and management by telemedicine and the availability of in person appointments. The patient expressed understanding and agreed to proceed.   I provided 40 minutes of non-face-to-face time during this encounter.   Claire Salvage, LCSW                               THERAPIST PROGRESS NOTE      Session Time:  Monday 5/4//2020 4:15 PM - 4:55 PM                    Participation Level: Active  Behavi  oral Response: CasualAlert/pleasant/much improved mood   Type of Therapy: Individual Therapy  Treatment Goals addressed:   1. learn and implement behavioral strategies to overcome depression.      2. Learn and implement calming strategies to reduce/manage stress and anxiety.              3. Identify and replace thoughts and beliefs that support depression and anxiety.               Interventions: CBT/Supportive  Summary: Claire [name] is a 38 y.o. female who presents with symptoms of depression that began about a year ago. She began taking antidepressants which helped initially and had to be increased. She recently began to experience low energy and poor motivation again and has been referred for therapy and medication management by PCP Dr. Syliva Overman. 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 Patient reports feeling lilke an outcast in her family as her parents are moving to [place] where patient's brother and sister reside. Patient's current symptoms include excessive sleeping, low energy, poor motivation, feel alone, loss of interest, loss of libido, and depressed  mood.  Patient last's contact was by virtual visit via video about 2 weeks ago. She reports she and father had conflict about a week ago after he yelled at her. She reports becoming very upset and going to her room. She wouldn't come out and says she had problems with her mood since effexor was reduced. She contacted office and Dr. Tenny Craw advised patient to resume her regular dose. Patient states she now feels much better. She went back to her home in [place] this past weekend while her parents were away. She did leave a note expressing the concerns and feelings about the conflict. She reports seeing parents at birthday party for her nephew yesterday and following up with them about the note.  She expresses hurt and frustration regarding her father yelling at her. She also expresses regret regarding the incident.j She report she has continued to eat excessively and states she has not started walking plan. 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

## 2018-09-14 ENCOUNTER — Ambulatory Visit (HOSPITAL_COMMUNITY): Payer: BC Managed Care – PPO | Admitting: Psychiatry

## 2018-09-16 ENCOUNTER — Ambulatory Visit (INDEPENDENT_AMBULATORY_CARE_PROVIDER_SITE_OTHER): Payer: BC Managed Care – PPO | Admitting: Psychiatry

## 2018-09-16 ENCOUNTER — Other Ambulatory Visit: Payer: Self-pay

## 2018-09-16 DIAGNOSIS — F321 Major depressive disorder, single episode, moderate: Secondary | ICD-10-CM

## 2018-09-16 NOTE — Progress Notes (Signed)
Virtual Visit via Video Note  I connected with Claire [name] on 09/16/18 at  3:00 PM EDT by a video enabled telemedicine application and verified that I am speaking with the correct person using two identifiers.   I discussed the limitations of evaluation and management by telemedicine and the availability of in person appointments. The patient expressed understanding and agreed to proceed.   I provided 40 minutes of non-face-to-face time during this encounter.   Adah SalvagePeggy E Bynum, LCSW                                THERAPIST PROGRESS NOTE      Session Time:  Wednesday 09/16/2018 3:00 PM - 3:40 PM                   Participation Level: Active  Behavi  oral Response: CasualAlert/pleasant/much improved mood   Type of Therapy: Individual Therapy  Treatment Goals addressed:   1. learn and implement behavioral strategies to overcome depression.      2. Learn and implement calming strategies to reduce/manage stress and anxiety.              3. Identify and replace thoughts and beliefs that support depression and anxiety.               Interventions: CBT/Supportive  Summary: Claire GroveSheila M [name] is a 38 y.o. female who presents with symptoms of depression that began about a year ago. She began taking antidepressants which helped initially and had to be increased. She recently began to experience low energy and poor motivation again and has been referred for therapy and medication management by PCP Dr. Syliva OvermanMargaret [name]. 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 Patient reports feeling lilke an outcast in her family as her [place]'s [place] and sister reside. Patient's current symptoms include excessive sleeping, low energy, poor motivation, feel alone, loss of interest, loss of libido, and depressed  mood.  Patient last's contact was by virtual visit via video about 2 weeks ago. She reports increased stress associated with end of school year demands. She has several assignments due this Friday. She reports additional stress related to landlord informing her this past Tuesday he is ending their contract the end of June. She suspects this is triggered by her recent complaints about repairs needing to be completed. She used assertiveness skills to try to discuss matter with landlord but he still wants to end their agreement. She currently is residing with her parents. She says they are getting along well but she just hadn't anticipated she would be living with them at this time. She reports increased sleep difficulty, poor concentration, fatigue, and decreased physical activity. She reports she has walked only once since last session and expresses frustration with self due to weight gain. She reports thoughts of not having enough energy and not feeling like walking.  Suicidal/Homicidal: No  Therapist Response:  Reviewed symptoms, discussed stressors, facilitated expression of thoughts and feelings, validated feelings, discussed back burner/front burner technique to help patient identify ways to manage her worries and prioritize her responsibilities, assisted patient identify thoughts and processes that inhibited implementation of walking plan, assisted patient identify different way of relating to thoughts and identify ways to pursue action  consistent with her value of positive health and self-care, assisted patient develop plan to walk for 10 minutes daily for two days and then increase by 5 minutes daily until she is walking 30 minutes daily, also discussed ways to increase physical activity during the course of her day to avoid sitting for long periods at computer   Plan: Return again in 2 weeks  Diagnosis: Axis I: MDD,     Axis II: Deferred    [name], LCSW 09/16/2018

## 2018-09-30 ENCOUNTER — Ambulatory Visit (INDEPENDENT_AMBULATORY_CARE_PROVIDER_SITE_OTHER): Payer: BC Managed Care – PPO | Admitting: Psychiatry

## 2018-09-30 ENCOUNTER — Other Ambulatory Visit: Payer: Self-pay | Admitting: Family Medicine

## 2018-09-30 ENCOUNTER — Other Ambulatory Visit: Payer: Self-pay

## 2018-09-30 ENCOUNTER — Encounter: Payer: Self-pay | Admitting: Family Medicine

## 2018-09-30 DIAGNOSIS — F321 Major depressive disorder, single episode, moderate: Secondary | ICD-10-CM

## 2018-09-30 MED ORDER — PANTOPRAZOLE SODIUM 40 MG PO TBEC: DELAYED_RELEASE_TABLET | ORAL | 1 refills | Status: DC

## 2018-09-30 MED ORDER — IBUPROFEN 800 MG PO TABS: ORAL_TABLET | ORAL | 1 refills | Status: DC

## 2018-09-30 NOTE — Progress Notes (Signed)
buprofen 800

## 2018-09-30 NOTE — Progress Notes (Signed)
Virtual Visit via Video Note  I connected with Claire [name] on 09/30/18 at  1:00 PM EDT by a video enabled telemedicine application and verified that I am speaking with the correct person using two identifiers.   I discussed the limitations of evaluation and management by telemedicine and the availability of in person appointments. The patient expressed understanding and agreed to proceed.  I provided 50 minutes of non-face-to-face time during this encounter.   [name], LCSW                               THERAPIST PROGRESS NOTE      Session Time:  Wednesday 09/30/2018 1:00 PM -  1:50 PM               Participation Level: Active  Behavi  oral Response: CasualAlert/pleasant/much improved mood   Type of Therapy: Individual Therapy  Treatment Goals addressed:   1. learn and implement behavioral strategies to overcome depression.      2. Learn and implement calming strategies to reduce/manage stress and anxiety.              3. Identify and replace thoughts and beliefs that support depression and anxiety.               Interventions: CBT/Supportive  Summary: Claire [name] is a 38 y.o. female who presents with symptoms of depression that began about a year ago. She began taking antidepressants which helped initially and had to be increased. She recently began to experience low energy and poor motivation again and has been referred for therapy and medication management by PCP Dr. Syliva Overman. 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 Patient reports feeling lilke an outcast in her family as her parents are moving to [place] where patient's brother and sister reside. Patient's current symptoms include excessive sleeping, low energy, poor motivation, feel alone, loss of interest, loss of libido, and depressed  mood.  Patient last's contact was by virtual visit via video about 2 weeks ago. 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 She also expresses relief she does not have to move. Per her report, she talked with landlord again and they were able to clarify misunderstanding. She reports she did not implement walking plan as she has just felt more tired and sluggish. She also reports poor eating patterns and eating more fast food due to her freezer not working properly. She is working with landlord regarding this and says new refrigerator /freezer is suppose to arrive in the next 7-10 days.  Suicidal/Homicidal: No  Therapist Response:  Reviewed symptoms, discussed stressors, facilitated expression of thoughts and feelings, validated feelings, praised and reinforced patient's use of assertiveness skills, discussed thoughts and processes that inhibited implementation of walking plan, developed plan for patient to walk 10 minutes daily beginning tomorrow, also discussed ways to improve eating patters through planning quick healthy meals, begin to review treatment plan  Plan: Return again in 2 weeks  Diagnosis: Axis I: MDD,     Axis II: Deferred    [name], LCSW 09/30/2018

## 2018-10-14 ENCOUNTER — Other Ambulatory Visit: Payer: Self-pay

## 2018-10-14 ENCOUNTER — Ambulatory Visit (INDEPENDENT_AMBULATORY_CARE_PROVIDER_SITE_OTHER): Payer: BC Managed Care – PPO | Admitting: Psychiatry

## 2018-10-14 DIAGNOSIS — F321 Major depressive disorder, single episode, moderate: Secondary | ICD-10-CM | POA: Diagnosis not present

## 2018-10-14 NOTE — Progress Notes (Addendum)
Virtual Visit via Video Note  I connected with DAVETTE NUGENT on 10/14/18 at  3:00 PM EDT by a video enabled telemedicine application and verified that I am speaking with the correct person using two identifiers.   I discussed the limitations of evaluation and management by telemedicine and the availability of in person appointments. The patient expressed understanding and agreed to proceed.   I provided 35 minutes of non-face-to-face time during this encounter.   Alonza Smoker, LCSW                               THERAPIST PROGRESS NOTE      Session Time:  Wednesday 10/14/2018 3:00PM - 3:35 PM   Participation Level: Active  Behavioral Response: CasualAlert/pleasant/much improved mood   Type of Therapy: Individual Therapy  Treatment Goals addressed:   1. learn and implement behavioral strategies to overcome depression.      2. Learn and implement calming strategies to reduce/manage stress and anxiety.              3. Identify and replace thoughts and beliefs that support depression and anxiety.               Interventions: CBT/Supportive  Summary: SILAS SEDAM is a 38 y.o. female who presents with symptoms of depression that began about a year ago. She began taking antidepressants which helped initially and had to be increased. She recently began to experience low energy and poor motivation again and has been referred for therapy and medication management by PCP Dr. Tula Nakayama. 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 Patient reports feeling lilke an outcast in her family as her parents are moving to [place] where patient's brother and sister reside. Patient's current symptoms include excessive sleeping, low energy, poor motivation, feel alone, loss of interest, loss of libido, and depressed mood.  Patient last's  contact was by virtual visit via video about 2 weeks ago. She reports decreased stress as she has completed this academic year. 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 She expresses frustration her refrigerator/freezer has not been delivered and her landlord has not completed repairs on her bathroom. Last communication with landlord was this week and she was informed appliance would be delivered next week. However, she reports feeling better as she implemented walking plan and has improved eating patterns. She reports making better food choices including eating salads, decreasing carbs.  She is pleased with her 3 pound weight loss in the past week. She has been staying with parents for the past few days and reports very positive relationship and interaction with them. She also is enjoying being with her niece and nephew.     Suicidal/Homicidal: No  Therapist Response:  Reviewed symptoms, praised and reinforced patient's implementation of walking plan and improved eating patterns, discussed effects, assisted patient identify ways to maintain consistent implementation of plans, discussed stressors, facilitated expression of thoughts and feelings, validated feelings, reviewed treatment plan, obtained patient's permission to initial plan for patient to indicate her participation and agreement with the review as this is a virtual visit, discussed next steps for treatment to include focusing on learning and implementing relapse prevention strategies, introduced mindfulness, emailed patient handout  on mindfulness and the window of tolerance, assigned her to review in preparation for next session   Plan: Return again in 2 weeks  Diagnosis: Axis I: MDD,     Axis II: Deferred    Adah Salvageeggy E Amore Grater, LCSW 10/14/2018

## 2018-10-26 ENCOUNTER — Encounter (HOSPITAL_COMMUNITY): Payer: Self-pay | Admitting: Psychiatry

## 2018-10-26 ENCOUNTER — Other Ambulatory Visit: Payer: Self-pay

## 2018-10-26 ENCOUNTER — Ambulatory Visit (INDEPENDENT_AMBULATORY_CARE_PROVIDER_SITE_OTHER): Payer: BC Managed Care – PPO | Admitting: Psychiatry

## 2018-10-26 DIAGNOSIS — F321 Major depressive disorder, single episode, moderate: Secondary | ICD-10-CM

## 2018-10-26 NOTE — Progress Notes (Signed)
Virtual Visit via Video Note  I connected with Claire [name] on 10/26/18 at  1:20 PM EDT by a video enabled telemedicine application and verified that I am speaking with the correct person using two identifiers.   I discussed the limitations of evaluation and management by telemedicine and the availability of in person appointments. The patient expressed understanding and agreed to proceed.     I discussed the assessment and treatment plan with the patient. The patient was provided an opportunity to ask questions and all were answered. The patient agreed with the plan and demonstrated an understanding of the instructions.   The patient was advised to call back or seek an in-person evaluation if the symptoms worsen or if the condition fails to improve as anticipated.  I provided 15 minutes of non-face-to-face time during this encounter.   Levonne Spiller, MD  [facility] MD/PA/NP OP Progress Note  10/26/2018 3:45 PM Claire [name]  MRN:  [id number]  Chief Complaint:  Chief Complaint    Depression; Follow-up     HPI:  This patient is a 38 year old single white female who lives alone in [place]. 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  The patient was initially referred by Dr. Tula Nakayama for further assessment and treatment of depression. She has been seeing Maurice Small for several months in our office for counseling.  The patient states that she had one episode of depression previously about 10 years ago. This was during a time in which she was unemployed but she did not receive any specific treatment. She states that she had been doing well until approximately 2 years ago. At this time her sister's child was born and she felt like her parents attention was focused more for herself to the new baby. Her brother is since had a child as well. She is the only one in the family has not married it and does not have children and this makes her feel bad. 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  The patient's depression worsened over time and her primary Dr. put her on Effexor XR and the doses gradually been increased to 150 mg every morning. She states that each time its increase she feels better for about a month and then she feels worse again. Current symptoms include anhedonia, hypersomnia, feeling fatigue and low energy low motivation and occasional crying. She states that she has no interest in sex or dating. She spends most of her wake and sleeping. She talks on the phone to friends but has no interest in getting together. Most of her social life is with her family. She denies suicidal ideation or psychotic symptoms. She does not use drugs or alcohol she's not been the victim of any trauma or abuse either in childhood or as an adult. She states that she eats too much and has gained quite a bit of weight recently and does not get any exercise her health is good except for chronic migraines which have improved with a combination of Topamax and Maxalt  The patient returns after 3 months.  She is doing better since we added Trintellix for depression to her regimen.  I tried to cut down her Effexor Exar because she was not sleeping but once we cut it down she got more depressed so was backed up to 300 mg daily along with the Trintellix.  She states that she is feeling good.  Her mood has been stable.  She denies any thoughts of depression or self-harm.  Her energy is good.  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 Visit Diagnosis:    ICD-10-CM   1. Moderate single current episode of major depressive disorder (HCC)  F32.1     Past Psychiatric History:none  Past Medical History:  Past Medical History:  Diagnosis Date  . Acne    facial  . Arthritis   . Migraines   . Nicotine addiction   . Obesity      Past Surgical History:  Procedure Laterality Date  . CHOLECYSTECTOMY      Family Psychiatric History: none  Family History:  Family History  Problem Relation Age of Onset  . Hypertension Mother   . Cancer Mother [id number]       colon  . Obesity Mother   . Hypertension Father   . Diabetes Father   . Hyperlipidemia Father   . Obesity Father     Social History:  Social History   Socioeconomic History  . Marital status: Single    Spouse name: Not on file  . Number of children: 0  . Years of education: Bachelors  . Highest education level: Not on file  Occupational History  . 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 Financial resource strain: Not on file  . Food insecurity    Worry: Not on file    Inability: Not on file  . Transportation needs    Medical: Not on file    Non-medical: Not on file  Tobacco Use  . Smoking status: Former Smoker    Quit date: 11/13/2008    Years since quitting: 9.9  . Smokeless tobacco: Never Used  . Tobacco comment: Quit 2008, 04-02-2016 per pt stopped 2010  Substance and Sexual Activity  . Alcohol use: No    Alcohol/week: 0.0 standard drinks    Comment: occasional use  a beer once every 3 months or so, 04-02-2016 per pt occas.   . Drug use: No    Comment: 04-02-2016 per pt no  . Sexual activity: Not Currently    Birth control/protection: Pill  Lifestyle  . Physical activity    Days per week: Not on file    Minutes per session: Not on file  . Stress: Not on file  Relationships  . Social Musicianconnections    Talks on phone: Not on file    Gets together: Not on file    Attends religious service: Not on file    Active member of club or organization: Not on file    Attends meetings of clubs or organizations: Not on file    Relationship status: Not on file  Other Topics Concern  . Not on file  Social History Narrative   Lives at home alone.   Right-handed.   Occasional use of caffeine.    Allergies: No Known Allergies  Metabolic  Disorder Labs: Lab Results  Component Value Date   HGBA1C 5.2 11/06/2016   MPG 103 11/06/2016   MPG 108 11/06/2015   No results found for: PROLACTIN Lab Results  Component Value Date   CHOL 216 (H) 11/04/2017   TRIG 238 (H) 11/04/2017   HDL 53 11/04/2017   CHOLHDL 4.1 11/04/2017   VLDL 31 (H) 11/06/2016   LDLCALC 125 (H) 11/04/2017   LDLCALC 113 (H)  11/06/2016   Lab Results  Component Value Date   TSH 2.55 11/04/2017   TSH 1.53 11/06/2016    Therapeutic Level Labs: No results found for: LITHIUM No results found for: VALPROATE No components found for:  CBMZ  Current Medications: Current Outpatient Medications  Medication Sig Dispense Refill  . Glucosamine HCl (GLUCOSAMINE PO) Take by mouth daily.    Marland Kitchen. ibuprofen (ADVIL) 800 MG tablet Take one tablet by mouth, twice daily , if needed , for severe headache , along with immitrex 30 tablet 1  . loratadine (CLARITIN) 10 MG tablet TAKE ONE TABLET BY MOUTH ONCE DAILY 90 tablet 3  . Multiple Vitamin (MULTIVITAMIN) tablet Take 1 tablet by mouth daily.      . Norgestimate-Ethinyl Estradiol Triphasic (TRI-SPRINTEC) 0.18/0.215/0.25 MG-35 MCG tablet Take 1 tablet by mouth daily. 3 Package 3  . Norgestimate-Ethinyl Estradiol Triphasic (TRI-SPRINTEC) 0.18/0.215/0.25 MG-35 MCG tablet Take 1 tablet by mouth daily. 3 Package 3  . pantoprazole (PROTONIX) 40 MG tablet Take one tablet 2 times daily for 2 days, when you take ibuprofen for headache 60 tablet 1  . rizatriptan (MAXALT-MLT) 10 MG disintegrating tablet Take 1 tablet (10 mg total) by mouth as needed. May repeat in 2 hours if needed 12 tablet 11  . sulfamethoxazole-trimethoprim (BACTRIM DS,SEPTRA DS) 800-160 MG tablet Take 1 tablet by mouth 2 (two) times daily. 14 tablet 0  . topiramate (TOPAMAX) 100 MG tablet Take one tablet by mouth twice daily.  No further refills until seen. 180 tablet 4  . venlafaxine XR (EFFEXOR-XR) 150 MG 24 hr capsule Take 2 capsules (300 mg total) by mouth daily  with breakfast. 90 capsule 2  . vortioxetine HBr (TRINTELLIX) 20 MG TABS tablet Take 1 tablet (20 mg total) by mouth daily. 30 tablet 2   No current facility-administered medications for this visit.      Musculoskeletal: Strength & Muscle Tone: within normal limits Gait & Station: normal Patient leans: N/A  Psychiatric Specialty Exam: Review of Systems  All other systems reviewed and are negative.   There were no vitals taken for this visit.There is no height or weight on file to calculate BMI.  General Appearance: Casual, Neat and Well Groomed  Eye Contact:  Good  Speech:  Clear and Coherent  Volume:  Normal  Mood:  Euthymic  Affect:  Appropriate and Congruent  Thought Process:  Goal Directed  Orientation:  Full (Time, Place, and Person)  Thought Content: WDL   Suicidal Thoughts:  No  Homicidal Thoughts:  No  Memory:  Immediate;   Good Recent;   Good Remote;   Good  Judgement:  Good  Insight:  Good  Psychomotor Activity:  Normal  Concentration:  Concentration: Good and Attention Span: Good  Recall:  Good  Fund of Knowledge: Good  Language: Good  Akathisia:  No  Handed:  Right  AIMS (if indicated): not done  Assets:  Communication Skills Desire for Improvement Physical Health Resilience Social Support Talents/Skills  ADL's:  Intact  Cognition: WNL  Sleep:  Good   Screenings: PHQ2-9     Office Visit from 07/06/2018 in [facility] Office Visit from 03/18/2018 in [facility] Counselor [facility] Office Visit from 11/05/2017 in [facility] Office Visit from 07/02/2017 in [facility]  PHQ-2 Total Score  4  1  2  2  1   PHQ-9 Total Score  13  -  9  9  6  Assessment and Plan: This patient is a 38 year old female with a history of depression.  She is doing better on her current regimen and is started sleeping well.  She will continue Effexor XR 300 mg daily  as well as Trintellix 20 mg daily for depression.  She will return to see me in 3 months   Diannia Rudereborah Adilynne Fitzwater, MD 10/26/2018, 3:45 PM

## 2018-11-05 ENCOUNTER — Other Ambulatory Visit: Payer: Self-pay

## 2018-11-05 ENCOUNTER — Encounter: Payer: Self-pay | Admitting: Family Medicine

## 2018-11-05 DIAGNOSIS — Z20822 Contact with and (suspected) exposure to covid-19: Secondary | ICD-10-CM

## 2018-11-09 LAB — NOVEL CORONAVIRUS, NAA: SARS-CoV-2, NAA: NOT DETECTED

## 2018-11-30 ENCOUNTER — Other Ambulatory Visit (HOSPITAL_COMMUNITY): Payer: Self-pay | Admitting: Psychiatry

## 2018-11-30 ENCOUNTER — Telehealth (HOSPITAL_COMMUNITY): Payer: Self-pay | Admitting: *Deleted

## 2018-11-30 MED ORDER — VORTIOXETINE HBR 20 MG PO TABS: 20.0000 mg | ORAL_TABLET | Freq: Every day | ORAL | 2 refills | Status: DC

## 2018-11-30 NOTE — Telephone Encounter (Signed)
sent 

## 2018-11-30 NOTE — Telephone Encounter (Signed)
PATIENT REQUESTING REFILL ON TRINTELLIX . SEND TO [facility]

## 2018-12-01 ENCOUNTER — Encounter: Payer: Self-pay | Admitting: Family Medicine

## 2018-12-01 LAB — COMPLETE METABOLIC PANEL WITH GFR
AG Ratio: 1.4 (calc) (ref 1.0–2.5)
ALT: 9 U/L (ref 6–29)
AST: 11 U/L (ref 10–30)
Albumin: 3.7 g/dL (ref 3.6–5.1)
Alkaline phosphatase (APISO): 75 U/L (ref 31–125)
BUN: 14 mg/dL (ref 7–25)
CO2: 19 mmol/L — ABNORMAL LOW (ref 20–32)
Calcium: 8.8 mg/dL (ref 8.6–10.2)
Chloride: 109 mmol/L (ref 98–110)
Creat: 0.75 mg/dL (ref 0.50–1.10)
GFR, Est African American: 118 mL/min/{1.73_m2} (ref >=60)
GFR, Est Non African American: 102 mL/min/{1.73_m2} (ref >=60)
Globulin: 2.7 g/dL (calc) (ref 1.9–3.7)
Glucose, Bld: 87 mg/dL (ref 65–99)
Potassium: 4.4 mmol/L (ref 3.5–5.3)
Sodium: 138 mmol/L (ref 135–146)
Total Bilirubin: 0.2 mg/dL (ref 0.2–1.2)
Total Protein: 6.4 g/dL (ref 6.1–8.1)

## 2018-12-01 LAB — LIPID PANEL
Cholesterol: 217 mg/dL — ABNORMAL HIGH (ref <200)
HDL: 57 mg/dL (ref >=50)
LDL Cholesterol (Calc): 124 mg/dL (calc) — ABNORMAL HIGH
Non-HDL Cholesterol (Calc): 160 mg/dL (calc) — ABNORMAL HIGH (ref <130)
Total CHOL/HDL Ratio: 3.8 (calc) (ref <5.0)
Triglycerides: 222 mg/dL — ABNORMAL HIGH (ref <150)

## 2018-12-01 LAB — CBC
HCT: 38 % (ref 35.0–45.0)
Hemoglobin: 12.6 g/dL (ref 11.7–15.5)
MCH: 29 pg (ref 27.0–33.0)
MCHC: 33.2 g/dL (ref 32.0–36.0)
MCV: 87.4 fL (ref 80.0–100.0)
MPV: 10.6 fL (ref 7.5–12.5)
Platelets: 335 10*3/uL (ref 140–400)
RBC: 4.35 10*6/uL (ref 3.80–5.10)
RDW: 13.3 % (ref 11.0–15.0)
WBC: 7.3 10*3/uL (ref 3.8–10.8)

## 2018-12-01 LAB — HEMOGLOBIN A1C
Hgb A1c MFr Bld: 5.3 % of total Hgb (ref <5.7)
Mean Plasma Glucose: 105 (calc)
eAG (mmol/L): 5.8 (calc)

## 2018-12-01 LAB — VITAMIN D 25 HYDROXY (VIT D DEFICIENCY, FRACTURES): Vit D, 25-Hydroxy: 41 ng/mL (ref 30–100)

## 2018-12-01 LAB — TSH: TSH: 1.77 mIU/L

## 2018-12-02 ENCOUNTER — Ambulatory Visit (INDEPENDENT_AMBULATORY_CARE_PROVIDER_SITE_OTHER): Payer: BC Managed Care – PPO | Admitting: Psychiatry

## 2018-12-02 ENCOUNTER — Other Ambulatory Visit (HOSPITAL_COMMUNITY)
Admission: RE | Admit: 2018-12-02 | Discharge: 2018-12-02 | Disposition: A | Discharge status: Home / Self Care | Payer: BC Managed Care – PPO | Source: Ambulatory Visit | Attending: Family Medicine | Admitting: Family Medicine

## 2018-12-02 ENCOUNTER — Ambulatory Visit (INDEPENDENT_AMBULATORY_CARE_PROVIDER_SITE_OTHER): Payer: BC Managed Care – PPO | Admitting: Family Medicine

## 2018-12-02 ENCOUNTER — Other Ambulatory Visit: Payer: Self-pay

## 2018-12-02 ENCOUNTER — Encounter: Payer: Self-pay | Admitting: Family Medicine

## 2018-12-02 VITALS — BP 128/70 | HR 88 | Temp 97.5°F | Resp 15 | Ht 63.0 in | Wt 331.0 lb

## 2018-12-02 DIAGNOSIS — Z124 Encounter for screening for malignant neoplasm of cervix: Secondary | ICD-10-CM | POA: Diagnosis not present

## 2018-12-02 DIAGNOSIS — R1013 Epigastric pain: Secondary | ICD-10-CM | POA: Diagnosis not present

## 2018-12-02 DIAGNOSIS — F321 Major depressive disorder, single episode, moderate: Secondary | ICD-10-CM

## 2018-12-02 DIAGNOSIS — D692 Other nonthrombocytopenic purpura: Secondary | ICD-10-CM | POA: Diagnosis not present

## 2018-12-02 DIAGNOSIS — Z0000 Encounter for general adult medical examination without abnormal findings: Secondary | ICD-10-CM | POA: Diagnosis not present

## 2018-12-02 MED ORDER — PHENTERMINE HCL 37.5 MG PO TABS: ORAL_TABLET | ORAL | 1 refills | Status: DC

## 2018-12-02 NOTE — Progress Notes (Signed)
Virtual Visit via Video Note  I connected with Claire [name] on 12/02/18 at  4:00 PM EDT by a video enabled telemedicine application and verified that I am speaking with the correct person using two identifiers.   I discussed the limitations of evaluation and management by telemedicine and the availability of in person appointments. The patient expressed understanding and agreed to proceed.   I provided 45 minutes of non-face-to-face time during this encounter.   Alonza Smoker, LCSW                               THERAPIST PROGRESS NOTE      Session Time:  Wednesday 12/02/2018 4:10 PM 4:55 PM   Participation Level: Active  Behavioral Response: CasualAlert/anxious  Type of Therapy: Individual Therapy  Treatment Goals addressed:   1. learn and implement behavioral strategies to overcome depression.      2. Learn and implement calming strategies to reduce/manage stress and anxiety.              3. Identify and replace thoughts and beliefs that support depression and anxiety.               Interventions: CBT/Supportive  Summary: Claire [name] is a 38 y.o. female who presents with symptoms of depression that began about a year ago. She began taking antidepressants which helped initially and had to be increased. She recently began to experience low energy and poor motivation again and has been referred for therapy and medication management by PCP Dr. Tula Nakayama. 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 Patient reports feeling lilke an outcast in her family as her parents are moving to [place] where patient's brother and sister reside. Patient's current symptoms include excessive sleeping, low energy, poor motivation, feel alone, loss of interest, loss of libido, and depressed mood.  Patient last's contact was by virtual  visit via video 6 - 7 weeks ago. She reports less depressed mood since starting Trintellix as prescribed by psychiatrist Dr. Harrington Challenger.  However, she has been out of the medication for the past 3 to 4 days as she did not realize a refill was not available.  However, she has addressed and hopes to pick up the medication today.  She reports feeling different without medication.  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  She reports beginning to have more frequent headaches and expresses frustration about lack of motivation for self-care.  She saw her PCP today and reports being dissatisfied with her lab numbers and weight.    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, assisted patient problem solve ways to adjust to virtual instruction, assisted patient examine her thought patterns evoking increased anxiety and replace with more helpful thoughts, discussed lapse versus relapse  regarding depression, assisted patient identify early warning signs of depression and began to identify ways to intervene, assigned patient to review and complete handout on early warning signs/interventions (therapist will mail to patient), also asked patient to be prepared to use handout on mindfulness and the window of tolerance during next session  Plan: Return again in 2 weeks  Diagnosis: Axis I: MDD,     Axis II: Deferred    Adah Salvageeggy E Yamir Carignan, LCSW 12/02/2018

## 2018-12-02 NOTE — Patient Instructions (Addendum)
F/U with MD in 2 months, call if you need me sooner  H pylori test in next 2 weeks, NO alka selzer during this period   No CAFFEINE, STOP EATING AT 7 , AFTER THIS ONLY WATER   Start half phentermine daily, and you will receive a 1500 cal diet sheet  CLEAN home food environment please  It is important that you exercise regularly at least 30 minutes 5 times a week. If you develop chest pain, have severe difficulty breathing, or feel very tired, stop exercising immediately and seek medical attention  Get friends to help!  Think about what you will eat, plan ahead. Choose " clean, green, fresh or frozen" over canned, processed or packaged foods which are more sugary, salty and fatty. 70 to 75% of food eaten should be vegetables and fruit. Three meals at set times with snacks allowed between meals, but they must be fruit or vegetables. Aim to eat over a 12 hour period , example 7 am to 7 pm, and STOP after  your last meal of the day. Drink water,generally about 64 ounces per day, no other drink is as healthy. Fruit juice is best enjoyed in a healthy way, by EATING the fruit.

## 2018-12-05 NOTE — Assessment & Plan Note (Signed)

## 2018-12-05 NOTE — Progress Notes (Signed)
Claire [name]     MRN: [id number]      DOB: 14-Oct-1980  HPI: Patient is in for annual physical exam. C/o excessive weight gain, little to no exercise, wants help with weight loss Recent labs,  are reviewed. Immunization is reviewed , and  updated if needed.   PE: Pulse 88   Temp (!) 97.5 F (36.4 C) (Temporal)   Resp 15   Ht 5\' 3"  (1.6 m)   Wt (!) 331 lb (150.1 kg)   LMP 11/15/2018 (Exact Date)   SpO2 98%   BMI 58.63 kg/m   Pleasant  female, alert and oriented x 3, in no cardio-pulmonary distress. Afebrile. HEENT No facial trauma or asymetry. Sinuses non tender.  Extra occullar muscles intact, pupils equally reactive to light. External ears normal, tympanic membranes clear. Oropharynx moist, no exudate. Neck: supple, no adenopathy,JVD or thyromegaly.No bruits.  Chest: Clear to ascultation bilaterally.No crackles or wheezes. Non tender to palpation  Breast: No asymetry,no masses or lumps. No tenderness. No nipple discharge or inversion. No axillary or supraclavicular adenopathy  Cardiovascular system; Heart sounds normal,  S1 and  S2 ,no S3.  No murmur, or thrill. Apical beat not displaced Peripheral pulses normal.  Abdomen: Soft, non tender, no organomegaly or masses. No bruits. Bowel sounds normal. No guarding, tenderness or rebound.    GU: External genitalia normal female genitalia , normal female distribution of hair. No lesions. Urethral meatus normal in size, no  Prolapse, no lesions visibly  Present. Bladder non tender. Vagina pink and moist , with no visible lesions , discharge present . Adequate pelvic support no  cystocele or rectocele noted Cervix pink and appears healthy, no lesions or ulcerations noted, no discharge noted from os Uterus normal size, no adnexal masses, no cervical motion or adnexal tenderness.   Musculoskeletal exam: Full ROM of spine, hips , shoulders and knees. No deformity ,swelling or crepitus noted. No muscle wasting  or atrophy.   Neurologic: Cranial nerves 2 to 12 intact. Power, tone ,sensation and reflexes normal throughout. No disturbance in gait. No tremor.  Skin: Intact, no ulceration, erythema , scaling or rash noted. Pigmentation normal throughout  Psych; Normal mood and affect. Judgement and concentration normal   Assessment & Plan:  Annual physical exam Annual exam as documented. Counseling done  re healthy lifestyle involving commitment to 150 minutes exercise per week, heart healthy diet, and attaining healthy weight.The importance of adequate sleep also discussed. Regular seat belt use and home safety, is also discussed. Changes in health habits are decided on by the patient with goals and time frames  set for achieving them. Immunization and cancer screening needs are specifically addressed at this visit.   Morbid obesity  Patient re-educated about  the importance of commitment to a  minimum of 150 minutes of exercise per week as able.  The importance of healthy food choices with portion control discussed, as well as eating regularly and within a 12 hour window most days. The need to choose "clean , green" food 50 to 75% of the time is discussed, as well as to make water the primary drink and set a goal of 64 ounces water daily.    Weight /BMI 12/02/2018 07/06/2018 06/04/2018  WEIGHT 331 lb 298 lb 1.3 oz 294 lb 8 oz  HEIGHT 5\' 3"  5\' 3"  5\' 3"   BMI 58.63 kg/m2 52.8 kg/m2 52.17 kg/m2  Some encounter information is confidential and restricted. Go to Review Flowsheets activity to see all data.  Start half phentermine daily

## 2018-12-05 NOTE — Assessment & Plan Note (Signed)
  Patient re-educated about  the importance of commitment to a  minimum of 150 minutes of exercise per week as able.  The importance of healthy food choices with portion control discussed, as well as eating regularly and within a 12 hour window most days. The need to choose "clean , green" food 50 to 75% of the time is discussed, as well as to make water the primary drink and set a goal of 64 ounces water daily.    Weight /BMI 12/02/2018 07/06/2018 06/04/2018  WEIGHT 331 lb 298 lb 1.3 oz 294 lb 8 oz  HEIGHT 5\' 3"  5\' 3"  5\' 3"   BMI 58.63 kg/m2 52.8 kg/m2 52.17 kg/m2  Some encounter information is confidential and restricted. Go to Review Flowsheets activity to see all data.  Start half phentermine daily

## 2018-12-08 ENCOUNTER — Encounter: Payer: Self-pay | Admitting: Family Medicine

## 2018-12-08 LAB — CYTOLOGY - PAP
Adequacy: ABSENT
Diagnosis: NEGATIVE

## 2018-12-17 ENCOUNTER — Ambulatory Visit (INDEPENDENT_AMBULATORY_CARE_PROVIDER_SITE_OTHER): Payer: BC Managed Care – PPO | Admitting: Psychiatry

## 2018-12-17 ENCOUNTER — Other Ambulatory Visit: Payer: Self-pay

## 2018-12-17 DIAGNOSIS — F321 Major depressive disorder, single episode, moderate: Secondary | ICD-10-CM | POA: Diagnosis not present

## 2018-12-17 NOTE — Progress Notes (Signed)
Virtual Visit via Telephone Note  I connected with Claire [name] on 12/17/18 at 4:20 PM EDT by telephone and verified that I am speaking with the correct person using two identifiers.   I discussed the limitations, risks, security and privacy concerns of performing an evaluation and management service by telephone and the availability of in person appointments. I also discussed with the patient that there may be a patient responsible charge related to this service. The patient expressed understanding and agreed to proceed.  I provided 35 minutes of non-face-to-face time during this encounter.   Alonza Smoker, LCSW                               THERAPIST PROGRESS NOTE      Session Time:  Thursday 12/17/2018 4:20 PM - 4:55 PM   Participation Level: Active  Behavioral Response: CasualAlert/anxious  Type of Therapy: Individual Therapy  Treatment Goals addressed:   1. learn and implement behavioral strategies to overcome depression.      2. Learn and implement calming strategies to reduce/manage stress and anxiety.              3. Identify and replace thoughts and beliefs that support depression and anxiety.               Interventions: CBT/Supportive  Summary: Claire [name] is a 38 y.o. female who presents with symptoms of depression that began about a year ago. She began taking antidepressants which helped initially and had to be increased. She recently began to experience low energy and poor motivation again and has been referred for therapy and medication management by PCP Dr. Tula Nakayama. 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 Patient reports feeling lilke an outcast in her family as her parents are moving to [place] where patient's brother and sister reside. Patient's current symptoms include excessive sleeping, low  energy, poor motivation, feel alone, loss of interest, loss of libido, and depressed mood.  Patient last's contact was by virtual visit about 2-3 weeks ago. She reports increased stress related to resuming her job. She is pleased she has been able to establish good rapport and connect with students. She reports recently receiving a compliment from a parent about this. However, she reports demands of job are stressful due to technology issues and expectations regarding workload. She reports trying to make certain she keeps positive sleep pattern to ensure she has enough rest.   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 She is pleased she has lost 5 pounds since last session. She is having difficulty finding balance and reports little to no involvement in other activities besides work.  Suicidal/Homicidal: No  Therapist Response:  Reviewed symptoms, praised and reinforced patient's efforts in adapting to virtual instruction and building rapport with students, reviewed connection between thoughts/feelings/and behavior, discussed stressors, facilitated expression of thoughts and feelings, validated feelings, assisted patient identify realistic expectations of self, assisted patient identify ways to achieve balance between responsibilities/job//self-care/pleasurable activities, developed plan with patient for to visit her family this weekend, discussed possible benefits of visit,  agreed to discuss mindfulness and the window of tolerance during next session  Plan: Return again in 2 weeks  Diagnosis: Axis I: MDD,  Axis II: Deferred    Claire Salvageeggy E Kamori Kitchens, LCSW 12/17/2018

## 2018-12-30 ENCOUNTER — Ambulatory Visit (HOSPITAL_COMMUNITY): Payer: BC Managed Care – PPO | Admitting: Psychiatry

## 2018-12-30 ENCOUNTER — Other Ambulatory Visit: Payer: Self-pay

## 2018-12-30 DIAGNOSIS — F321 Major depressive disorder, single episode, moderate: Secondary | ICD-10-CM

## 2018-12-30 NOTE — Progress Notes (Signed)
Virtual Visit via Video Note  I connected with Claire [name] on 12/30/18 at 4:15 PM EDT by a video enabled telemedicine application and verified that I am speaking with the correct person using two identifiers.   I discussed the limitations of evaluation and management by telemedicine and the availability of in person appointments. The patient expressed understanding and agreed to proceed.    I provided 35 minutes of non-face-to-face time during this encounter.   Alonza Smoker, LCSW                               THERAPIST PROGRESS NOTE      Session Time:  Wednesday 12/30/2018 4:15 PM - 4:50 PM   Participation Level: Active  Behavioral Response: CasualAlert/anxious  Type of Therapy: Individual Therapy  Treatment Goals addressed:   1. learn and implement behavioral strategies to overcome depression.      2. Learn and implement calming strategies to reduce/manage stress and anxiety.              3. Identify and replace thoughts and beliefs that support depression and anxiety.               Interventions: CBT/Supportive  Summary: Claire [name] is a 38 y.o. female who presents with symptoms of depression that began about a year ago. She began taking antidepressants which helped initially and had to be increased. She recently began to experience low energy and poor motivation again and has been referred for therapy and medication management by PCP Dr. Tula Nakayama. 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 Patient reports feeling lilke an outcast in her family as her parents are moving to [place] where patient's brother and sister reside. Patient's current symptoms include excessive sleeping, low energy, poor motivation, feel alone, loss of interest, loss of libido, and depressed mood.  Patient last's contact was by virtual  visit about 2-3 weeks ago. She reports continued stress regarding her job. 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  She reports feeling scared due to concerns about COVID-19 and feeling overrwhelmed due to the increased work demands. She reports experiencing increased migraines and being too tired to do anything when she arrives home from work. She attributes migraines partially to use of computer. She did visit her family in [place] 2 weeks ago and reports enjoying the visit. She is pleased she focused on the visit and did not do any work related to school. She plans to visit family again this weekend. Suicidal/Homicidal: [name]  Therapist Response:  Reviewed symptoms, praised and reinforced patient's efforts to spend time with family,discussed effects, discussed stressors,  facilitated expression of thoughts and feelings, validated feelings, discussed areas within patient's control regarding being safe in the classroom, assisted patient with problem solving and discussed ways to use computer in a way that is ergonomically better for patient, reviewed the role of self-care in managing stress particularly exercise, developed plan with patient to follow up with sister regarding her offer of an exercise program and do exercise program at home in the mornings 2-3 days per week,  Plan: Return again in 2 weeks  Diagnosis: Axis I: MDD,     Axis II: Deferred    Adah Salvageeggy E Yesennia Hirota, LCSW 12/30/2018

## 2019-01-27 ENCOUNTER — Ambulatory Visit (INDEPENDENT_AMBULATORY_CARE_PROVIDER_SITE_OTHER): Payer: BC Managed Care – PPO | Admitting: Family Medicine

## 2019-01-27 ENCOUNTER — Ambulatory Visit: Payer: BC Managed Care – PPO | Admitting: Family Medicine

## 2019-01-27 ENCOUNTER — Other Ambulatory Visit: Payer: Self-pay

## 2019-01-27 ENCOUNTER — Encounter: Payer: Self-pay | Admitting: Family Medicine

## 2019-01-27 VITALS — BP 128/70 | Ht 64.0 in | Wt 328.0 lb

## 2019-01-27 DIAGNOSIS — G43709 Chronic migraine without aura, not intractable, without status migrainosus: Secondary | ICD-10-CM | POA: Diagnosis not present

## 2019-01-27 DIAGNOSIS — IMO0002 Reserved for concepts with insufficient information to code with codable children: Secondary | ICD-10-CM

## 2019-01-27 DIAGNOSIS — F321 Major depressive disorder, single episode, moderate: Secondary | ICD-10-CM

## 2019-01-27 MED ORDER — PHENTERMINE HCL 37.5 MG PO TABS: ORAL_TABLET | ORAL | 1 refills | Status: DC

## 2019-01-27 NOTE — Assessment & Plan Note (Signed)
  Patient re-educated about  the importance of commitment to a  minimum of 150 minutes of exercise per week as able.  The importance of healthy food choices with portion control discussed, as well as eating regularly and within a 12 hour window most days. The need to choose "clean , green" food 50 to 75% of the time is discussed, as well as to make water the primary drink and set a goal of 64 ounces water daily.    Weight /BMI 01/27/2019 12/02/2018 07/06/2018  WEIGHT 328 lb 331 lb 298 lb 1.3 oz  HEIGHT 5\' 4"  5\' 3"  5\' 3"   BMI 56.3 kg/m2 58.63 kg/m2 52.8 kg/m2  Some encounter information is confidential and restricted. Go to Review Flowsheets activity to see all data.

## 2019-01-27 NOTE — Assessment & Plan Note (Signed)
Managed by psychiatry, reports some improvement

## 2019-01-27 NOTE — Assessment & Plan Note (Signed)
Improved control on current regime, continue same

## 2019-01-27 NOTE — Patient Instructions (Signed)
Phone f/u in 8 weeks with MD call if you need me sooner  Please be consistent in taking medication and control food environment at home more so that less chance of eating unhealthy food  It is important that you exercise regularly at least 30 minutes 5 times a week. If you develop chest pain, have severe difficulty breathing, or feel very tired, stop exercising immediately and seek medical attention   KEEP PUSHING on, 3 pounds down!  Thanks for choosing [facility], we consider it a privelige to serve you.

## 2019-01-27 NOTE — Progress Notes (Signed)
Virtual Visit via Telephone Note  I connected with Claire [name] on 01/27/19 at  8:40 AM EDT by telephone and verified that I am speaking with the correct person using two identifiers.  Location: Patient: home Provider: office   I discussed the limitations, risks, security and privacy concerns of performing an evaluation and management service by telephone and the availability of in person appointments. I also discussed with the patient that there may be a patient responsible charge related to this service. The patient expressed understanding and agreed to proceed.   History of Present Illness:    Observations/Objective: BP 128/70   Ht 5\' 4"  (1.626 m)   Wt (!) 328 lb (148.8 kg)   BMI 56.30 kg/m   Good communication with no confusion and intact memory. Alert and oriented x 3 No signs of respiratory distress during speech    Assessment and Plan: Morbid obesity  Patient re-educated about  the importance of commitment to a  minimum of 150 minutes of exercise per week as able.  The importance of healthy food choices with portion control discussed, as well as eating regularly and within a 12 hour window most days. The need to choose "clean , green" food 50 to 75% of the time is discussed, as well as to make water the primary drink and set a goal of 64 ounces water daily.    Weight /BMI 01/27/2019 12/02/2018 07/06/2018  WEIGHT 328 lb 331 lb 298 lb 1.3 oz  HEIGHT 5\' 4"  5\' 3"  5\' 3"   BMI 56.3 kg/m2 58.63 kg/m2 52.8 kg/m2  Some encounter information is confidential and restricted. Go to Review Flowsheets activity to see all data.      Chronic migraine Improved control on current regime, continue same  Depression Managed by psychiatry, reports some improvement    Follow Up Instructions:    I discussed the assessment and treatment plan with the patient. The patient was provided an opportunity to ask questions and all were answered. The patient agreed with the plan and  demonstrated an understanding of the instructions.   The patient was advised to call back or seek an in-person evaluation if the symptoms worsen or if the condition fails to improve as anticipated.  I provided 15 minutes of non-face-to-face time during this encounter.   Tula Nakayama, MD

## 2019-02-02 ENCOUNTER — Ambulatory Visit (HOSPITAL_COMMUNITY): Payer: BC Managed Care – PPO | Admitting: Psychiatry

## 2019-02-24 ENCOUNTER — Other Ambulatory Visit: Payer: Self-pay

## 2019-02-24 DIAGNOSIS — Z20822 Contact with and (suspected) exposure to covid-19: Secondary | ICD-10-CM

## 2019-02-26 LAB — NOVEL CORONAVIRUS, NAA: SARS-CoV-2, NAA: NOT DETECTED

## 2019-03-03 ENCOUNTER — Other Ambulatory Visit: Payer: Self-pay

## 2019-03-03 ENCOUNTER — Encounter (HOSPITAL_COMMUNITY): Payer: Self-pay | Admitting: Psychiatry

## 2019-03-03 ENCOUNTER — Ambulatory Visit (INDEPENDENT_AMBULATORY_CARE_PROVIDER_SITE_OTHER): Payer: BC Managed Care – PPO | Admitting: Psychiatry

## 2019-03-03 DIAGNOSIS — F321 Major depressive disorder, single episode, moderate: Secondary | ICD-10-CM

## 2019-03-03 MED ORDER — VENLAFAXINE HCL ER 150 MG PO CP24: 300.0000 mg | ORAL_CAPSULE | Freq: Every day | ORAL | 2 refills | Status: DC

## 2019-03-03 MED ORDER — VORTIOXETINE HBR 10 MG PO TABS: 10.0000 mg | ORAL_TABLET | Freq: Every day | ORAL | 2 refills | Status: DC

## 2019-03-03 MED ORDER — VORTIOXETINE HBR 20 MG PO TABS: 20.0000 mg | ORAL_TABLET | Freq: Every day | ORAL | 2 refills | Status: DC

## 2019-03-03 NOTE — Progress Notes (Signed)
Virtual Visit via Video Note  I connected with Claire [name] on 03/03/19 at  3:40 PM EST by a video enabled telemedicine application and verified that I am speaking with the correct person using two identifiers.   I discussed the limitations of evaluation and management by telemedicine and the availability of in person appointments. The patient expressed understanding and agreed to proceed.     I discussed the assessment and treatment plan with the patient. The patient was provided an opportunity to ask questions and all were answered. The patient agreed with the plan and demonstrated an understanding of the instructions.   The patient was advised to call back or seek an in-person evaluation if the symptoms worsen or if the condition fails to improve as anticipated.  I provided 15 minutes of non-face-to-face time during this encounter.   Levonne Spiller, MD  [facility] [place] MD/PA/NP OP Progress Note  03/03/2019 3:54 PM Claire [name]  MRN:  [id number]  Chief Complaint:  Chief Complaint    Depression; Follow-up     HPI: This patient is a 38 year old single white female who lives alone in [place]. 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  The patient was initially referred by Dr. Tula Nakayama for further assessment and treatment of depression. She has been seeing Maurice Small for several months in our office for counseling.  The patient states that she had one episode of depression previously about 10 years ago. This was during a time in which she was unemployed but she did not receive any specific treatment. She states that she had been doing well until approximately 2 years ago. At this time her sister's child was born and she felt like her parents attention was focused more for herself to the new baby. Her brother is since had a child as well. She is the only one in the family has not married it and does not have children and this makes her feel bad. Her job is also been  very stressful at times and she is under a lot of pressure because of test score expectations for her students.  The patient's depression worsened over time and her primary Dr. put her on Effexor XR and the doses gradually been increased to 150 mg every morning. She states that each time its increase she feels better for about a month and then she feels worse again. Current symptoms include anhedonia, hypersomnia, feeling fatigue and low energy low motivation and occasional crying. She states that she has no interest in sex or dating. She spends most of her wake and sleeping. She talks on the phone to friends but has no interest in getting together. Most of her social life is with her family. She denies suicidal ideation or psychotic symptoms. She does not use drugs or alcohol she's not been the victim of any trauma or abuse either in childhood or as an adult. She states that she eats too much and has gained quite a bit of weight recently and does not get any exercise her health is good except for chronic migraines which have improved with a combination of Topamax and Maxalt  The patient returns after 4 months.  She states that she has been more depressed lately.  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  Next week her school is going back to fully virtual and she will have to teach everything from the school to kids at home  which is difficult given the intricacies of teaching the math.  She is also had problems with finances.  Her brother-in-law got Covid and she was worried about exposure and she also had an exposure at school.  Fortunately she has had 2 Covid tests that are negative.  She states that lately her energy has been low and she has regained the weight that she lost last year.  She does not have much motivation or energy to get off the couch and sometimes sleeps too much on the weekends.  She is working with Florencia Reasons here and trying to get herself  more on a schedule.  She denies any thoughts of self-harm or suicide.  I suggested we go up a little bit on the Trintellix and she is willing to give this a try. Visit Diagnosis:    ICD-10-CM   1. Moderate single current episode of major depressive disorder (HCC)  F32.1 venlafaxine XR (EFFEXOR-XR) 150 MG 24 hr capsule    Past Psychiatric History: none  Past Medical History:  Past Medical History:  Diagnosis Date  . Acne    facial  . Arthritis   . Migraines   . Nicotine addiction   . Obesity     Past Surgical History:  Procedure Laterality Date  . CHOLECYSTECTOMY      Family Psychiatric History: see below  Family History:  Family History  Problem Relation Age of Onset  . Hypertension Mother   . Cancer Mother 47       colon  . Obesity Mother   . Hypertension Father   . Diabetes Father   . Hyperlipidemia Father   . Obesity Father     Social History:  Social History   Socioeconomic History  . Marital status: Single    Spouse name: Not on file  . Number of children: 0  . Years of education: Bachelors  . Highest education level: Not on file  Occupational History  . 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 Financial resource strain: Not on file  . Food insecurity    Worry: Not on file    Inability: Not on file  . Transportation needs    Medical: Not on file    Non-medical: Not on file  Tobacco Use  . Smoking status: Former Smoker    Quit date: 11/13/2008    Years since quitting: 10.3  . Smokeless tobacco: Never Used  . Tobacco comment: Quit 2008, 04-02-2016 per pt stopped 2010  Substance and Sexual Activity  . Alcohol use: No    Alcohol/week: 0.0 standard drinks    Comment: occasional use  a beer once every 3 months or so, 04-02-2016 per pt occas.   . Drug use: No    Comment: 04-02-2016 per pt no  . Sexual activity: Not Currently    Birth control/protection: Pill  Lifestyle  . Physical activity    Days per week: Not on file    Minutes per session:  Not on file  . Stress: Not on file  Relationships  . Social Musician on phone: Not on file    Gets together: Not on file    Attends religious service: Not on file    Active member of club or organization: Not on file    Attends meetings of clubs or organizations: Not on file    Relationship status: Not on file  Other Topics Concern  . Not on file  Social History Narrative   Lives at  home alone.   Right-handed.   Occasional use of caffeine.    Allergies: No Known Allergies  Metabolic Disorder Labs: Lab Results  Component Value Date   HGBA1C 5.3 11/30/2018   MPG 105 11/30/2018   MPG 103 11/06/2016   No results found for: PROLACTIN Lab Results  Component Value Date   CHOL 217 (H) 11/30/2018   TRIG 222 (H) 11/30/2018   HDL 57 11/30/2018   CHOLHDL 3.8 11/30/2018   VLDL 31 (H) 11/06/2016   LDLCALC 124 (H) 11/30/2018   LDLCALC 125 (H) 11/04/2017   Lab Results  Component Value Date   TSH 1.77 11/30/2018   TSH 2.55 11/04/2017    Therapeutic Level Labs: No results found for: LITHIUM No results found for: VALPROATE No components found for:  CBMZ  Current Medications: Current Outpatient Medications  Medication Sig Dispense Refill  . Glucosamine HCl (GLUCOSAMINE PO) Take by mouth daily.    Marland Kitchen ibuprofen (ADVIL) 800 MG tablet Take one tablet by mouth, twice daily , if needed , for severe headache , along with immitrex 30 tablet 1  . loratadine (CLARITIN) 10 MG tablet TAKE ONE TABLET BY MOUTH ONCE DAILY 90 tablet 3  . Multiple Vitamin (MULTIVITAMIN) tablet Take 1 tablet by mouth daily.      . Norgestimate-Ethinyl Estradiol Triphasic (TRI-SPRINTEC) 0.18/0.215/0.25 MG-35 MCG tablet Take 1 tablet by mouth daily. 3 Package 3  . pantoprazole (PROTONIX) 40 MG tablet Take one tablet 2 times daily for 2 days, when you take ibuprofen for headache 60 tablet 1  . phentermine (ADIPEX-P) 37.5 MG tablet Take half tablet once daily every morning at breakfast 15 tablet 1  .  rizatriptan (MAXALT-MLT) 10 MG disintegrating tablet Take 1 tablet (10 mg total) by mouth as needed. May repeat in 2 hours if needed 12 tablet 11  . topiramate (TOPAMAX) 100 MG tablet Take one tablet by mouth twice daily.  No further refills until seen. 180 tablet 4  . venlafaxine XR (EFFEXOR-XR) 150 MG 24 hr capsule Take 2 capsules (300 mg total) by mouth daily with breakfast. 90 capsule 2  . vortioxetine HBr (TRINTELLIX) 10 MG TABS tablet Take 1 tablet (10 mg total) by mouth daily. 30 tablet 2  . vortioxetine HBr (TRINTELLIX) 20 MG TABS tablet Take 1 tablet (20 mg total) by mouth daily. 30 tablet 2   No current facility-administered medications for this visit.      Musculoskeletal: Strength & Muscle Tone: within normal limits Gait & Station: normal Patient leans: N/A  Psychiatric Specialty Exam: Review of Systems  Constitutional: Positive for malaise/fatigue.  Psychiatric/Behavioral: Positive for depression.  All other systems reviewed and are negative.   There were no vitals taken for this visit.There is no height or weight on file to calculate BMI.  General Appearance: Casual and Fairly Groomed  Eye Contact:  Good  Speech:  Clear and Coherent  Volume:  Normal  Mood:  Dysphoric  Affect:  Constricted and Flat  Thought Process:  Goal Directed  Orientation:  Full (Time, Place, and Person)  Thought Content: Rumination   Suicidal Thoughts:  No  Homicidal Thoughts:  No  Memory:  Immediate;   Good Recent;   Good Remote;   Fair  Judgement:  Good  Insight:  Good  Psychomotor Activity:  Decreased  Concentration:  Concentration: Good and Attention Span: Good  Recall:  Good  Fund of Knowledge: Good  Language: Good  Akathisia:  No  Handed:  Right  AIMS (if indicated): not done  Assets:  Communication Skills Desire for Improvement Physical Health Resilience Social Support Talents/Skills Vocational/Educational  ADL's:  Intact  Cognition: WNL  Sleep:  Good    Screenings: PHQ2-9     [facility]02/2018 in [facility]  PHQ-2 Total Score  3  1  4  1  2   PHQ-9 Total Score  6  -  13  -  9       Assessment and Plan: This patient is a 38 year old female with a history of depression.  She seems to be in a slump again due to all the stressors related to her job during the coronavirus pandemic.  For now we will increase Trintellix to 30 mg daily for depression and continue Effexor XR 300 mg daily also for depression.  She will return to see me in 4 weeks   Diannia Rudereborah Ross, MD 03/03/2019, 3:54 PM

## 2019-03-14 ENCOUNTER — Encounter: Payer: Self-pay | Admitting: Family Medicine

## 2019-03-15 ENCOUNTER — Other Ambulatory Visit: Payer: Self-pay | Admitting: Family Medicine

## 2019-03-15 MED ORDER — MECLIZINE HCL 25 MG PO TABS: 25.0000 mg | ORAL_TABLET | Freq: Three times a day (TID) | ORAL | 0 refills | Status: DC | PRN

## 2019-03-15 NOTE — Progress Notes (Signed)
antivert

## 2019-03-16 ENCOUNTER — Other Ambulatory Visit: Payer: Self-pay

## 2019-03-16 ENCOUNTER — Encounter: Payer: Self-pay | Admitting: Family Medicine

## 2019-03-16 ENCOUNTER — Ambulatory Visit (INDEPENDENT_AMBULATORY_CARE_PROVIDER_SITE_OTHER): Payer: BC Managed Care – PPO | Admitting: Family Medicine

## 2019-03-16 VITALS — BP 128/70 | Ht 63.0 in | Wt 337.0 lb

## 2019-03-16 DIAGNOSIS — IMO0002 Reserved for concepts with insufficient information to code with codable children: Secondary | ICD-10-CM

## 2019-03-16 DIAGNOSIS — G43709 Chronic migraine without aura, not intractable, without status migrainosus: Secondary | ICD-10-CM | POA: Diagnosis not present

## 2019-03-16 DIAGNOSIS — R42 Dizziness and giddiness: Secondary | ICD-10-CM | POA: Diagnosis not present

## 2019-03-16 DIAGNOSIS — Z20828 Contact with and (suspected) exposure to other viral communicable diseases: Secondary | ICD-10-CM | POA: Diagnosis not present

## 2019-03-16 DIAGNOSIS — Z20822 Contact with and (suspected) exposure to covid-19: Secondary | ICD-10-CM

## 2019-03-16 NOTE — Patient Instructions (Addendum)
Reschedule Nov appt to early January,,  Call if you need me before  Please get covid test tomorrow at hospital due to close contact with positive  Case this past weekend  Try to eat and drink regularly and get back on medcationas prescribed, no phentermine now  Work excuse from 11/16 to return 03/22/2019, pls send messag to nurse for fax number for work excuse

## 2019-03-16 NOTE — Progress Notes (Signed)
Virtual Visit via Telephone Note  I connected with VIRGIN ZELLERS on 03/16/19 at  3:20 PM EST by telephone and verified that I am speaking with the correct person using two identifiers.  Location: Patient: home Provider: office    I discussed the limitations, risks, security and privacy concerns of performing an evaluation and management service by telephone and the availability of in person appointments. I also discussed with the patient that there may be a patient responsible charge related to this service. The patient expressed understanding and agreed to proceed.   History of Present Illness: No dizziness since yesterday morning despite not taking any medication New headaches since lunchtime RATED AT A 2 TO 3, sharp left forehead pain ,then over entire head,  has had nothing to eat since 1 and feels weak. No localizing neurologic complaintbeen having generalized aching, denies cough or myalgias, no fever or chills. Recent close contact with covid positive family member   Observations/Objective: BP 128/70   Ht 5\' 3"  (1.6 m)   Wt (!) 337 lb (152.9 kg)   BMI 59.70 kg/m  Good communication sounds excessivly tired, yawning frequently and intact memory. Alert and oriented x 3 No signs of respiratory distress during speech    Assessment and Plan: Headache Improved ,in past 24 hours, no more vertigo, however still  Present, accompanied by weakness and poor appetite with recent covid exposure. Work excuse x 1 week. Definitive migraine management , and folow up covid test result, remain in isolation until negative result is available  Vertigo Acute vertigo for less than 24 hours without medication. likley related to hadache  Chronic migraine acute headache x 24 hours , improving despite lack of definite abortive therapy, needs to remain home bound due to consterantion of symptoms and recent covid exposure  Close exposure to COVID-19 virus Unknowingly exposed to covid positive  relative in past 2 days, now acute symptom onset. Needs testing with negative result before returning to work, will follow through    Follow Up Instructions:    I discussed the assessment and treatment plan with the patient. The patient was provided an opportunity to ask questions and all were answered. The patient agreed with the plan and demonstrated an understanding of the instructions.   The patient was advised to call back or seek an in-person evaluation if the symptoms worsen or if the condition fails to improve as anticipated.  I provided 15  minutes of non-face-to-face time during this encounter.   Tula Nakayama, MD

## 2019-03-18 ENCOUNTER — Ambulatory Visit (INDEPENDENT_AMBULATORY_CARE_PROVIDER_SITE_OTHER): Payer: BC Managed Care – PPO | Admitting: Psychiatry

## 2019-03-18 ENCOUNTER — Other Ambulatory Visit: Payer: Self-pay

## 2019-03-18 DIAGNOSIS — F321 Major depressive disorder, single episode, moderate: Secondary | ICD-10-CM

## 2019-03-18 DIAGNOSIS — Z20822 Contact with and (suspected) exposure to covid-19: Secondary | ICD-10-CM

## 2019-03-18 NOTE — Progress Notes (Signed)
Virtual Visit via Video Note  I connected with Claire [name] on 03/18/19 at 4:10 PM  by a video enabled telemedicine application and verified that I am speaking with the correct person using two identifiers.   I discussed the limitations of evaluation and management by telemedicine and the availability of in person appointments. The patient expressed understanding and agreed to proceed.  I provided 35 minutes of non-face-to-face time during this encounter.   [name], LCSW                              THERAPIST PROGRESS NOTE      Session Time: Thursday 03/18/2019 4:10 PM -  4:45 PM    Participation Level: Active  Behavioral Response: CasualAlert/anxious  Type of Therapy: Individual Therapy  Treatment Goals addressed:   1. learn and implement behavioral strategies to overcome depression.      2. Learn and implement calming strategies to reduce/manage stress and anxiety.              3. Identify and replace thoughts and beliefs that support depression and anxiety.               Interventions: CBT/Supportive  Summary: Claire [name] is a 38 y.o. female who presents with symptoms of depression that began about a year ago. She began taking antidepressants which helped initially and had to be increased. She recently began to experience low energy and poor motivation again and has been referred for therapy and medication management by PCP Dr. Syliva Overman. 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 Patient reports feeling lilke an outcast in her family as her parents are moving to [place] where patient's brother and sister reside. Patient's current symptoms include excessive sleeping, low energy, poor motivation, feel alone, loss of interest, loss of libido, and depressed mood.  Patient last's contact was by virtual visit  in September 2020.  She reports school year has been very stressful due to impact of Covid and going back and forth between in class and virtual instruction.    About 2 to 3 weeks ago, she reports she became very depressed and overwhelmed.  However she reports feeling better now as she recognized her negative thought patterns and began to use helpful coping statements.  She currently is on medical leave for the week as she has been experiencing dizzy spells and weakness.  She is has worked with PCP who attributed symptoms to patient going a day without all of her medications.  Patient reports this happened as she stayed out of town with her parents a day longer than she had anticipated and did not have her medications with her at the time.  She reports she is recovering well.  However, she expresses concern as her father was diagnosed with Covid this past Monday and patient was around him prior to his diagnosis.  Patient reports she was tested this morning but will not get the results for 2 or 3 days.  She expresses sadness but acceptance she may be alone at her home for Thanksgiving.  She expresses appropriate concern about her dad.  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  Suicidal/Homicidal: No  Therapist Response:  Reviewed symptoms, praised and reinforced patient's  recognition of warning signs of depression and her successful use of interventions to cope, discussed stressors, facilitated expression of thoughts and feelings, validated feelings, assisted patient identify ways to cope with upcoming holiday through behavioral activation and use of coping statements, also discussed ways patient can maintain connection and contact with family in safe way, encouraged patient to resume self-care efforts efforts  Plan: Return again in 2 weeks  Diagnosis: Axis I: MDD,     Axis II: Deferred    Alonza Smoker, LCSW 03/18/2019

## 2019-03-21 ENCOUNTER — Encounter: Payer: Self-pay | Admitting: Family Medicine

## 2019-03-21 DIAGNOSIS — Z20828 Contact with and (suspected) exposure to other viral communicable diseases: Secondary | ICD-10-CM | POA: Insufficient documentation

## 2019-03-21 DIAGNOSIS — R519 Headache, unspecified: Secondary | ICD-10-CM | POA: Insufficient documentation

## 2019-03-21 DIAGNOSIS — R42 Dizziness and giddiness: Secondary | ICD-10-CM | POA: Insufficient documentation

## 2019-03-21 DIAGNOSIS — Z8616 Personal history of COVID-19: Secondary | ICD-10-CM | POA: Insufficient documentation

## 2019-03-21 LAB — NOVEL CORONAVIRUS, NAA: SARS-CoV-2, NAA: NOT DETECTED

## 2019-03-21 NOTE — Assessment & Plan Note (Signed)
Acute vertigo for less than 24 hours without medication. likley related to [employer]

## 2019-03-21 NOTE — Assessment & Plan Note (Signed)
Unknowingly exposed to covid positive relative in past 2 days, now acute symptom onset. Needs testing with negative result before returning to work, will follow through

## 2019-03-21 NOTE — Assessment & Plan Note (Signed)
acute headache x 24 hours , improving despite lack of definite abortive therapy, needs to remain home bound due to consterantion of symptoms and recent covid exposure

## 2019-03-21 NOTE — Assessment & Plan Note (Signed)
Improved ,in past 24 hours, no more vertigo, however still  Present, accompanied by weakness and poor appetite with recent covid exposure. Work excuse x 1 week. Definitive migraine management , and folow up covid test result, remain in isolation until negative result is available

## 2019-03-22 ENCOUNTER — Encounter: Payer: Self-pay | Admitting: Family Medicine

## 2019-03-29 ENCOUNTER — Ambulatory Visit: Payer: BC Managed Care – PPO | Admitting: Family Medicine

## 2019-04-05 ENCOUNTER — Ambulatory Visit (INDEPENDENT_AMBULATORY_CARE_PROVIDER_SITE_OTHER): Payer: BC Managed Care – PPO | Admitting: Psychiatry

## 2019-04-05 ENCOUNTER — Encounter (HOSPITAL_COMMUNITY): Payer: Self-pay | Admitting: Psychiatry

## 2019-04-05 ENCOUNTER — Other Ambulatory Visit: Payer: Self-pay

## 2019-04-05 DIAGNOSIS — F321 Major depressive disorder, single episode, moderate: Secondary | ICD-10-CM

## 2019-04-05 DIAGNOSIS — Z79899 Other long term (current) drug therapy: Secondary | ICD-10-CM

## 2019-04-05 MED ORDER — VORTIOXETINE HBR 10 MG PO TABS: 10.0000 mg | ORAL_TABLET | Freq: Every day | ORAL | 2 refills | Status: DC

## 2019-04-05 MED ORDER — VENLAFAXINE HCL ER 150 MG PO CP24: 300.0000 mg | ORAL_CAPSULE | Freq: Every day | ORAL | 2 refills | Status: DC

## 2019-04-05 MED ORDER — VORTIOXETINE HBR 20 MG PO TABS: 20.0000 mg | ORAL_TABLET | Freq: Every day | ORAL | 2 refills | Status: DC

## 2019-04-05 NOTE — Progress Notes (Signed)
Virtual Visit via Telephone Note  I connected with Claire [name] on 04/05/19 at  3:40 PM EST by telephone and verified that I am speaking with the correct person using two identifiers.   I discussed the limitations, risks, security and privacy concerns of performing an evaluation and management service by telephone and the availability of in person appointments. I also discussed with the patient that there may be a patient responsible charge related to this service. The patient expressed understanding and agreed to proceed.    I discussed the assessment and treatment plan with the patient. The patient was provided an opportunity to ask questions and all were answered. The patient agreed with the plan and demonstrated an understanding of the instructions.   The patient was advised to call back or seek an in-person evaluation if the symptoms worsen or if the condition fails to improve as anticipated.  I provided 15 minutes of non-face-to-face time during this encounter.   Diannia Rudereborah Froylan Hobby, MD  [facility]/NP OP Progress Note  04/05/2019 3:52 PM Claire GroveSheila M Schlechter  MRN:  [id number]  Chief Complaint:  Chief Complaint    Depression; Follow-up     HPI: This patient is a 38 year old single white female who lives alone in [place]. 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  The patient was initially referred by Dr. Syliva OvermanMargaret Simpson for further assessment and treatment of depression. She has been seeing Florencia ReasonsPeggy Bynum for several months in our office for counseling.  The patient states that she had one episode of depression previously about 10 years ago. This was during a time in which she was unemployed but she did not receive any specific treatment. She states that she had been doing well until approximately 2 years ago. At this time her sister's child was born and she felt like her parents attention was focused more for herself to the new baby. Her brother is since had a child as  well. She is the only one in the family has not married it and does not have children and this makes her feel bad. 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  The patient's depression worsened over time and her primary Dr. put her on Effexor XR and the doses gradually been increased to 150 mg every morning. She states that each time its increase she feels better for about a month and then she feels worse again. Current symptoms include anhedonia, hypersomnia, feeling fatigue and low energy low motivation and occasional crying. She states that she has no interest in sex or dating. She spends most of her wake and sleeping. She talks on the phone to friends but has no interest in getting together. Most of her social life is with her family. She denies suicidal ideation or psychotic symptoms. She does not use drugs or alcohol she's not been the victim of any trauma or abuse either in childhood or as an adult. She states that she eats too much and has gained quite a bit of weight recently and does not get any exercise her health is good except for chronic migraines which have improved with a combination of Topamax and Maxalt  The patient returns for follow-up after 4 weeks.  Last time we increased her Trintellix to 30 mg and she seems to be doing much better.  She has been rather sick recently having gotten influenza  A and an ear infection.  Both of her parents had COVID-19 but all of her testing has been negative.  She is passed all this now having been on antibiotics.  She is bright and upbeat today and states that her mood and energy are good and she is sleeping well. Visit Diagnosis:    ICD-10-CM   1. Moderate single current episode of major depressive disorder (HCC)  F32.1 venlafaxine XR (EFFEXOR-XR) 150 MG 24 hr capsule    Past Psychiatric History: none  Past Medical History:  Past Medical History:  Diagnosis  Date  . Acne    facial  . Arthritis   . Migraines   . Nicotine addiction   . Obesity     Past Surgical History:  Procedure Laterality Date  . CHOLECYSTECTOMY      Family Psychiatric History: none  Family History:  Family History  Problem Relation Age of Onset  . Hypertension Mother   . Cancer Mother 11       colon  . Obesity Mother   . Hypertension Father   . Diabetes Father   . Hyperlipidemia Father   . Obesity Father     Social History:  Social History   Socioeconomic History  . Marital status: Single    Spouse name: Not on file  . Number of children: 0  . Years of education: Bachelors  . Highest education level: Not on file  Occupational History  . 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 Financial resource strain: Not on file  . Food insecurity    Worry: Not on file    Inability: Not on file  . Transportation needs    Medical: Not on file    Non-medical: Not on file  Tobacco Use  . Smoking status: Former Smoker    Quit date: 11/13/2008    Years since quitting: 10.3  . Smokeless tobacco: Never Used  . Tobacco comment: Quit 2008, 04-02-2016 per pt stopped 2010  Substance and Sexual Activity  . Alcohol use: No    Alcohol/week: 0.0 standard drinks    Comment: occasional use  a beer once every 3 months or so, 04-02-2016 per pt occas.   . Drug use: No    Comment: 04-02-2016 per pt no  . Sexual activity: Not Currently    Birth control/protection: Pill  Lifestyle  . Physical activity    Days per week: Not on file    Minutes per session: Not on file  . Stress: Not on file  Relationships  . Social Herbalist on phone: Not on file    Gets together: Not on file    Attends religious service: Not on file    Active member of club or organization: Not on file    Attends meetings of clubs or organizations: Not on file    Relationship status: Not on file  Other Topics Concern  . Not on file  Social History Narrative   Lives at home alone.    Right-handed.   Occasional use of caffeine.    Allergies: No Known Allergies  Metabolic Disorder Labs: Lab Results  Component Value Date   HGBA1C 5.3 11/30/2018   MPG 105 11/30/2018   MPG 103 11/06/2016   No results found for: PROLACTIN Lab Results  Component Value Date   CHOL 217 (H) 11/30/2018   TRIG 222 (H) 11/30/2018   HDL 57 11/30/2018   CHOLHDL 3.8 11/30/2018   VLDL 31 (H) 11/06/2016   [place]  124 (H) 11/30/2018   LDLCALC 125 (H) 11/04/2017   Lab Results  Component Value Date   TSH 1.77 11/30/2018   TSH 2.55 11/04/2017    Therapeutic Level Labs: No results found for: LITHIUM No results found for: VALPROATE No components found for:  CBMZ  Current Medications: Current Outpatient Medications  Medication Sig Dispense Refill  . Glucosamine HCl (GLUCOSAMINE PO) Take by mouth daily.    Marland Kitchen ibuprofen (ADVIL) 800 MG tablet Take one tablet by mouth, twice daily , if needed , for severe headache , along with immitrex 30 tablet 1  . loratadine (CLARITIN) 10 MG tablet TAKE ONE TABLET BY MOUTH ONCE DAILY 90 tablet 3  . meclizine (ANTIVERT) 25 MG tablet Take 1 tablet (25 mg total) by mouth 3 (three) times daily as needed for dizziness. (Patient not taking: Reported on 03/16/2019) 30 tablet 0  . meclizine (ANTIVERT) 25 MG tablet Take 1 tablet (25 mg total) by mouth 3 (three) times daily as needed for dizziness. 30 tablet 0  . Multiple Vitamin (MULTIVITAMIN) tablet Take 1 tablet by mouth daily.      . Norgestimate-Ethinyl Estradiol Triphasic (TRI-SPRINTEC) 0.18/0.215/0.25 MG-35 MCG tablet Take 1 tablet by mouth daily. 3 Package 3  . pantoprazole (PROTONIX) 40 MG tablet Take one tablet 2 times daily for 2 days, when you take ibuprofen for headache 60 tablet 1  . phentermine (ADIPEX-P) 37.5 MG tablet Take half tablet once daily every morning at breakfast 15 tablet 1  . rizatriptan (MAXALT-MLT) 10 MG disintegrating tablet Take 1 tablet (10 mg total) by mouth as needed. May repeat in 2  hours if needed 12 tablet 11  . topiramate (TOPAMAX) 100 MG tablet Take one tablet by mouth twice daily.  No further refills until seen. 180 tablet 4  . venlafaxine XR (EFFEXOR-XR) 150 MG 24 hr capsule Take 2 capsules (300 mg total) by mouth daily with breakfast. 90 capsule 2  . vortioxetine HBr (TRINTELLIX) 10 MG TABS tablet Take 1 tablet (10 mg total) by mouth daily. 30 tablet 2  . vortioxetine HBr (TRINTELLIX) 20 MG TABS tablet Take 1 tablet (20 mg total) by mouth daily. 30 tablet 2   No current facility-administered medications for this visit.      Musculoskeletal: Strength & Muscle Tone: within normal limits Gait & Station: normal Patient leans: N/A  Psychiatric Specialty Exam: Review of Systems  All other systems reviewed and are negative.   There were no vitals taken for this visit.There is no height or weight on file to calculate BMI.  General Appearance: NA  Eye Contact:  NA  Speech:  Clear and Coherent  Volume:  Normal  Mood:  Euthymic  Affect:  Appropriate and Congruent  Thought Process:  Goal Directed  Orientation:  Full (Time, Place, and Person)  Thought Content: WDL   Suicidal Thoughts:  No  Homicidal Thoughts:  No  Memory:  Immediate;   Good Recent;   Good Remote;   Fair  Judgement:  Good  Insight:  Good  Psychomotor Activity:  Normal  Concentration:  Concentration: Good and Attention Span: Good  Recall:  Good  Fund of Knowledge: Good  Language: Good  Akathisia:  No  Handed:  Right  AIMS (if indicated): not done  Assets:  Communication Skills Desire for Improvement Physical Health Resilience Social Support Talents/Skills Vocational/Educational  ADL's:  Intact  Cognition: WNL  Sleep:  Good   Screenings: PHQ2-9     Office Visit from 01/27/2019 in [facility]  Visit from 12/02/2018 in [facility] Visit from 07/06/2018 in [facility] Visit from 03/18/2018 in [facility]or from  [facility]  PHQ-2 Total Score  PHQ-9 Total Score  6  -  13  -  9       Assessment and Plan: This patient is a 38 year old female with a history of depression.  She is doing much better since we increased her Trintellix.  She will continue Trintellix 30 mg daily for depression and Effexor XR 300 mg daily also for depression.  She will return to see me in 3 months   Diannia Ruder, MD 04/05/2019, 3:52 PM

## 2019-05-06 ENCOUNTER — Ambulatory Visit: Payer: BC Managed Care – PPO | Admitting: Family Medicine

## 2019-06-27 ENCOUNTER — Ambulatory Visit: Payer: BC Managed Care – PPO | Attending: Internal Medicine

## 2019-06-27 DIAGNOSIS — Z23 Encounter for immunization: Secondary | ICD-10-CM | POA: Insufficient documentation

## 2019-06-27 NOTE — Progress Notes (Signed)
   Covid-19 Vaccination Clinic  Name:  Claire [name]    MRN: [id number] DOB: [birth year]  06/27/2019  Claire [name] was observed post Covid-19 immunization for 15 minutes without incidence. She was provided with Vaccine Information Sheet and instruction to access the V-Safe system.   Claire [name] was instructed to call 911 with any severe reactions post vaccine: Marland Kitchen Difficulty breathing  . Swelling of your face and throat  . A fast heartbeat  . A bad rash all over your body  . Dizziness and weakness    Immunizations Administered    Name Date Dose VIS Date Route   Moderna COVID-19 Vaccine 06/27/2019  1:41 PM 0.5 mL 03/30/2019 Intramuscular   Manufacturer: Moderna   Lot: [id number]   NDC: 46190-122-24

## 2019-07-06 ENCOUNTER — Ambulatory Visit (HOSPITAL_COMMUNITY): Payer: BC Managed Care – PPO | Admitting: Psychiatry

## 2019-07-08 ENCOUNTER — Ambulatory Visit (HOSPITAL_COMMUNITY): Payer: BC Managed Care – PPO | Admitting: Psychiatry

## 2019-07-16 ENCOUNTER — Telehealth: Payer: Self-pay | Admitting: Family Medicine

## 2019-07-16 ENCOUNTER — Encounter: Payer: Self-pay | Admitting: Family Medicine

## 2019-07-16 ENCOUNTER — Other Ambulatory Visit: Payer: Self-pay | Admitting: Family Medicine

## 2019-07-16 DIAGNOSIS — N921 Excessive and frequent menstruation with irregular cycle: Secondary | ICD-10-CM

## 2019-07-16 MED ORDER — RIZATRIPTAN BENZOATE 10 MG PO TBDP: 10.0000 mg | ORAL_TABLET | ORAL | 3 refills | Status: DC | PRN

## 2019-07-16 MED ORDER — TEMAZEPAM 15 MG PO CAPS: 15.0000 mg | ORAL_CAPSULE | Freq: Every evening | ORAL | 0 refills | Status: DC | PRN

## 2019-07-16 MED ORDER — TOPIRAMATE 100 MG PO TABS: 100.0000 mg | ORAL_TABLET | Freq: Two times a day (BID) | ORAL | 3 refills | Status: DC

## 2019-07-16 NOTE — Telephone Encounter (Signed)
Pls read pt message response by me and provide work excuse , also follow up with Gyne referral and enter for me, I have let her decide who she wants to see, I will sign, dx is prolonged menses, or dysfunctional uterine bleedicing  ?? Please ask, thanks!

## 2019-07-19 NOTE — Telephone Encounter (Signed)
Referred and letter sent via mychart

## 2019-07-19 NOTE — Addendum Note (Signed)
Addended by: Abner Greenspan on: 07/19/2019 02:10 PM   Modules accepted: Orders

## 2019-07-31 ENCOUNTER — Ambulatory Visit: Payer: BC Managed Care – PPO | Attending: Internal Medicine

## 2019-07-31 DIAGNOSIS — Z23 Encounter for immunization: Secondary | ICD-10-CM

## 2019-07-31 NOTE — Progress Notes (Signed)
   Covid-19 Vaccination Clinic  Name:  Claire [name]    MRN: [id number] DOB: 01-28-1981  07/31/2019  [name] was observed post Covid-19 immunization for 15 minutes without incident. She was provided with Vaccine Information Sheet and instruction to access the V-Safe system.   [name] was instructed to call 911 with any severe reactions post vaccine: Marland Kitchen Difficulty breathing  . Swelling of face and throat  . A fast heartbeat  . A bad rash all over body  . Dizziness and weakness   Immunizations Administered    Name Date Dose VIS Date Route   Moderna COVID-19 Vaccine 07/31/2019  1:39 PM 0.5 mL 03/30/2019 Intramuscular   Manufacturer: Moderna   Lot: [id number]   NDC: 72257-505-18

## 2019-08-02 ENCOUNTER — Encounter: Payer: Self-pay | Admitting: Women's Health

## 2019-08-02 ENCOUNTER — Ambulatory Visit (INDEPENDENT_AMBULATORY_CARE_PROVIDER_SITE_OTHER): Payer: BC Managed Care – PPO | Admitting: Women's Health

## 2019-08-02 ENCOUNTER — Other Ambulatory Visit: Payer: Self-pay

## 2019-08-02 ENCOUNTER — Other Ambulatory Visit (HOSPITAL_COMMUNITY)
Admission: RE | Admit: 2019-08-02 | Discharge: 2019-08-02 | Disposition: A | Discharge status: Home / Self Care | Payer: BC Managed Care – PPO | Source: Ambulatory Visit | Attending: Obstetrics and Gynecology | Admitting: Obstetrics and Gynecology

## 2019-08-02 VITALS — BP 134/86 | HR 92 | Ht 63.0 in | Wt 335.0 lb

## 2019-08-02 DIAGNOSIS — Z113 Encounter for screening for infections with a predominantly sexual mode of transmission: Secondary | ICD-10-CM

## 2019-08-02 DIAGNOSIS — N760 Acute vaginitis: Secondary | ICD-10-CM | POA: Diagnosis not present

## 2019-08-02 DIAGNOSIS — N926 Irregular menstruation, unspecified: Secondary | ICD-10-CM

## 2019-08-02 DIAGNOSIS — B9689 Other specified bacterial agents as the cause of diseases classified elsewhere: Secondary | ICD-10-CM | POA: Diagnosis not present

## 2019-08-02 DIAGNOSIS — Z202 Contact with and (suspected) exposure to infections with a predominantly sexual mode of transmission: Secondary | ICD-10-CM

## 2019-08-02 LAB — POCT WET PREP (WET MOUNT)
Clue Cells Wet Prep Whiff POC: POSITIVE
Trichomonas Wet Prep HPF POC: ABSENT

## 2019-08-02 MED ORDER — METRONIDAZOLE 500 MG PO TABS: 500.0000 mg | ORAL_TABLET | Freq: Two times a day (BID) | ORAL | 0 refills | Status: DC

## 2019-08-02 NOTE — Patient Instructions (Signed)
Bacterial Vaginosis  Bacterial vaginosis is a vaginal infection that occurs when the normal balance of bacteria in the vagina is disrupted. It results from an overgrowth of certain bacteria. This is the most common vaginal infection among women ages 15-44. Because bacterial vaginosis increases your risk for STIs (sexually transmitted infections), getting treated can help reduce your risk for chlamydia, gonorrhea, herpes, and HIV (human immunodeficiency virus). Treatment is also important for preventing complications in pregnant women, because this condition can cause an early (premature) delivery. What are the causes? This condition is caused by an increase in harmful bacteria that are normally present in small amounts in the vagina. However, the reason that the condition develops is not fully understood. What increases the risk? The following factors may make you more likely to develop this condition:  Having a new sexual partner or multiple sexual partners.  Having unprotected sex.  Douching.  Having an intrauterine device (IUD).  Smoking.  Drug and alcohol abuse.  Taking certain antibiotic medicines.  Being pregnant. You cannot get bacterial vaginosis from toilet seats, bedding, swimming pools, or contact with objects around you. What are the signs or symptoms? Symptoms of this condition include:  Grey or white vaginal discharge. The discharge can also be watery or foamy.  A fish-like odor with discharge, especially after sexual intercourse or during menstruation.  Itching in and around the vagina.  Burning or pain with urination. Some women with bacterial vaginosis have no signs or symptoms. How is this diagnosed? This condition is diagnosed based on:  Your medical history.  A physical exam of the vagina.  Testing a sample of vaginal fluid under a microscope to look for a large amount of bad bacteria or abnormal cells. Your health care provider may use a cotton swab or  a small wooden spatula to collect the sample. How is this treated? This condition is treated with antibiotics. These may be given as a pill, a vaginal cream, or a medicine that is put into the vagina (suppository). If the condition comes back after treatment, a second round of antibiotics may be needed. Follow these instructions at home: Medicines  Take over-the-counter and prescription medicines only as told by your health care provider.  Take or use your antibiotic as told by your health care provider. Do not stop taking or using the antibiotic even if you start to feel better. General instructions  If you have a female sexual partner, tell her that you have a vaginal infection. She should see her health care provider and be treated if she has symptoms. If you have a female sexual partner, he does not need treatment.  During treatment: ? Avoid sexual activity until you finish treatment. ? Do not douche. ? Avoid alcohol as directed by your health care provider. ? Avoid breastfeeding as directed by your health care provider.  Drink enough water and fluids to keep your urine clear or pale yellow.  Keep the area around your vagina and rectum clean. ? Wash the area daily with warm water. ? Wipe yourself from front to back after using the toilet.  Keep all follow-up visits as told by your health care provider. This is important. How is this prevented?  Do not douche.  Wash the outside of your vagina with warm water only.  Use protection when having sex. This includes latex condoms and dental dams.  Limit how many sexual partners you have. To help prevent bacterial vaginosis, it is best to have sex with just one partner (  monogamous).  Make sure you and your sexual partner are tested for STIs.  Wear cotton or cotton-lined underwear.  Avoid wearing tight pants and pantyhose, especially during summer.  Limit the amount of alcohol that you drink.  Do not use any products that contain  nicotine or tobacco, such as cigarettes and e-cigarettes. If you need help quitting, ask your health care provider.  Do not use illegal drugs. Where to find more information  Centers for Disease Control and Prevention: www.cdc.gov/std  American Sexual Health Association (ASHA): www.ashastd.org  U.S. Department of Health and Human Services, Office on Women's Health: www.womenshealth.gov/ or https://www.womenshealth.gov/a-z-topics/bacterial-vaginosis Contact a health care provider if:  Your symptoms do not improve, even after treatment.  You have more discharge or pain when urinating.  You have a fever.  You have pain in your abdomen.  You have pain during sex.  You have vaginal bleeding between periods. Summary  Bacterial vaginosis is a vaginal infection that occurs when the normal balance of bacteria in the vagina is disrupted.  Because bacterial vaginosis increases your risk for STIs (sexually transmitted infections), getting treated can help reduce your risk for chlamydia, gonorrhea, herpes, and HIV (human immunodeficiency virus). Treatment is also important for preventing complications in pregnant women, because the condition can cause an early (premature) delivery.  This condition is treated with antibiotic medicines. These may be given as a pill, a vaginal cream, or a medicine that is put into the vagina (suppository). This information is not intended to replace advice given to you by your health care provider. Make sure you discuss any questions you have with your health care provider. Document Revised: 03/28/2017 Document Reviewed: 12/30/2015 Elsevier Patient Education  2020 Elsevier Inc.  

## 2019-08-02 NOTE — Progress Notes (Signed)
GYN VISIT Patient name: Claire [name] MRN [id number]  Date of birth: [birth year] Chief Complaint:   Menstrual Problem  History of Present Illness:   Claire [name] is a 39 y.o. G54P0010 Caucasian female being seen today for report of prolonged period x 1. Periods usually regular, normal flow. Brother unexpectedly passed away 2022/07/23 in car wreck. Period started either right before or on that date, had normal period flow x 5-6d, then light bleeding that lasted until the end of March when another regular period started. She stopped bleeding on 4/1. Has been on COCs x years. Hasn't had sex in about . Denies abnormal discharge, odor/irritation.  Some itching.  Depression screen [facility] 2/9 08/02/2019 01/27/2019 12/02/2018 07/06/2018 03/18/2018  Decreased Interest 2 1 1 2  0  Down, Depressed, Hopeless 2 2 0 2 1  PHQ - 2 Score 4 3 1 4 1   Altered sleeping 2 1 - 2 -  Tired, decreased energy 2 1 - 2 -  Change in appetite 1 1 - 3 -  Feeling bad or failure about yourself  0 0 - 1 -  Trouble concentrating 1 0 - 1 -  Moving slowly or fidgety/restless 0 0 - 0 -  Suicidal thoughts 0 0 - 0 -  PHQ-9 Score 10 6 - 13 -  Difficult doing work/chores - Somewhat difficult - - -  Some encounter information is confidential and restricted. Go to Review Flowsheets activity to see all data.  Some recent data might be hidden    Patient's last menstrual period was 2019/07/23. The current method of family planning is OCP (estrogen/progesterone).  Last pap 12/02/18. Results were:  normal Review of Systems:   Pertinent items are noted in HPI Denies fever/chills, dizziness, headaches, visual disturbances, fatigue, shortness of breath, chest pain, abdominal pain, vomiting, abnormal vaginal discharge/itching/odor/irritation, problems with periods, bowel movements, urination, or intercourse unless otherwise stated above.  Pertinent History Reviewed:  Reviewed past medical,surgical, social, obstetrical and family history.  Reviewed  problem list, medications and allergies. Physical Assessment:   Vitals:   08/02/19 1507  BP: 134/86  Pulse: 92  Weight: (!) 335 lb (152 kg)  Height: 5\' 3"  (1.6 m)  Body mass index is 59.34 kg/m.       Physical Examination:   General appearance: alert, well appearing, and in no distress  Mental status: alert, oriented to person, place, and time  Skin: warm & dry   Cardiovascular: normal heart rate noted  Respiratory: normal respiratory effort, no distress  Abdomen: soft, non-tender   Pelvic: VULVA: normal appearing vulva with no masses, tenderness or lesions, VAGINA: normal appearing vagina with normal color and thin malodorous discharge, no lesions, CERVIX: normal appearing cervix without discharge or lesions, UTERUS: unable to adequately assess d/t habitus, no tenderness, ADNEXA: unable to adequately assess d/t habitus, no tenderness  Extremities: no edema   Chaperone: Amanda Rash    Results for orders placed or performed in visit on 08/02/19 (from the past 24 hour(s))  POCT Wet Prep 10/02/19 [place])   Collection Time: 08/02/19  3:51 PM  Result Value Ref Range   Source Wet Prep POC vaginal    WBC, Wet Prep HPF POC few    Bacteria Wet Prep HPF POC Few Few   BACTERIA WET PREP MORPHOLOGY POC     Clue Cells Wet Prep HPF POC Many (A) None   Clue Cells Wet Prep Whiff POC Positive Whiff    Yeast Wet Prep HPF POC None None  KOH Wet Prep POC     Trichomonas Wet Prep HPF POC Absent Absent    Assessment & Plan:  1) Prolonged period x 1> possibly r/t BV or stress, tx BV, if happens again to let me know and will get pelvic u/s. Send CV swab for gc/ct/trich  2) BV> Rx metronidazole 500mg  BID x 7d for BV, no sex or etoh while taking  3) STD screen> gc/ct/trich   Meds: No orders of the defined types were placed in this encounter.   Orders Placed This Encounter  Procedures  . POCT Wet Prep [facility])    Return for prn.  [place], [facility] 08/02/2019 3:51 PM

## 2019-08-04 LAB — CERVICOVAGINAL ANCILLARY ONLY
Chlamydia: NEGATIVE
Comment: NEGATIVE
Comment: NEGATIVE
Comment: NORMAL
Neisseria Gonorrhea: NEGATIVE
Trichomonas: NEGATIVE

## 2019-08-06 ENCOUNTER — Other Ambulatory Visit: Payer: Self-pay | Admitting: Adult Health

## 2019-08-06 MED ORDER — ONDANSETRON 4 MG PO TBDP: 4.0000 mg | ORAL_TABLET | Freq: Three times a day (TID) | ORAL | 0 refills | Status: DC | PRN

## 2019-08-06 NOTE — Progress Notes (Signed)
rx zofran 

## 2019-09-16 ENCOUNTER — Encounter: Payer: Self-pay | Admitting: Family Medicine

## 2019-09-16 ENCOUNTER — Other Ambulatory Visit (HOSPITAL_COMMUNITY): Payer: Self-pay | Admitting: Psychiatry

## 2019-09-17 ENCOUNTER — Other Ambulatory Visit: Payer: Self-pay | Admitting: *Deleted

## 2019-09-17 MED ORDER — NORGESTIM-ETH ESTRAD TRIPHASIC 0.18/0.215/0.25 MG-35 MCG PO TABS: 1.0000 | ORAL_TABLET | Freq: Every day | ORAL | 3 refills | Status: DC

## 2019-09-17 NOTE — Telephone Encounter (Signed)
Call for appt

## 2019-09-20 ENCOUNTER — Encounter (HOSPITAL_COMMUNITY): Payer: Self-pay | Admitting: *Deleted

## 2019-09-20 NOTE — Telephone Encounter (Signed)
LMOM

## 2019-09-20 NOTE — Telephone Encounter (Signed)
Call for appt Ranelle Auker 11-05-1980

## 2019-09-28 ENCOUNTER — Encounter (HOSPITAL_COMMUNITY): Payer: Self-pay | Admitting: Psychiatry

## 2019-09-28 ENCOUNTER — Telehealth (INDEPENDENT_AMBULATORY_CARE_PROVIDER_SITE_OTHER): Payer: BC Managed Care – PPO | Admitting: Psychiatry

## 2019-09-28 ENCOUNTER — Other Ambulatory Visit: Payer: Self-pay

## 2019-09-28 DIAGNOSIS — F321 Major depressive disorder, single episode, moderate: Secondary | ICD-10-CM

## 2019-09-28 MED ORDER — VORTIOXETINE HBR 20 MG PO TABS: 20.0000 mg | ORAL_TABLET | Freq: Every day | ORAL | 0 refills | Status: DC

## 2019-09-28 MED ORDER — VENLAFAXINE HCL ER 150 MG PO CP24: 300.0000 mg | ORAL_CAPSULE | Freq: Every day | ORAL | 2 refills | Status: DC

## 2019-09-28 MED ORDER — VORTIOXETINE HBR 10 MG PO TABS: 10.0000 mg | ORAL_TABLET | Freq: Every day | ORAL | 2 refills | Status: DC
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
## 2019-10-25 ENCOUNTER — Encounter: Payer: Self-pay | Admitting: Family Medicine

## 2019-10-25 NOTE — Telephone Encounter (Signed)
Made the pt appt tomorrow 6-29-3pm

## 2019-10-26 ENCOUNTER — Ambulatory Visit: Payer: BC Managed Care – PPO | Admitting: Family Medicine

## 2019-10-26 ENCOUNTER — Encounter: Payer: Self-pay | Admitting: Family Medicine

## 2019-10-26 ENCOUNTER — Other Ambulatory Visit: Payer: Self-pay

## 2019-10-26 VITALS — BP 112/80 | HR 96 | Ht 63.0 in | Wt 335.0 lb

## 2019-10-26 DIAGNOSIS — Z1159 Encounter for screening for other viral diseases: Secondary | ICD-10-CM

## 2019-10-26 DIAGNOSIS — F321 Major depressive disorder, single episode, moderate: Secondary | ICD-10-CM | POA: Diagnosis not present

## 2019-10-26 DIAGNOSIS — G43709 Chronic migraine without aura, not intractable, without status migrainosus: Secondary | ICD-10-CM

## 2019-10-26 DIAGNOSIS — IMO0002 Reserved for concepts with insufficient information to code with codable children: Secondary | ICD-10-CM

## 2019-10-26 MED ORDER — IBUPROFEN 800 MG PO TABS: ORAL_TABLET | ORAL | 0 refills | Status: DC

## 2019-10-26 NOTE — Patient Instructions (Addendum)
F/U in 3 months, re evaluate uncontrolled migraines  I will be in touch within 24 hrs re new preventive medication for migraines, please also start a migraine diary  Ibuprofen is sent for use when headache is severe  Thanks for choosing [facility], we consider it a privelige to serve you.

## 2019-10-26 NOTE — Progress Notes (Signed)
   Claire [name]     MRN: [id number]      DOB: 11/08/1980   HPI Claire [name] is here 1 month h/o increased frequency and severity  Of migraines, weekly headaches , duration as much as 4 days , most recent headache started 2 days ago and ended yesterday, no aura right sided throbbing Still grieving unexpected loss of her sibling in a MVA, not ready to talk with her therapist about it, family supporting each other ROS Denies recent fever or chills. Denies sinus pressure, nasal congestion, ear pain or sore throat. Denies chest congestion, productive cough or wheezing. Denies chest pains, palpitations and leg swelling Denies abdominal pain, nausea, vomiting,diarrhea or constipation.   Denies dysuria, frequency, hesitancy or incontinence. Denies joint pain, swelling and limitation in mobility. Denies seizures, numbness, or tingling.  Denies skin break down or rash.   PE  BP 112/80   Pulse 96   Ht 5\' 3"  (1.6 m)   Wt (!) 335 lb (152 kg)   SpO2 100%   BMI 59.34 kg/m   Patient alert and oriented and in no cardiopulmonary distress.  HEENT: No facial asymmetry, EOMI,     Neck supple .  Chest: Clear to auscultation bilaterally.  CVS: S1, S2 no murmurs, no S3.Regular rate.  .   Ext: No edema  MS: Adequate ROM spine, shoulders, hips and knees.  Skin: Intact, no ulcerations or rash noted.  Psych: Good eye contact, normal affect. Memory intact not anxious or depressed appearing.  CNS: CN 2-12 intact, power,  normal throughout.no focal deficits noted.   Assessment & Plan  Chronic migraine Uncontrolled x 1 month, increased frequency and severity. No new symptoms, experienced as right sided pain, still responds to maxalt Change from topamax , taper this off, to propranolol and Neurology to manage, as pt has complex migraine history and will benefit from more advanced management than I am able to provide  Depression Managed by Psych in [place] gradually, unfortunately , she lost her  brother tragically 3 months ago, and is having to deal with this as a new stress No med change, she is encouraged gently to start therapy also  Morbid obesity  Patient re-educated about  the importance of commitment to a  minimum of 150 minutes of exercise per week as able.  The importance of healthy food choices with portion control discussed, as well as eating regularly and within a 12 hour window most days. The need to choose "clean , green" food 50 to 75% of the time is discussed, as well as to make water the primary drink and set a goal of 64 ounces water daily.    Weight /BMI 10/26/2019 08/02/2019 03/16/2019  WEIGHT 335 lb 335 lb 337 lb  HEIGHT 5\' 3"  5\' 3"  5\' 3"   BMI 59.34 kg/m2 59.34 kg/m2 59.7 kg/m2  Some encounter information is confidential and restricted. Go to Review Flowsheets activity to see all data.

## 2019-10-28 ENCOUNTER — Encounter: Payer: Self-pay | Admitting: Family Medicine

## 2019-10-29 ENCOUNTER — Encounter: Payer: Self-pay | Admitting: Family Medicine

## 2019-10-29 MED ORDER — TOPIRAMATE 50 MG PO TABS: 50.0000 mg | ORAL_TABLET | Freq: Two times a day (BID) | ORAL | 0 refills | Status: DC

## 2019-10-29 MED ORDER — PROPRANOLOL HCL 20 MG PO TABS: 20.0000 mg | ORAL_TABLET | Freq: Two times a day (BID) | ORAL | 1 refills | Status: DC

## 2019-10-29 NOTE — Assessment & Plan Note (Signed)
  Patient re-educated about  the importance of commitment to a  minimum of 150 minutes of exercise per week as able.  The importance of healthy food choices with portion control discussed, as well as eating regularly and within a 12 hour window most days. The need to choose "clean , green" food 50 to 75% of the time is discussed, as well as to make water the primary drink and set a goal of 64 ounces water daily.    Weight /BMI 10/26/2019 08/02/2019 03/16/2019  WEIGHT 335 lb 335 lb 337 lb  HEIGHT 5\' 3"  5\' 3"  5\' 3"   BMI 59.34 kg/m2 59.34 kg/m2 59.7 kg/m2  Some encounter information is confidential and restricted. Go to Review Flowsheets activity to see all data.

## 2019-10-29 NOTE — Assessment & Plan Note (Signed)
Uncontrolled x 1 month, increased frequency and severity. No new symptoms, experienced as right sided pain, still responds to maxalt Change from topamax , taper this off, to propranolol and Neurology to manage, as pt has complex migraine history and will benefit from more advanced management than I am able to provide

## 2019-10-29 NOTE — Assessment & Plan Note (Signed)
Managed by Psych in [place] gradually, unfortunately , she lost her brother tragically 3 months ago, and is having to deal with this as a new stress No med change, she is encouraged gently to start therapy also

## 2019-11-17 ENCOUNTER — Encounter: Payer: Self-pay | Admitting: Family Medicine

## 2019-11-29 ENCOUNTER — Encounter (HOSPITAL_COMMUNITY): Payer: Self-pay | Admitting: Psychiatry

## 2019-11-29 ENCOUNTER — Other Ambulatory Visit: Payer: Self-pay

## 2019-11-29 ENCOUNTER — Telehealth (INDEPENDENT_AMBULATORY_CARE_PROVIDER_SITE_OTHER): Payer: BC Managed Care – PPO | Admitting: Psychiatry

## 2019-11-29 DIAGNOSIS — F321 Major depressive disorder, single episode, moderate: Secondary | ICD-10-CM

## 2019-11-29 MED ORDER — VORTIOXETINE HBR 20 MG PO TABS: 20.0000 mg | ORAL_TABLET | Freq: Every day | ORAL | 2 refills | Status: DC

## 2019-11-29 MED ORDER — VENLAFAXINE HCL ER 150 MG PO CP24: 300.0000 mg | ORAL_CAPSULE | Freq: Every day | ORAL | 2 refills | Status: DC

## 2019-11-29 MED ORDER — VORTIOXETINE HBR 10 MG PO TABS: 10.0000 mg | ORAL_TABLET | Freq: Every day | ORAL | 2 refills | Status: DC

## 2019-11-29 NOTE — Progress Notes (Signed)
Virtual Visit via Telephone Note  I connected with Claire [name] on 11/29/19 at  9:40 AM EDT by telephone and verified that I am speaking with the correct person using two identifiers.   I discussed the limitations, risks, security and privacy concerns of performing an evaluation and management service by telephone and the availability of in person appointments. I also discussed with the patient that there may be a patient responsible charge related to this service. The patient expressed understanding and agreed to proceed.    I discussed the assessment and treatment plan with the patient. The patient was provided an opportunity to ask questions and all were answered. The patient agreed with the plan and demonstrated an understanding of the instructions.   The patient was advised to call back or seek an in-person evaluation if the symptoms worsen or if the condition fails to improve as anticipated.  I provided 15 minutes of non-face-to-face time during this encounter. Location: Provider office, patient home  Diannia Rudereborah Lettie Czarnecki, MD  [facility] OP Progress Note  11/29/2019 9:53 AM Claire GroveSheila M Wamser  MRN:  [id number]  Chief Complaint:  Chief Complaint    Depression; Anxiety; Follow-up     HPI: This patient is a 39 year old single white female who lives alone in [place]. 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  The patient was initially referred by Dr. Syliva OvermanMargaret Simpson for further assessment and treatment of depression. She has been seeing Florencia ReasonsPeggy Bynum for several months in our office for counseling.  The patient states that she had one episode of depression previously about 10 years ago. This was during a time in which she was unemployed but she did not receive any specific treatment. She states that she had been doing well until approximately 2 years ago. At this time her sister's child was born and she felt like her parents attention was focused more for herself to the new  baby. Her brother is since had a child as well. She is the only one in the family has not married it and does not have children and this makes her feel bad. 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  The patient's depression worsened over time and her primary Dr. put her on Effexor XR and the doses gradually been increased to 150 mg every morning. She states that each time its increase she feels better for about a month and then she feels worse again. Current symptoms include anhedonia, hypersomnia, feeling fatigue and low energy low motivation and occasional crying. She states that she has no interest in sex or dating. She spends most of her wake and sleeping. She talks on the phone to friends but has no interest in getting together. Most of her social life is with her family. She denies suicidal ideation or psychotic symptoms. She does not use drugs or alcohol she's not been the victim of any trauma or abuse either in childhood or as an adult. She states that she eats too much and has gained quite a bit of weight recently and does not get any exercise her health is good except for chronic migraines which have improved with a combination of Topamax and Maxalt  The patient returns for follow-up after 2 months.  Last time she told me that her brother died suddenly in a car wreck back in March.  She has been spending a lot  of time with his 43-year-old daughter and the rest of her family.  She states that she has had more migraine headaches recently and is going back to her neurologist.  Other than that her health has been good.  She states that she is not particularly depressed regarding her brother and that her mood has been good as well.  She is sleeping well and no longer needs to take any sleeping pills.  She is going back to school next week.  She denies thoughts of self-harm or suicidal ideation and seems fairly  upbeat. Visit Diagnosis:    ICD-10-CM   1. Moderate single current episode of major depressive disorder (HCC)  F32.1 venlafaxine XR (EFFEXOR-XR) 150 MG 24 hr capsule    Past Psychiatric History: none  Past Medical History:  Past Medical History:  Diagnosis Date  . Acne    facial  . Arthritis   . Migraines   . Nicotine addiction   . Obesity     Past Surgical History:  Procedure Laterality Date  . CHOLECYSTECTOMY      Family Psychiatric History: see below  Family History:  Family History  Problem Relation Age of Onset  . Hypertension Mother   . Cancer Mother 23       colon  . Obesity Mother   . Hypertension Father   . Diabetes Father   . Hyperlipidemia Father   . Obesity Father     Social History:  Social History   Socioeconomic History  . Marital status: Single    Spouse name: Not on file  . Number of children: 0  . Years of education: Bachelors  . Highest education level: Not on file  Occupational History  . 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 Smoking status: Former Smoker    Quit date: 11/13/2008    Years since quitting: 11.0  . Smokeless tobacco: Never Used  . Tobacco comment: Quit 2008, 04-02-2016 per pt stopped 2010  Vaping Use  . Vaping Use: Never used  Substance and Sexual Activity  . Alcohol use: No    Alcohol/week: 0.0 standard drinks    Comment: occasional use  a beer once every 3 months or so, 04-02-2016 per pt occas.   . Drug use: No    Comment: 04-02-2016 per pt no  . Sexual activity: Not Currently    Birth control/protection: Pill  Other Topics Concern  . Not on file  Social History Narrative   Lives at home alone.   Right-handed.   Occasional use of caffeine.   Social Determinants of Health   Financial Resource Strain: Low Risk   . Difficulty of Paying Living Expenses: Not very hard  Food Insecurity: Food Insecurity Present  . Worried About Programme researcher, broadcasting/film/video in the Last Year: Sometimes true  . Ran Out of Food in the  Last Year: Never true  Transportation Needs: No Transportation Needs  . Lack of Transportation (Medical): No  . Lack of Transportation (Non-Medical): No  Physical Activity: Inactive  . Days of Exercise per Week: 0 days  . Minutes of Exercise per Session: 0 min  Stress: Stress Concern Present  . Feeling of Stress : Rather much  Social Connections: Socially Isolated  . Frequency of Communication with Friends and Family: More than three times a week  . Frequency of Social Gatherings with Friends and Family: Twice a week  . Attends Religious Services: Never  . Active Member of Clubs or Organizations: No  . Attends Club  or Organization Meetings: Never  . Marital Status: Never married    Allergies: No Known Allergies  Metabolic Disorder Labs: Lab Results  Component Value Date   HGBA1C 5.3 11/30/2018   MPG 105 11/30/2018   MPG 103 11/06/2016   No results found for: PROLACTIN Lab Results  Component Value Date   CHOL 217 (H) 11/30/2018   TRIG 222 (H) 11/30/2018   HDL 57 11/30/2018   CHOLHDL 3.8 11/30/2018   VLDL 31 (H) 11/06/2016   LDLCALC 124 (H) 11/30/2018   LDLCALC 125 (H) 11/04/2017   Lab Results  Component Value Date   TSH 1.77 11/30/2018   TSH 2.55 11/04/2017    Therapeutic Level Labs: No results found for: LITHIUM No results found for: VALPROATE No components found for:  CBMZ  Current Medications: Current Outpatient Medications  Medication Sig Dispense Refill  . Glucosamine HCl (GLUCOSAMINE PO) Take by mouth daily.    Marland Kitchen ibuprofen (ADVIL) 800 MG tablet Take one tablet by mouth  Twice  daily as needed, for headache. Maximum use twice weekly. 30 tablet 0  . loratadine (CLARITIN) 10 MG tablet TAKE ONE TABLET BY MOUTH ONCE DAILY 90 tablet 3  . Multiple Vitamin (MULTIVITAMIN) tablet Take 1 tablet by mouth daily.      . Norgestimate-Ethinyl Estradiol Triphasic (TRI-SPRINTEC) 0.18/0.215/0.25 MG-35 MCG tablet Take 1 tablet by mouth daily. 3 Package 3  . ondansetron  (ZOFRAN ODT) 4 MG disintegrating tablet Take 1 tablet (4 mg total) by mouth every 8 (eight) hours as needed for nausea or vomiting. 20 tablet 0  . propranolol (INDERAL) 20 MG tablet Take 1 tablet (20 mg total) by mouth 2 (two) times daily. 60 tablet 1  . rizatriptan (MAXALT-MLT) 10 MG disintegrating tablet Take 1 tablet (10 mg total) by mouth as needed for migraine. May repeat in 2 hours if needed 10 tablet 3  . topiramate (TOPAMAX) 50 MG tablet Take 1 tablet (50 mg total) by mouth 2 (two) times daily. 60 tablet 0  . venlafaxine XR (EFFEXOR-XR) 150 MG 24 hr capsule Take 2 capsules (300 mg total) by mouth daily with breakfast. 90 capsule 2  . vortioxetine HBr (TRINTELLIX) 10 MG TABS tablet Take 1 tablet (10 mg total) by mouth daily. 30 tablet 2  . vortioxetine HBr (TRINTELLIX) 20 MG TABS tablet Take 1 tablet (20 mg total) by mouth daily. 30 tablet 2   No current facility-administered medications for this visit.     Musculoskeletal: Strength & Muscle Tone: within normal limits Gait & Station: normal Patient leans: N/A  Psychiatric Specialty Exam: Review of Systems  Neurological: Positive for headaches.  All other systems reviewed and are negative.   There were no vitals taken for this visit.There is no height or weight on file to calculate BMI.  General Appearance:NA  Eye Contact:  NA  Speech:  Clear and Coherent  Volume:  Normal  Mood:  Euthymic  Affect:  NA  Thought Process:  Goal Directed  Orientation:  Full (Time, Place, and Person)  Thought Content: WDL   Suicidal Thoughts:  No  Homicidal Thoughts:  No  Memory:  Immediate;   Good Recent;   Good Remote;   Good  Judgement:  Good  Insight:  Fair  Psychomotor Activity:  Normal  Concentration:  Concentration: Good and Attention Span: Good  Recall:  Good  Fund of Knowledge: Good  Language: Good  Akathisia:  No  Handed:  Right  AIMS (if indicated): not done  Assets:  Communication Skills Desire  for Improvement Physical  Health Resilience Social Support Talents/Skills  ADL's:  Intact  Cognition: WNL  Sleep:  Good   Screenings: GAD-7     Office Visit from 08/02/2019 in [facility]  Total GAD-7 Score 9    PHQ2-9     Office Visit from 08/02/2019 in [facility] Office Visit from 01/27/2019 in [facility] Office Visit from 12/02/2018 in [facility] Office Visit from 07/06/2018 in [facility] Office Visit from 03/18/2018 in [facility]  PHQ-2 Total Score 4 3 1 4 1   PHQ-9 Total Score 10 6 -- 13 --       Assessment and Plan: This patient is a 39 year old female with a history of depression.  So far she seems to be handling her father's death fairly well although she does not give a lot of information.  For now she will continue Trintellix 30 mg daily as well as Effexor XR 300 mg daily both for depression.  She will return to see me in 3 months or call sooner as needed   20, MD 11/29/2019, 9:53 AM

## 2019-12-28 ENCOUNTER — Telehealth: Payer: Self-pay | Admitting: Neurology

## 2019-12-28 NOTE — Telephone Encounter (Signed)
Pt left a voicemail at 9:41 this morning, pt was called back but the line rang busy on each attempt.  This is Financial planner

## 2019-12-28 NOTE — Telephone Encounter (Signed)
Pt was called again, phone rep recevied voicemail.  Message was left asking pt to call office.

## 2019-12-29 ENCOUNTER — Institutional Professional Consult (permissible substitution): Payer: BC Managed Care – PPO | Admitting: Neurology

## 2020-01-08 ENCOUNTER — Other Ambulatory Visit: Payer: Self-pay | Admitting: Family Medicine

## 2020-01-16 ENCOUNTER — Other Ambulatory Visit: Payer: Self-pay | Admitting: Family Medicine

## 2020-01-25 ENCOUNTER — Encounter: Payer: Self-pay | Admitting: Family Medicine

## 2020-01-26 ENCOUNTER — Ambulatory Visit: Payer: BC Managed Care – PPO | Admitting: Family Medicine

## 2020-02-29 ENCOUNTER — Other Ambulatory Visit: Payer: Self-pay

## 2020-02-29 ENCOUNTER — Telehealth (INDEPENDENT_AMBULATORY_CARE_PROVIDER_SITE_OTHER): Payer: BC Managed Care – PPO | Admitting: Psychiatry

## 2020-02-29 ENCOUNTER — Encounter (HOSPITAL_COMMUNITY): Payer: Self-pay | Admitting: Psychiatry

## 2020-02-29 DIAGNOSIS — F321 Major depressive disorder, single episode, moderate: Secondary | ICD-10-CM

## 2020-02-29 MED ORDER — VORTIOXETINE HBR 20 MG PO TABS: 20.0000 mg | ORAL_TABLET | Freq: Every day | ORAL | 2 refills | Status: DC

## 2020-02-29 MED ORDER — VENLAFAXINE HCL ER 150 MG PO CP24: 300.0000 mg | ORAL_CAPSULE | Freq: Every day | ORAL | 2 refills | Status: DC

## 2020-02-29 MED ORDER — VORTIOXETINE HBR 10 MG PO TABS: 10.0000 mg | ORAL_TABLET | Freq: Every day | ORAL | 2 refills | Status: DC

## 2020-02-29 NOTE — Progress Notes (Signed)
Virtual Visit via Telephone Note  I connected with Claire [name] on 02/29/20 at  4:00 PM EDT by telephone and verified that I am speaking with the correct person using two identifiers.  Location: Patient: work Provider: office   I discussed the limitations, risks, security and privacy concerns of performing an evaluation and management service by telephone and the availability of in person appointments. I also discussed with the patient that there may be a patient responsible charge related to this service. The patient expressed understanding and agreed to proceed.    I discussed the assessment and treatment plan with the patient. The patient was provided an opportunity to ask questions and all were answered. The patient agreed with the plan and demonstrated an understanding of the instructions.   The patient was advised to call back or seek an in-person evaluation if the symptoms worsen or if the condition fails to improve as anticipated.  I provided 15 minutes of non-face-to-face time during this encounter.   Claire Ruder, MD  [facility] MD/PA/NP OP Progress Note  02/29/2020 4:07 PM Claire [name]  MRN:  [id number]  Chief Complaint:  Chief Complaint    Depression; Follow-up     HPI: This patient is a 39 year old single white female who lives alone in [place]. 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  The patient was initially referred by Dr. Syliva Overman for further assessment and treatment of depression. She has been seeing Florencia Reasons for several months in our office for counseling.  The patient states that she had one episode of depression previously about 10 years ago. This was during a time in which she was unemployed but she did not receive any specific treatment. She states that she had been doing well until approximately 2 years ago. At this time her sister's child was born and she felt like her parents attention was focused more for herself to the new  baby. Her brother is since had a child as well. She is the only one in the family has not married it and does not have children and this makes her feel bad. 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  The patient's depression worsened over time and her primary Dr. put her on Effexor XR and the doses gradually been increased to 150 mg every morning. She states that each time its increase she feels better for about a month and then she feels worse again. Current symptoms include anhedonia, hypersomnia, feeling fatigue and low energy low motivation and occasional crying. She states that she has no interest in sex or dating. She spends most of her wake and sleeping. She talks on the phone to friends but has no interest in getting together. Most of her social life is with her family. She denies suicidal ideation or psychotic symptoms. She does not use drugs or alcohol she's not been the victim of any trauma or abuse either in childhood or as an adult. She states that she eats too much and has gained quite a bit of weight recently and does not get any exercise her health is good except for chronic migraines which have improved with a combination of Topamax and Maxalt  The patient returns for follow-up after 3 months.  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  She needed to make extra  money as the school does not pay her enough to make ends meet.  She is enjoying her new job.  She states that things at the school are somewhat chaotic but she is handling it.  She did lose her brother last year in a car wreck and does not seem overly depressed right now but is not anticipating to have a great holiday season.  Nevertheless she thinks her antidepressants are working well.  Her energy is good she is sleeping well and she denies significant anxiety or suicidal ideation Visit Diagnosis:     ICD-10-CM   1. Moderate single current episode of major depressive disorder (HCC)  F32.1 venlafaxine XR (EFFEXOR-XR) 150 MG 24 hr capsule    Past Psychiatric History: none  Past Medical History:  Past Medical History:  Diagnosis Date  . Acne    facial  . Arthritis   . Migraines   . Nicotine addiction   . Obesity     Past Surgical History:  Procedure Laterality Date  . CHOLECYSTECTOMY      Family Psychiatric History: see below  Family History:  Family History  Problem Relation Age of Onset  . Hypertension Mother   . Cancer Mother 48       colon  . Obesity Mother   . Hypertension Father   . Diabetes Father   . Hyperlipidemia Father   . Obesity Father     Social History:  Social History   Socioeconomic History  . Marital status: Single    Spouse name: Not on file  . Number of children: 0  . Years of education: Bachelors  . Highest education level: Not on file  Occupational History  . 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 Smoking status: Former Smoker    Quit date: 11/13/2008    Years since quitting: 11.3  . Smokeless tobacco: Never Used  . Tobacco comment: Quit 2008, 04-02-2016 per pt stopped 2010  Vaping Use  . Vaping Use: Never used  Substance and Sexual Activity  . Alcohol use: No    Alcohol/week: 0.0 standard drinks    Comment: occasional use  a beer once every 3 months or so, 04-02-2016 per pt occas.   . Drug use: No    Comment: 04-02-2016 per pt no  . Sexual activity: Not Currently    Birth control/protection: Pill  Other Topics Concern  . Not on file  Social History Narrative   Lives at home alone.   Right-handed.   Occasional use of caffeine.   Social Determinants of Health   Financial Resource Strain: Low Risk   . Difficulty of Paying Living Expenses: Not very hard  Food Insecurity: Food Insecurity Present  . Worried About Programme researcher, broadcasting/film/video in the Last Year: Sometimes true  . Ran Out of Food in the Last Year: Never true   Transportation Needs: No Transportation Needs  . Lack of Transportation (Medical): No  . Lack of Transportation (Non-Medical): No  Physical Activity: Inactive  . Days of Exercise per Week: 0 days  . Minutes of Exercise per Session: 0 min  Stress: Stress Concern Present  . Feeling of Stress : Rather much  Social Connections: Socially Isolated  . Frequency of Communication with Friends and Family: More than three times a week  . Frequency of Social Gatherings with Friends and Family: Twice a week  . Attends Religious Services: Never  . Active Member of Clubs or Organizations: No  . Attends Banker Meetings: Never  .  Marital Status: Never married    Allergies: No Known Allergies  Metabolic Disorder Labs: Lab Results  Component Value Date   HGBA1C 5.3 11/30/2018   MPG 105 11/30/2018   MPG 103 11/06/2016   No results found for: PROLACTIN Lab Results  Component Value Date   CHOL 217 (H) 11/30/2018   TRIG 222 (H) 11/30/2018   HDL 57 11/30/2018   CHOLHDL 3.8 11/30/2018   VLDL 31 (H) 11/06/2016   LDLCALC 124 (H) 11/30/2018   LDLCALC 125 (H) 11/04/2017   Lab Results  Component Value Date   TSH 1.77 11/30/2018   TSH 2.55 11/04/2017    Therapeutic Level Labs: No results found for: LITHIUM No results found for: VALPROATE No components found for:  CBMZ  Current Medications: Current Outpatient Medications  Medication Sig Dispense Refill  . Glucosamine HCl (GLUCOSAMINE PO) Take by mouth daily.    Marland Kitchen. ibuprofen (ADVIL) 800 MG tablet TAKE 1 TABLET BY MOUTH TWICE DAILY AS NEEDED FOR HEADACHE -  MAX  USE  OF  TWICE  A  WEEK 30 tablet 0  . loratadine (CLARITIN) 10 MG tablet TAKE ONE TABLET BY MOUTH ONCE DAILY 90 tablet 3  . Multiple Vitamin (MULTIVITAMIN) tablet Take 1 tablet by mouth daily.      . Norgestimate-Ethinyl Estradiol Triphasic (TRI-SPRINTEC) 0.18/0.215/0.25 MG-35 MCG tablet Take 1 tablet by mouth daily. 3 Package 3  . ondansetron (ZOFRAN ODT) 4 MG  disintegrating tablet Take 1 tablet (4 mg total) by mouth every 8 (eight) hours as needed for nausea or vomiting. 20 tablet 0  . propranolol (INDERAL) 20 MG tablet Take 1 tablet by mouth twice daily 60 tablet 5  . rizatriptan (MAXALT-MLT) 10 MG disintegrating tablet Take 1 tablet (10 mg total) by mouth as needed for migraine. May repeat in 2 hours if needed 10 tablet 3  . venlafaxine XR (EFFEXOR-XR) 150 MG 24 hr capsule Take 2 capsules (300 mg total) by mouth daily with breakfast. 90 capsule 2  . vortioxetine HBr (TRINTELLIX) 10 MG TABS tablet Take 1 tablet (10 mg total) by mouth daily. 30 tablet 2  . vortioxetine HBr (TRINTELLIX) 20 MG TABS tablet Take 1 tablet (20 mg total) by mouth daily. 30 tablet 2   No current facility-administered medications for this visit.     Musculoskeletal: Strength & Muscle Tone: within normal limits Gait & Station: normal Patient leans: N/A  Psychiatric Specialty Exam: Review of Systems  Neurological: Positive for headaches.  All other systems reviewed and are negative.   There were no vitals taken for this visit.There is no height or weight on file to calculate BMI.  General Appearance: NA  Eye Contact:  NA  Speech:  Clear and Coherent  Volume:  Normal  Mood:  Euthymic  Affect:  NA  Thought Process:  Goal Directed  Orientation:  Full (Time, Place, and Person)  Thought Content: WDL   Suicidal Thoughts:  No  Homicidal Thoughts:  No  Memory:  Immediate;   Good Recent;   Good Remote;   Good  Judgement:  Good  Insight:  Good  Psychomotor Activity:  Normal  Concentration:  Concentration: Good and Attention Span: Good  Recall:  Good  Fund of Knowledge: Good  Language: Good  Akathisia:  No  Handed:  Right  AIMS (if indicated): not done  Assets:  Communication Skills Desire for Improvement Physical Health Resilience Social Support Talents/Skills  ADL's:  Intact  Cognition: WNL  Sleep:  Good   Screenings: GAD-7  Office Visit from  08/02/2019 in [facility]  Total GAD-7 Score 9    PHQ2-9     Office Visit from 08/02/2019 in [facility] Office Visit from 01/27/2019 in [facility] Office Visit from 12/02/2018 in [facility] Office Visit from 07/06/2018 in [facility] Office Visit from 03/18/2018 in [facility]  PHQ-2 Total Score 4 3 1 4 1   PHQ-9 Total Score 10 6 -- 13 --       Assessment and Plan: This patient is a 39 year old female with a history of depression.  She is doing well on her current regimen.  She will continue Trintellix 30 mg daily as well as Effexor XR 300 mg daily both for depression.  She will return to see me in 3 months   20, MD 02/29/2020, 4:07 PM

## 2020-03-02 ENCOUNTER — Institutional Professional Consult (permissible substitution): Payer: BC Managed Care – PPO | Admitting: Neurology

## 2020-03-17 ENCOUNTER — Other Ambulatory Visit: Payer: Self-pay | Admitting: Family Medicine

## 2020-05-15 ENCOUNTER — Encounter: Payer: Self-pay | Admitting: Family Medicine

## 2020-05-16 ENCOUNTER — Telehealth (HOSPITAL_COMMUNITY): Payer: Self-pay | Admitting: Psychiatry

## 2020-05-16 ENCOUNTER — Other Ambulatory Visit: Payer: Self-pay

## 2020-05-16 ENCOUNTER — Encounter: Payer: Self-pay | Admitting: Internal Medicine

## 2020-05-16 ENCOUNTER — Telehealth (INDEPENDENT_AMBULATORY_CARE_PROVIDER_SITE_OTHER): Payer: BC Managed Care – PPO | Admitting: Internal Medicine

## 2020-05-16 DIAGNOSIS — Z7189 Other specified counseling: Secondary | ICD-10-CM

## 2020-05-16 DIAGNOSIS — U071 COVID-19: Secondary | ICD-10-CM | POA: Diagnosis not present

## 2020-05-16 NOTE — Telephone Encounter (Signed)
Called to schedule f/u appt left vm 

## 2020-05-16 NOTE — Progress Notes (Signed)
Virtual Visit via Telephone Note   This visit type was conducted due to national recommendations for restrictions regarding the COVID-19 Pandemic (e.g. social distancing) in an effort to limit this patient's exposure and mitigate transmission in our community.  Due to her co-morbid illnesses, this patient is at least at moderate risk for complications without adequate follow up.  This format is felt to be most appropriate for this patient at this time.  The patient did not have access to video technology/had technical difficulties with video requiring transitioning to audio format only (telephone).  All issues noted in this document were discussed and addressed.  No physical exam could be performed with this format.  Evaluation Performed:  Follow-up visit  Date:  05/16/2020   ID:  Claire [name], DOB [birth year], MRN [id number]  Patient Location: Home Provider Location: Office/Clinic  Location of Patient: Home Location of Provider: Telehealth Consent was obtain for visit to be over via telehealth. I verified that I am speaking with the correct person using two identifiers.  PCP:  Kerri Perches, MD   Chief Complaint:  Fever, chills, myalgias and sore throat  History of Present Illness:    Claire [name] is a 40 y.o. female with PMH of migraine who has a televisit after having a positive rapid test for COVID at home.  She started having mild fatigue and nasal congestion yesterday, and later during the day, she had fever, myalgias, cough and sore throat. She tested positive for COVID with the rapid test. She denies any dyspnea or wheezing currently. She has had 2 doses of COVID vaccine.  The patient does have symptoms concerning for COVID-19 infection (fever, chills, cough, or new shortness of breath).   Past Medical, Surgical, Social History, Allergies, and Medications have been Reviewed.  Past Medical History:  Diagnosis Date  . Acne    facial  . Arthritis   . Migraines    . Nicotine addiction   . Obesity    Past Surgical History:  Procedure Laterality Date  . CHOLECYSTECTOMY       Current Meds  Medication Sig  . Glucosamine HCl (GLUCOSAMINE PO) Take by mouth daily.  Marland Kitchen ibuprofen (ADVIL) 800 MG tablet TAKE 1 TABLET BY MOUTH TWICE DAILY AS NEEDED FOR HEADACHE - MAX USE OF TWICE A WEEK  . loratadine (CLARITIN) 10 MG tablet TAKE ONE TABLET BY MOUTH ONCE DAILY  . Multiple Vitamin (MULTIVITAMIN) tablet Take 1 tablet by mouth daily.  . Norgestimate-Ethinyl Estradiol Triphasic (TRI-SPRINTEC) 0.18/0.215/0.25 MG-35 MCG tablet Take 1 tablet by mouth daily.  . ondansetron (ZOFRAN ODT) 4 MG disintegrating tablet Take 1 tablet (4 mg total) by mouth every 8 (eight) hours as needed for nausea or vomiting.  . propranolol (INDERAL) 20 MG tablet Take 1 tablet by mouth twice daily  . rizatriptan (MAXALT-MLT) 10 MG disintegrating tablet Take 1 tablet (10 mg total) by mouth as needed for migraine. May repeat in 2 hours if needed  . venlafaxine XR (EFFEXOR-XR) 150 MG 24 hr capsule Take 2 capsules (300 mg total) by mouth daily with breakfast.  . vortioxetine HBr (TRINTELLIX) 10 MG TABS tablet Take 1 tablet (10 mg total) by mouth daily.  Marland Kitchen vortioxetine HBr (TRINTELLIX) 20 MG TABS tablet Take 1 tablet (20 mg total) by mouth daily.     Allergies:   Patient has no known allergies.   ROS:   Please see the history of present illness.     All other systems reviewed and are  negative.   Labs/Other Tests and Data Reviewed:    Recent Labs: No results found for requested labs within last 8760 hours.   Recent Lipid Panel Lab Results  Component Value Date/Time   CHOL 217 (H) 11/30/2018 10:58 AM   TRIG 222 (H) 11/30/2018 10:58 AM   HDL 57 11/30/2018 10:58 AM   CHOLHDL 3.8 11/30/2018 10:58 AM   LDLCALC 124 (H) 11/30/2018 10:58 AM    Wt Readings from Last 3 Encounters:  10/26/19 (!) 335 lb (152 kg)  08/02/19 (!) 335 lb (152 kg)  03/16/19 (!) 337 lb (152.9 kg)       ASSESSMENT & PLAN:    COVID-19 infection Advised to continue to self-quarantine Tylenol PRN for fever and myalgias Mucinex PRN for cough and congestion Nasal saline spray for nasal congestion Advised to get immediate medical attention if she has dyspnea or chest pain Educated about normal progression of viral infection.  Time:   Today, I have spent 15 minutes reviewing the chart, including problem list, medications, and with the patient with telehealth technology discussing the above problems.   Medication Adjustments/Labs and Tests Ordered: Current medicines are reviewed at length with the patient today.  Concerns regarding medicines are outlined above.   Tests Ordered: No orders of the defined types were placed in this encounter.   Medication Changes: No orders of the defined types were placed in this encounter.    Note: This dictation was prepared with Dragon dictation along with smaller phrase technology. Similar sounding words can be transcribed inadequately or may not be corrected upon review. Any transcriptional errors that result from this process are unintentional.      Disposition:  Follow up  Signed, Anabel Halon, MD  05/16/2020 10:07 AM     [facility]

## 2020-05-16 NOTE — Patient Instructions (Signed)
Please continue to self-quarantine for at least a week from now or until you are afebrile for 24 hours, whichever is later.  Please take Tylenol up to 3 times in a day for fever or muscle aches. Okay to use Mucinex or Robitussin for cough. Please use nasal saline spray for nasal congestion. Warm water/salt water gargles for sore throat.  If you have shortness or breath or chest pain, please go to ER immediately.

## 2020-05-25 ENCOUNTER — Other Ambulatory Visit: Payer: Self-pay

## 2020-05-25 ENCOUNTER — Encounter: Payer: Self-pay | Admitting: Family Medicine

## 2020-05-25 ENCOUNTER — Telehealth (INDEPENDENT_AMBULATORY_CARE_PROVIDER_SITE_OTHER): Payer: Self-pay | Admitting: Family Medicine

## 2020-05-25 VITALS — Ht 63.0 in | Wt 338.0 lb

## 2020-05-25 DIAGNOSIS — R7301 Impaired fasting glucose: Secondary | ICD-10-CM

## 2020-05-25 DIAGNOSIS — Z8616 Personal history of COVID-19: Secondary | ICD-10-CM

## 2020-05-25 DIAGNOSIS — F329 Major depressive disorder, single episode, unspecified: Secondary | ICD-10-CM

## 2020-05-25 DIAGNOSIS — E559 Vitamin D deficiency, unspecified: Secondary | ICD-10-CM

## 2020-05-25 DIAGNOSIS — Z800 Family history of malignant neoplasm of digestive organs: Secondary | ICD-10-CM

## 2020-05-25 DIAGNOSIS — Z1159 Encounter for screening for other viral diseases: Secondary | ICD-10-CM

## 2020-05-25 DIAGNOSIS — E785 Hyperlipidemia, unspecified: Secondary | ICD-10-CM

## 2020-05-25 MED ORDER — PREDNISONE 5 MG PO TABS: 5.0000 mg | ORAL_TABLET | Freq: Two times a day (BID) | ORAL | 0 refills | Status: AC

## 2020-05-25 NOTE — Patient Instructions (Addendum)
F/U in 6 months, call if you need me before  Please come in next week for flu vaccine, and fasting CBC, lipid, cmp and EGFr, TSH, vit D and HBa1C and hepatitis C screen  5 day course of prednisone is prescribed  It is important that you exercise regularly at least 30 minutes 5 times a week. If you develop chest pain, have severe difficulty breathing, or feel very tired, stop exercising immediately and seek medical attention   Think about what you will eat, plan ahead. Choose " clean, green, fresh or frozen" over canned, processed or packaged foods which are more sugary, salty and fatty. 70 to 75% of food eaten should be vegetables and fruit. Three meals at set times with snacks allowed between meals, but they must be fruit or vegetables. Aim to eat over a 12 hour period , example 7 am to 7 pm, and STOP after  your last meal of the day. Drink water,generally about 64 ounces per day, no other drink is as healthy. Fruit juice is best enjoyed in a healthy way, by EATING the fruit.

## 2020-05-25 NOTE — Progress Notes (Signed)
Virtual Visit via Telephone Note  I connected with Claire [name] on 05/25/20 at  1:20 PM EST by telephone and verified that I am speaking with the correct person using two identifiers.  Location: Patient: work Restaurant manager, fast food: work   I discussed the limitations, risks, security and privacy concerns of performing an evaluation and management service by telephone and the availability of in person appointments. I also discussed with the patient that there may be a patient responsible charge related to this service. The patient expressed understanding and agreed to proceed.   History of Present Illness: Tested positive for covid on Jan 16, quarantined for 5 days , now better back to work on 05/22/2020 C/o nasal congestion and scratchy throat No additional complaints, now works 2 jobs, reports overall doing ok Denies recent fever or chills. Denies sinus pressure, nasal congestion, ear pain or sore throat. Denies chest congestion, productive cough or wheezing. Denies chest pains, palpitations and leg swelling Denies abdominal pain, nausea, vomiting,diarrhea or constipation.   Denies dysuria, frequency, hesitancy or incontinence. Denies joint pain, swelling and limitation in mobility. Denies , seizures, numbness, or tingling. Denies depression, anxiety or insomnia. Denies skin break down or rash.       Observations/Objective: Ht 5\' 3"  (1.6 m)   Wt (!) 338 lb (153.3 kg)   BMI 59.87 kg/m  Good communication with no confusion and intact memory. Alert and oriented x 3 No signs of respiratory distress during speech    Assessment and Plan: Morbid obesity  Patient re-educated about  the importance of commitment to a  minimum of 150 minutes of exercise per week as able.  The importance of healthy food choices with portion control discussed, as well as eating regularly and within a 12 hour window most days. The need to choose "clean , green" food 50 to 75% of the time is discussed, as well as  to make water the primary drink and set a goal of 64 ounces water daily.    Weight /BMI 05/25/2020 10/26/2019 08/02/2019  WEIGHT 338 lb 335 lb 335 lb  HEIGHT 5\' 3"  5\' 3"  5\' 3"   BMI 59.87 kg/m2 59.34 kg/m2 59.34 kg/m2  Some encounter information is confidential and restricted. Go to Review Flowsheets activity to see all data.      Depression Improved and managed by psych PHQ 2 score of zero  Dyslipidemia, goal LDL below 100 Hyperlipidemia:Low fat diet discussed and encouraged.   Lipid Panel  Lab Results  Component Value Date   CHOL 217 (H) 11/30/2018   HDL 57 11/30/2018   LDLCALC 124 (H) 11/30/2018   TRIG 222 (H) 11/30/2018   CHOLHDL 3.8 11/30/2018   Updated lab needed, not at goal    Personal history of COVID-19 Essentially fully reovered and returned to work, slight scratchy throat and nasal congestion  Chronic migraine Needs reassessment by Neurology , slight increase in frequency , medication management per Neurology  FH: colon cancer in relative <86 years old Refer to GI for colonscopy due to family history    Follow Up Instructions:    I discussed the assessment and treatment plan with the patient. The patient was provided an opportunity to ask questions and all were answered. The patient agreed with the plan and demonstrated an understanding of the instructions.   The patient was advised to call back or seek an in-person evaluation if the symptoms worsen or if the condition fails to improve as anticipated.  I provided 22 minutes of non-face-to-face time during this  encounter.   Tula Nakayama, MD

## 2020-05-27 ENCOUNTER — Other Ambulatory Visit: Payer: Self-pay | Admitting: Family Medicine

## 2020-05-29 ENCOUNTER — Encounter: Payer: Self-pay | Admitting: Family Medicine

## 2020-05-29 NOTE — Assessment & Plan Note (Signed)
Needs reassessment by Neurology , slight increase in frequency , medication management per Neurology

## 2020-05-29 NOTE — Assessment & Plan Note (Signed)
Essentially fully reovered and returned to work, slight scratchy throat and nasal congestion

## 2020-05-29 NOTE — Assessment & Plan Note (Signed)
  Patient re-educated about  the importance of commitment to a  minimum of 150 minutes of exercise per week as able.  The importance of healthy food choices with portion control discussed, as well as eating regularly and within a 12 hour window most days. The need to choose "clean , green" food 50 to 75% of the time is discussed, as well as to make water the primary drink and set a goal of 64 ounces water daily.    Weight /BMI 05/25/2020 10/26/2019 08/02/2019  WEIGHT 338 lb 335 lb 335 lb  HEIGHT 5\' 3"  5\' 3"  5\' 3"   BMI 59.87 kg/m2 59.34 kg/m2 59.34 kg/m2  Some encounter information is confidential and restricted. Go to Review Flowsheets activity to see all data.

## 2020-05-29 NOTE — Assessment & Plan Note (Signed)
Improved and managed by psych PHQ 2 score of zero

## 2020-05-29 NOTE — Assessment & Plan Note (Signed)
Hyperlipidemia:Low fat diet discussed and encouraged.   Lipid Panel  Lab Results  Component Value Date   CHOL 217 (H) 11/30/2018   HDL 57 11/30/2018   LDLCALC 124 (H) 11/30/2018   TRIG 222 (H) 11/30/2018   CHOLHDL 3.8 11/30/2018   Updated lab needed, not at goal

## 2020-05-29 NOTE — Assessment & Plan Note (Signed)
Refer to GI for colonscopy due to family history

## 2020-06-05 ENCOUNTER — Encounter: Payer: Self-pay | Admitting: Internal Medicine

## 2020-06-11 ENCOUNTER — Encounter: Payer: Self-pay | Admitting: Family Medicine

## 2020-06-12 ENCOUNTER — Other Ambulatory Visit: Payer: Self-pay | Admitting: *Deleted

## 2020-06-12 DIAGNOSIS — R300 Dysuria: Secondary | ICD-10-CM

## 2020-06-13 ENCOUNTER — Other Ambulatory Visit: Payer: Self-pay

## 2020-06-13 ENCOUNTER — Encounter: Payer: Self-pay | Admitting: Internal Medicine

## 2020-06-13 ENCOUNTER — Telehealth (INDEPENDENT_AMBULATORY_CARE_PROVIDER_SITE_OTHER): Payer: Self-pay | Admitting: Internal Medicine

## 2020-06-13 DIAGNOSIS — B379 Candidiasis, unspecified: Secondary | ICD-10-CM

## 2020-06-13 DIAGNOSIS — N3000 Acute cystitis without hematuria: Secondary | ICD-10-CM

## 2020-06-13 DIAGNOSIS — R300 Dysuria: Secondary | ICD-10-CM

## 2020-06-13 LAB — POCT URINALYSIS DIP (CLINITEK)
Bilirubin, UA: NEGATIVE
Glucose, UA: NEGATIVE mg/dL
Ketones, POC UA: NEGATIVE mg/dL
Nitrite, UA: POSITIVE — AB
POC PROTEIN,UA: 30 — AB
Spec Grav, UA: >=1.03 — AB (ref 1.010–1.025)
Urobilinogen, UA: 0.2 E.U./dL
pH, UA: 5.5 (ref 5.0–8.0)

## 2020-06-13 MED ORDER — FLUCONAZOLE 150 MG PO TABS: 150.0000 mg | ORAL_TABLET | Freq: Once | ORAL | 0 refills | Status: AC

## 2020-06-13 MED ORDER — SULFAMETHOXAZOLE-TRIMETHOPRIM 800-160 MG PO TABS: 1.0000 | ORAL_TABLET | Freq: Two times a day (BID) | ORAL | 0 refills | Status: DC

## 2020-06-13 NOTE — Patient Instructions (Signed)
Urinary Tract Infection, Adult A urinary tract infection (UTI) is an infection of any part of the urinary tract. The urinary tract includes:  The kidneys.  The ureters.  The bladder.  The urethra. These organs make, store, and get rid of pee (urine) in the body. What are the causes? This infection is caused by germs (bacteria) in your genital area. These germs grow and cause swelling (inflammation) of your urinary tract. What increases the risk? The following factors may make you more likely to develop this condition:  Using a small, thin tube (catheter) to drain pee.  Not being able to control when you pee or poop (incontinence).  Being female. If you are female, these things can increase the risk: ? Using these methods to prevent pregnancy:  A medicine that kills sperm (spermicide).  A device that blocks sperm (diaphragm). ? Having low levels of a female hormone (estrogen). ? Being pregnant. You are more likely to develop this condition if:  You have genes that add to your risk.  You are sexually active.  You take antibiotic medicines.  You have trouble peeing because of: ? A prostate that is bigger than normal, if you are female. ? A blockage in the part of your body that drains pee from the bladder. ? A kidney stone. ? A nerve condition that affects your bladder. ? Not getting enough to drink. ? Not peeing often enough.  You have other conditions, such as: ? Diabetes. ? A weak disease-fighting system (immune system). ? Sickle cell disease. ? Gout. ? Injury of the spine. What are the signs or symptoms? Symptoms of this condition include:  Needing to pee right away.  Peeing small amounts often.  Pain or burning when peeing.  Blood in the pee.  Pee that smells bad or not like normal.  Trouble peeing.  Pee that is cloudy.  Fluid coming from the vagina, if you are female.  Pain in the belly or lower back. Other symptoms include:  Vomiting.  Not  feeling hungry.  Feeling mixed up (confused). This may be the first symptom in older adults.  Being tired and grouchy (irritable).  A fever.  Watery poop (diarrhea). How is this treated?  Taking antibiotic medicine.  Taking other medicines.  Drinking enough water. In some cases, you may need to see a specialist. Follow these instructions at home: Medicines  Take over-the-counter and prescription medicines only as told by your doctor.  If you were prescribed an antibiotic medicine, take it as told by your doctor. Do not stop taking it even if you start to feel better. General instructions  Make sure you: ? Pee until your bladder is empty. ? Do not hold pee for a long time. ? Empty your bladder after sex. ? Wipe from front to back after peeing or pooping if you are a female. Use each tissue one time when you wipe.  Drink enough fluid to keep your pee pale yellow.  Keep all follow-up visits.   Contact a doctor if:  You do not get better after 1-2 days.  Your symptoms go away and then come back. Get help right away if:  You have very bad back pain.  You have very bad pain in your lower belly.  You have a fever.  You have chills.  You feeling like you will vomit or you vomit. Summary  A urinary tract infection (UTI) is an infection of any part of the urinary tract.  This condition is caused by   germs in your genital area.  There are many risk factors for a UTI.  Treatment includes antibiotic medicines.  Drink enough fluid to keep your pee pale yellow. This information is not intended to replace advice given to you by your health care provider. Make sure you discuss any questions you have with your health care provider. Document Revised: 11/26/2019 Document Reviewed: 11/26/2019 Elsevier Patient Education  2021 [employer] Inc[employer]  

## 2020-06-13 NOTE — Progress Notes (Signed)
Virtual Visit via Telephone Note   This visit type was conducted due to national recommendations for restrictions regarding the COVID-19 Pandemic (e.g. social distancing) in an effort to limit this patient's exposure and mitigate transmission in our community.  Due to her co-morbid illnesses, this patient is at least at moderate risk for complications without adequate follow up.  This format is felt to be most appropriate for this patient at this time.  The patient did not have access to video technology/had technical difficulties with video requiring transitioning to audio format only (telephone).  All issues noted in this document were discussed and addressed.  No physical exam could be performed with this format.  Evaluation Performed:  Follow-up visit  Date:  06/13/2020   ID:  Claire [name], DOB 12-17-1980, MRN [id number]  Patient Location: Home Provider Location: Office/Clinic  Participants: Patient Location of Patient: Home Location of Provider: Telehealth Consent was obtain for visit to be over via telehealth. I verified that I am speaking with the correct person using two identifiers.  PCP:  Kerri Perches, MD   Chief Complaint:  Dys  History of Present Illness:    Claire [name] is a 40 y.o. female who has a televisit for c/o urinary frequency and dysuria for 4 days. She had fever on the first day, but has not had it since then. She also mentions having back pain, which is constant, dull, on both sides. She also c/o nausea. She denies any hematuria. She denies any urinary hesitance or incontinence.  The patient does not have symptoms concerning for COVID-19 infection (fever, chills, cough, or new shortness of breath).   Past Medical, Surgical, Social History, Allergies, and Medications have been Reviewed.  Past Medical History:  Diagnosis Date  . Acne    facial  . Arthritis   . Depression    Phreesia 05/24/2020  . Migraines   . Nicotine addiction   . Obesity     Past Surgical History:  Procedure Laterality Date  . CHOLECYSTECTOMY       Current Meds  Medication Sig  . Glucosamine HCl (GLUCOSAMINE PO) Take by mouth daily.  Marland Kitchen ibuprofen (ADVIL) 800 MG tablet TAKE 1 TABLET BY MOUTH TWICE DAILY AS NEEDED FOR HEADACHE --  MAXIMUM  USE  OF  TWICE  A  WEEK  . loratadine (CLARITIN) 10 MG tablet TAKE ONE TABLET BY MOUTH ONCE DAILY  . Multiple Vitamin (MULTIVITAMIN) tablet Take 1 tablet by mouth daily.  . Norgestimate-Ethinyl Estradiol Triphasic (TRI-SPRINTEC) 0.18/0.215/0.25 MG-35 MCG tablet Take 1 tablet by mouth daily.  . ondansetron (ZOFRAN ODT) 4 MG disintegrating tablet Take 1 tablet (4 mg total) by mouth every 8 (eight) hours as needed for nausea or vomiting.  . propranolol (INDERAL) 20 MG tablet Take 1 tablet by mouth twice daily  . rizatriptan (MAXALT-MLT) 10 MG disintegrating tablet Take 1 tablet (10 mg total) by mouth as needed for migraine. May repeat in 2 hours if needed  . venlafaxine XR (EFFEXOR-XR) 150 MG 24 hr capsule Take 2 capsules (300 mg total) by mouth daily with breakfast.  . vortioxetine HBr (TRINTELLIX) 10 MG TABS tablet Take 1 tablet (10 mg total) by mouth daily.  Marland Kitchen vortioxetine HBr (TRINTELLIX) 20 MG TABS tablet Take 1 tablet (20 mg total) by mouth daily.     Allergies:   Patient has no known allergies.   ROS:   Please see the history of present illness.     All other systems reviewed  and are negative.   Labs/Other Tests and Data Reviewed:    Recent Labs: No results found for requested labs within last 8760 hours.   Recent Lipid Panel Lab Results  Component Value Date/Time   CHOL 217 (H) 11/30/2018 10:58 AM   TRIG 222 (H) 11/30/2018 10:58 AM   HDL 57 11/30/2018 10:58 AM   CHOLHDL 3.8 11/30/2018 10:58 AM   LDLCALC 124 (H) 11/30/2018 10:58 AM    Wt Readings from Last 3 Encounters:  05/25/20 (!) 338 lb (153.3 kg)  10/26/19 (!) 335 lb (152 kg)  08/02/19 (!) 335 lb (152 kg)     ASSESSMENT & PLAN:    UTI UA  showed LE and nitrite with RBCs Check urine culture Bactrim Increase water intake Tylenol PRN  Time:   Today, I have spent 13 minutes reviewing the chart, including problem list, medications, and with the patient with telehealth technology discussing the above problems.   Medication Adjustments/Labs and Tests Ordered: Current medicines are reviewed at length with the patient today.  Concerns regarding medicines are outlined above.   Tests Ordered: Orders Placed This Encounter  Procedures  . POCT URINALYSIS DIP (CLINITEK)    Medication Changes: No orders of the defined types were placed in this encounter.    Note: This dictation was prepared with Dragon dictation along with smaller phrase technology. Similar sounding words can be transcribed inadequately or may not be corrected upon review. Any transcriptional errors that result from this process are unintentional.      Disposition:  Follow up  Signed, Anabel Halon, MD  06/13/2020 4:34 PM     [facility]

## 2020-06-15 LAB — URINE CULTURE

## 2020-06-18 ENCOUNTER — Encounter: Payer: Self-pay | Admitting: Family Medicine

## 2020-06-19 ENCOUNTER — Encounter: Payer: Self-pay | Admitting: Internal Medicine

## 2020-06-19 ENCOUNTER — Telehealth (INDEPENDENT_AMBULATORY_CARE_PROVIDER_SITE_OTHER): Payer: Self-pay | Admitting: Internal Medicine

## 2020-06-19 ENCOUNTER — Other Ambulatory Visit: Payer: Self-pay

## 2020-06-19 DIAGNOSIS — N3000 Acute cystitis without hematuria: Secondary | ICD-10-CM

## 2020-06-19 DIAGNOSIS — R109 Unspecified abdominal pain: Secondary | ICD-10-CM

## 2020-06-19 MED ORDER — DICYCLOMINE HCL 20 MG PO TABS: 20.0000 mg | ORAL_TABLET | Freq: Three times a day (TID) | ORAL | 0 refills | Status: DC | PRN

## 2020-06-19 NOTE — Telephone Encounter (Signed)
Made work in appointment today

## 2020-06-19 NOTE — Progress Notes (Signed)
Virtual Visit via Telephone Note   This visit type was conducted due to national recommendations for restrictions regarding the COVID-19 Pandemic (e.g. social distancing) in an effort to limit this patient's exposure and mitigate transmission in our community.  Due to her co-morbid illnesses, this patient is at least at moderate risk for complications without adequate follow up.  This format is felt to be most appropriate for this patient at this time.  The patient did not have access to video technology/had technical difficulties with video requiring transitioning to audio format only (telephone).  All issues noted in this document were discussed and addressed.  No physical exam could be performed with this format.   Evaluation Performed:  Follow-up visit  Date:  06/19/2020   ID:  Claire [name], DOB March 15, 1981, MRN [id number]  Patient Location: Home Provider Location: Office/Clinic  Participants: Patient Location of Patient: Home Location of Provider: Telehealth Consent was obtain for visit to be over via telehealth. I verified that I am speaking with the correct person using two identifiers.  PCP:  Kerri Perches, MD   Chief Complaint:  Stomach cramps  History of Present Illness:    Claire [name] is a 40 y.o. female who has a televisit for c/o stomach cramps and feeling tired since she started taking Bactrim for UTI. Her urinary frequency and dysuria have improved, but still has urgency at times. She mentioned having fever, but later stated that her T was ~65 F. She c/o stomach cramps and nausea, but denies diarrhea, melena or hematochezia.  The patient does not have symptoms concerning for COVID-19 infection (fever, chills, cough, or new shortness of breath).   Past Medical, Surgical, Social History, Allergies, and Medications have been Reviewed.  Past Medical History:  Diagnosis Date  . Acne    facial  . Arthritis   . Depression    Phreesia 05/24/2020  . Migraines    . Nicotine addiction   . Obesity    Past Surgical History:  Procedure Laterality Date  . CHOLECYSTECTOMY       Current Meds  Medication Sig  . Glucosamine HCl (GLUCOSAMINE PO) Take by mouth daily.  Marland Kitchen ibuprofen (ADVIL) 800 MG tablet TAKE 1 TABLET BY MOUTH TWICE DAILY AS NEEDED FOR HEADACHE --  MAXIMUM  USE  OF  TWICE  A  WEEK  . loratadine (CLARITIN) 10 MG tablet TAKE ONE TABLET BY MOUTH ONCE DAILY  . Multiple Vitamin (MULTIVITAMIN) tablet Take 1 tablet by mouth daily.  . Norgestimate-Ethinyl Estradiol Triphasic (TRI-SPRINTEC) 0.18/0.215/0.25 MG-35 MCG tablet Take 1 tablet by mouth daily.  . ondansetron (ZOFRAN ODT) 4 MG disintegrating tablet Take 1 tablet (4 mg total) by mouth every 8 (eight) hours as needed for nausea or vomiting.  . propranolol (INDERAL) 20 MG tablet Take 1 tablet by mouth twice daily  . rizatriptan (MAXALT-MLT) 10 MG disintegrating tablet Take 1 tablet (10 mg total) by mouth as needed for migraine. May repeat in 2 hours if needed  . sulfamethoxazole-trimethoprim (BACTRIM DS) 800-160 MG tablet Take 1 tablet by mouth 2 (two) times daily.  Marland Kitchen venlafaxine XR (EFFEXOR-XR) 150 MG 24 hr capsule Take 2 capsules (300 mg total) by mouth daily with breakfast.  . vortioxetine HBr (TRINTELLIX) 10 MG TABS tablet Take 1 tablet (10 mg total) by mouth daily.  Marland Kitchen vortioxetine HBr (TRINTELLIX) 20 MG TABS tablet Take 1 tablet (20 mg total) by mouth daily.     Allergies:   Patient has no known allergies.  ROS:   Please see the history of present illness.     All other systems reviewed and are negative.   Labs/Other Tests and Data Reviewed:    Recent Labs: No results found for requested labs within last 8760 hours.   Recent Lipid Panel Lab Results  Component Value Date/Time   CHOL 217 (H) 11/30/2018 10:58 AM   TRIG 222 (H) 11/30/2018 10:58 AM   HDL 57 11/30/2018 10:58 AM   CHOLHDL 3.8 11/30/2018 10:58 AM   LDLCALC 124 (H) 11/30/2018 10:58 AM    Wt Readings from Last 3  Encounters:  05/25/20 (!) 338 lb (153.3 kg)  10/26/19 (!) 335 lb (152 kg)  08/02/19 (!) 335 lb (152 kg)      ASSESSMENT & PLAN:    Stomach cramps Could be due to antibiotics Bentyl PRN Denies diarrhea, melena or hematochezia  UTI Symptoms better with Bactrim Urine culture showed E coli sensitive to Bactrim Advised to continue proper hydration  Time:   Today, I have spent 13 minutes reviewing the chart, including problem list, medications, and with the patient with telehealth technology discussing the above problems.   Medication Adjustments/Labs and Tests Ordered: Current medicines are reviewed at length with the patient today.  Concerns regarding medicines are outlined above.   Tests Ordered: No orders of the defined types were placed in this encounter.   Medication Changes: No orders of the defined types were placed in this encounter.    Note: This dictation was prepared with Dragon dictation along with smaller phrase technology. Similar sounding words can be transcribed inadequately or may not be corrected upon review. Any transcriptional errors that result from this process are unintentional.      Disposition:  Follow up  Signed, Anabel Halon, MD  06/19/2020 1:59 PM     [facility]

## 2020-06-19 NOTE — Telephone Encounter (Signed)
LM for the pt to call the office

## 2020-06-27 ENCOUNTER — Other Ambulatory Visit (HOSPITAL_COMMUNITY): Payer: Self-pay | Admitting: Psychiatry

## 2020-06-27 NOTE — Telephone Encounter (Signed)
Call for appt

## 2020-07-11 ENCOUNTER — Encounter: Payer: Self-pay | Admitting: Gastroenterology

## 2020-07-11 NOTE — Progress Notes (Deleted)
Referring Provider: Kerri Perches, MD Primary Care Physician:  Kerri Perches, MD Primary Gastroenterologist:  Dr. Jena Gauss  No chief complaint on file.   HPI:   Claire [name] is a 40 y.o. female presenting today at the request of Lodema Hong Milus Mallick, MD for consult colonoscopy.  Last colonoscopy in July 2011 due to paper hematochezia with minimal anal canal hemorrhoids, otherwise normal rectum and colon.  Recommended repeat colonoscopy at age 26 due to family history.    Past Medical History:  Diagnosis Date  . Acne    facial  . Arthritis   . Depression    Phreesia 05/24/2020  . Migraines   . Nicotine addiction   . Obesity     Past Surgical History:  Procedure Laterality Date  . CHOLECYSTECTOMY      Current Outpatient Medications  Medication Sig Dispense Refill  . dicyclomine (BENTYL) 20 MG tablet Take 1 tablet (20 mg total) by mouth 3 (three) times daily as needed for spasms. 30 tablet 0  . Glucosamine HCl (GLUCOSAMINE PO) Take by mouth daily.    Marland Kitchen ibuprofen (ADVIL) 800 MG tablet TAKE 1 TABLET BY MOUTH TWICE DAILY AS NEEDED FOR HEADACHE --  MAXIMUM  USE  OF  TWICE  A  WEEK 30 tablet 0  . loratadine (CLARITIN) 10 MG tablet TAKE ONE TABLET BY MOUTH ONCE DAILY 90 tablet 3  . Multiple Vitamin (MULTIVITAMIN) tablet Take 1 tablet by mouth daily.    . Norgestimate-Ethinyl Estradiol Triphasic (TRI-SPRINTEC) 0.18/0.215/0.25 MG-35 MCG tablet Take 1 tablet by mouth daily. 3 Package 3  . ondansetron (ZOFRAN ODT) 4 MG disintegrating tablet Take 1 tablet (4 mg total) by mouth every 8 (eight) hours as needed for nausea or vomiting. 20 tablet 0  . propranolol (INDERAL) 20 MG tablet Take 1 tablet by mouth twice daily 60 tablet 5  . rizatriptan (MAXALT-MLT) 10 MG disintegrating tablet Take 1 tablet (10 mg total) by mouth as needed for migraine. May repeat in 2 hours if needed 10 tablet 3  . sulfamethoxazole-trimethoprim (BACTRIM DS) 800-160 MG tablet Take 1 tablet by mouth 2 (two)  times daily. 10 tablet 0  . TRINTELLIX 20 MG TABS tablet Take 1 tablet by mouth once daily 30 tablet 0  . venlafaxine XR (EFFEXOR-XR) 150 MG 24 hr capsule Take 2 capsules (300 mg total) by mouth daily with breakfast. 90 capsule 2  . vortioxetine HBr (TRINTELLIX) 10 MG TABS tablet Take 1 tablet (10 mg total) by mouth daily. 30 tablet 2   No current facility-administered medications for this visit.    Allergies as of 07/12/2020  . (No Known Allergies)    Family History  Problem Relation Age of Onset  . Hypertension Mother   . Cancer Mother 16       colon  . Obesity Mother   . Hypertension Father   . Diabetes Father   . Hyperlipidemia Father   . Obesity Father     Social History   Socioeconomic History  . Marital status: Single    Spouse name: Not on file  . Number of children: 0  . Years of education: Bachelors  . Highest education level: Not on file  Occupational History  . 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 Smoking status: Former Smoker    Quit date: 11/13/2008    Years since quitting: 11.6  . Smokeless tobacco: Never Used  . Tobacco comment: Quit 2008, 04-02-2016 per pt stopped 2010  Vaping Use  .  Vaping Use: Never used  Substance and Sexual Activity  . Alcohol use: No    Alcohol/week: 0.0 standard drinks    Comment: occasional use  a beer once every 3 months or so, 04-02-2016 per pt occas.   . Drug use: No    Comment: 04-02-2016 per pt no  . Sexual activity: Not Currently    Birth control/protection: Pill  Other Topics Concern  . Not on file  Social History Narrative   Lives at home alone.   Right-handed.   Occasional use of caffeine.   Social Determinants of Health   Financial Resource Strain: Low Risk   . Difficulty of Paying Living Expenses: Not very hard  Food Insecurity: Food Insecurity Present  . Worried About Programme researcher, broadcasting/film/video in the Last Year: Sometimes true  . Ran Out of Food in the Last Year: Never true  Transportation Needs: No  Transportation Needs  . Lack of Transportation (Medical): No  . Lack of Transportation (Non-Medical): No  Physical Activity: Inactive  . Days of Exercise per Week: 0 days  . Minutes of Exercise per Session: 0 min  Stress: Stress Concern Present  . Feeling of Stress : Rather much  Social Connections: Socially Isolated  . Frequency of Communication with Friends and Family: More than three times a week  . Frequency of Social Gatherings with Friends and Family: Twice a week  . Attends Religious Services: Never  . Active Member of Clubs or Organizations: No  . Attends Banker Meetings: Never  . Marital Status: Never married  Intimate Partner Violence: Not At Risk  . Fear of Current or Ex-Partner: No  . Emotionally Abused: No  . Physically Abused: No  . Sexually Abused: No    Review of Systems: Gen: Denies any fever, chills, fatigue, weight loss, lack of appetite.  CV: Denies chest pain, heart palpitations, peripheral edema, syncope.  Resp: Denies shortness of breath at rest or with exertion. Denies wheezing or cough.  GI: Denies dysphagia or odynophagia. Denies jaundice, hematemesis, fecal incontinence. GU : Denies urinary burning, urinary frequency, urinary hesitancy MS: Denies joint pain, muscle weakness, cramps, or limitation of movement.  Derm: Denies rash, itching, dry skin Psych: Denies depression, anxiety, memory loss, and confusion Heme: Denies bruising, bleeding, and enlarged lymph nodes.  Physical Exam: There were no vitals taken for this visit. General:   Alert and oriented. Pleasant and cooperative. Well-nourished and well-developed.  Head:  Normocephalic and atraumatic. Eyes:  Without icterus, sclera clear and conjunctiva pink.  Ears:  Normal auditory acuity. Nose:  No deformity, discharge,  or lesions. Mouth:  No deformity or lesions, oral mucosa pink.  Neck:  Supple, without mass or thyromegaly. Lungs:  Clear to auscultation bilaterally. No wheezes,  rales, or rhonchi. No distress.  Heart:  S1, S2 present without murmurs appreciated.  Abdomen:  +BS, soft, non-tender and non-distended. No HSM noted. No guarding or rebound. No masses appreciated.  Rectal:  Deferred  Msk:  Symmetrical without gross deformities. Normal posture. Pulses:  Normal pulses noted. Extremities:  Without clubbing or edema. Neurologic:  Alert and  oriented x4;  grossly normal neurologically. Skin:  Intact without significant lesions or rashes. Cervical Nodes:  No significant cervical adenopathy. Psych:  Alert and cooperative. Normal mood and affect.

## 2020-07-12 ENCOUNTER — Ambulatory Visit: Payer: Self-pay | Admitting: Gastroenterology

## 2020-07-18 ENCOUNTER — Other Ambulatory Visit: Payer: Self-pay | Admitting: Family Medicine

## 2020-07-26 ENCOUNTER — Other Ambulatory Visit (HOSPITAL_COMMUNITY): Payer: Self-pay | Admitting: Psychiatry

## 2020-07-26 ENCOUNTER — Other Ambulatory Visit: Payer: Self-pay

## 2020-07-26 ENCOUNTER — Encounter (HOSPITAL_COMMUNITY): Payer: Self-pay | Admitting: Psychiatry

## 2020-07-26 ENCOUNTER — Telehealth (INDEPENDENT_AMBULATORY_CARE_PROVIDER_SITE_OTHER): Payer: BC Managed Care – PPO | Admitting: Psychiatry

## 2020-07-26 DIAGNOSIS — F321 Major depressive disorder, single episode, moderate: Secondary | ICD-10-CM

## 2020-07-26 MED ORDER — VORTIOXETINE HBR 10 MG PO TABS: 10.0000 mg | ORAL_TABLET | Freq: Every day | ORAL | 4 refills | Status: DC

## 2020-07-26 MED ORDER — VORTIOXETINE HBR 20 MG PO TABS: 20.0000 mg | ORAL_TABLET | Freq: Every day | ORAL | 4 refills | Status: DC

## 2020-07-26 MED ORDER — VENLAFAXINE HCL ER 150 MG PO CP24: 300.0000 mg | ORAL_CAPSULE | Freq: Every day | ORAL | 2 refills | Status: DC

## 2020-07-26 NOTE — Progress Notes (Signed)
Virtual Visit via Telephone Note  I connected with Claire [name] on 07/26/20 at  4:20 PM EDT by telephone and verified that I am speaking with the correct person using two identifiers.  Location: Patient: home Provider: home   I discussed the limitations, risks, security and privacy concerns of performing an evaluation and management service by telephone and the availability of in person appointments. I also discussed with the patient that there may be a patient responsible charge related to this service. The patient expressed understanding and agreed to proceed.    I discussed the assessment and treatment plan with the patient. The patient was provided an opportunity to ask questions and all were answered. The patient agreed with the plan and demonstrated an understanding of the instructions.   The patient was advised to call back or seek an in-person evaluation if the symptoms worsen or if the condition fails to improve as anticipated.  I provided 15 minutes of non-face-to-face time during this encounter.   Claire Ruder, MD  [facility] MD/PA/NP OP Progress Note  07/26/2020 4:37 PM [name]  MRN:  [id number]  Chief Complaint:  Chief Complaint    Depression; Follow-up     HPI: This patient is a 40 year old single white female who lives alone in [place]. She works as a Sports administrator at a [employer] .  She also works part-time in a Neurosurgeon.  The patient returns for follow-up after about 4 months.  She is still working hard at both jobs.  She states that her mood has been really good.  She is enjoying most of what she does although the school can be stressful.  She is sleeping well most of the time.  Her energy is good.  She denies significant anxiety.  She denies thoughts of suicide or self-harm.  She feels that her medications are working well for her right now Visit Diagnosis:    ICD-10-CM   1. Moderate single current episode of major depressive disorder (HCC)   F32.1 venlafaxine XR (EFFEXOR-XR) 150 MG 24 hr capsule    Past Psychiatric History: none  Past Medical History:  Past Medical History:  Diagnosis Date  . Acne    facial  . Arthritis   . Depression    Phreesia 05/24/2020  . Migraines   . Nicotine addiction   . Obesity     Past Surgical History:  Procedure Laterality Date  . CHOLECYSTECTOMY    . COLONOSCOPY  10/2009   Dr. Jena Gauss; minimal anal canal hemorrhoids, otherwise normal rectum and colon.  Recommended repeat at age 42 due to family history.    Family Psychiatric History: see below  Family History:  Family History  Problem Relation Age of Onset  . Hypertension Mother   . Cancer Mother 61       colon  . Obesity Mother   . Hypertension Father   . Diabetes Father   . Hyperlipidemia Father   . Obesity Father     Social History:  Social History   Socioeconomic History  . Marital status: Single    Spouse name: Not on file  . Number of children: 0  . Years of education: Bachelors  . Highest education level: Not on file  Occupational History  . 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 Smoking status: Former Smoker    Quit date: 11/13/2008    Years since quitting: 11.7  . Smokeless tobacco: Never Used  . Tobacco comment: Quit 2008, 04-02-2016 per  pt stopped 2010  Vaping Use  . Vaping Use: Never used  Substance and Sexual Activity  . Alcohol use: No    Alcohol/week: 0.0 standard drinks    Comment: occasional use  a beer once every 3 months or so, 04-02-2016 per pt occas.   . Drug use: No    Comment: 04-02-2016 per pt no  . Sexual activity: Not Currently    Birth control/protection: Pill  Other Topics Concern  . Not on file  Social History Narrative   Lives at home alone.   Right-handed.   Occasional use of caffeine.   Social Determinants of Health   Financial Resource Strain: Low Risk   . Difficulty of Paying Living Expenses: Not very hard  Food Insecurity: Food Insecurity Present  . Worried  About Programme researcher, broadcasting/film/video in the Last Year: Sometimes true  . Ran Out of Food in the Last Year: Never true  Transportation Needs: No Transportation Needs  . Lack of Transportation (Medical): No  . Lack of Transportation (Non-Medical): No  Physical Activity: Inactive  . Days of Exercise per Week: 0 days  . Minutes of Exercise per Session: 0 min  Stress: Stress Concern Present  . Feeling of Stress : Rather much  Social Connections: Socially Isolated  . Frequency of Communication with Friends and Family: More than three times a week  . Frequency of Social Gatherings with Friends and Family: Twice a week  . Attends Religious Services: Never  . Active Member of Clubs or Organizations: No  . Attends Banker Meetings: Never  . Marital Status: Never married    Allergies: No Known Allergies  Metabolic Disorder Labs: Lab Results  Component Value Date   HGBA1C 5.3 11/30/2018   MPG 105 11/30/2018   MPG 103 11/06/2016   No results found for: PROLACTIN Lab Results  Component Value Date   CHOL 217 (H) 11/30/2018   TRIG 222 (H) 11/30/2018   HDL 57 11/30/2018   CHOLHDL 3.8 11/30/2018   VLDL 31 (H) 11/06/2016   LDLCALC 124 (H) 11/30/2018   LDLCALC 125 (H) 11/04/2017   Lab Results  Component Value Date   TSH 1.77 11/30/2018   TSH 2.55 11/04/2017    Therapeutic Level Labs: No results found for: LITHIUM No results found for: VALPROATE No components found for:  CBMZ  Current Medications: Current Outpatient Medications  Medication Sig Dispense Refill  . dicyclomine (BENTYL) 20 MG tablet Take 1 tablet (20 mg total) by mouth 3 (three) times daily as needed for spasms. 30 tablet 0  . Glucosamine HCl (GLUCOSAMINE PO) Take by mouth daily.    Marland Kitchen ibuprofen (ADVIL) 800 MG tablet TAKE 1 TABLET BY MOUTH TWICE DAILY AS NEEDED FOR HEADACHE --  MAXIMUM  USE  OF  TWICE  A  WEEK 30 tablet 0  . loratadine (CLARITIN) 10 MG tablet TAKE ONE TABLET BY MOUTH ONCE DAILY 90 tablet 3  .  Multiple Vitamin (MULTIVITAMIN) tablet Take 1 tablet by mouth daily.    . Norgestimate-Ethinyl Estradiol Triphasic (TRI-SPRINTEC) 0.18/0.215/0.25 MG-35 MCG tablet Take 1 tablet by mouth daily. 3 Package 3  . ondansetron (ZOFRAN ODT) 4 MG disintegrating tablet Take 1 tablet (4 mg total) by mouth every 8 (eight) hours as needed for nausea or vomiting. 20 tablet 0  . propranolol (INDERAL) 20 MG tablet Take 1 tablet by mouth twice daily 180 tablet 0  . rizatriptan (MAXALT-MLT) 10 MG disintegrating tablet Take 1 tablet (10 mg total) by mouth as needed  for migraine. May repeat in 2 hours if needed 10 tablet 3  . sulfamethoxazole-trimethoprim (BACTRIM DS) 800-160 MG tablet Take 1 tablet by mouth 2 (two) times daily. 10 tablet 0  . venlafaxine XR (EFFEXOR-XR) 150 MG 24 hr capsule Take 2 capsules (300 mg total) by mouth daily with breakfast. 90 capsule 2  . vortioxetine HBr (TRINTELLIX) 10 MG TABS tablet Take 1 tablet (10 mg total) by mouth daily. 30 tablet 4  . vortioxetine HBr (TRINTELLIX) 20 MG TABS tablet Take 1 tablet (20 mg total) by mouth daily. 30 tablet 4   No current facility-administered medications for this visit.     Musculoskeletal: Strength & Muscle Tone: within normal limits Gait & Station: normal Patient leans: N/A  Psychiatric Specialty Exam: Review of Systems  All other systems reviewed and are negative.   There were no vitals taken for this visit.There is no height or weight on file to calculate BMI.  General Appearance: NA  Eye Contact:  NA  Speech:  Clear and Coherent  Volume:  Normal  Mood:  Euthymic  Affect:  NA  Thought Process:  Goal Directed  Orientation:  Full (Time, Place, and Person)  Thought Content: WDL   Suicidal Thoughts:  No  Homicidal Thoughts:  No  Memory:  Immediate;   Good Recent;   Good Remote;   Good  Judgement:  Good  Insight:  Good  Psychomotor Activity:  Normal  Concentration:  Concentration: Good and Attention Span: Good  Recall:  Good   Fund of Knowledge: Good  Language: Good  Akathisia:  No  Handed:  Right  AIMS (if indicated): not done  Assets:  Communication Skills Desire for Improvement Physical Health Resilience Social Support Talents/Skills  ADL's:  Intact  Cognition: WNL  Sleep:  Good   Screenings: GAD-7   Flowsheet Row Office Visit from 08/02/2019 in [facility]  Total GAD-7 Score 9    PHQ2-9   Flowsheet Row [facility] Video Visit from 06/19/2020 in [facility] Video Visit from 06/13/2020 in [facility] Video Visit from 05/25/2020 in [facility] Video Visit from 05/16/2020 in [facility]  PHQ-2 Total Score 0 0 0 0 0  PHQ-9 Total Score -- 0 -- -- --    Flowsheet Row Video Visit from 07/26/2020 in [facility]  C-SSRS RISK CATEGORY No Risk       Assessment and Plan: This patient is a 40 year old female with a history of depression.  She continues to do very well on her current regimen.  She will continue Trintellix 30 mg daily as well as Effexor XR 300 mg daily both for depression.  She will return to see me in 4 months or call sooner as needed   Claire Ruder, MD 07/26/2020, 4:37 PM

## 2020-08-23 ENCOUNTER — Other Ambulatory Visit: Payer: Self-pay

## 2020-08-23 ENCOUNTER — Encounter: Payer: Self-pay | Admitting: Family Medicine

## 2020-08-23 ENCOUNTER — Ambulatory Visit: Payer: BC Managed Care – PPO | Admitting: Family Medicine

## 2020-08-23 VITALS — BP 131/74 | HR 74 | Temp 97.3°F | Ht 63.0 in | Wt 354.0 lb

## 2020-08-23 DIAGNOSIS — E559 Vitamin D deficiency, unspecified: Secondary | ICD-10-CM

## 2020-08-23 DIAGNOSIS — R7301 Impaired fasting glucose: Secondary | ICD-10-CM

## 2020-08-23 DIAGNOSIS — M25551 Pain in right hip: Secondary | ICD-10-CM | POA: Diagnosis not present

## 2020-08-23 DIAGNOSIS — Z800 Family history of malignant neoplasm of digestive organs: Secondary | ICD-10-CM

## 2020-08-23 DIAGNOSIS — F322 Major depressive disorder, single episode, severe without psychotic features: Secondary | ICD-10-CM

## 2020-08-23 DIAGNOSIS — Z1159 Encounter for screening for other viral diseases: Secondary | ICD-10-CM

## 2020-08-23 DIAGNOSIS — E785 Hyperlipidemia, unspecified: Secondary | ICD-10-CM

## 2020-08-23 DIAGNOSIS — Z1329 Encounter for screening for other suspected endocrine disorder: Secondary | ICD-10-CM

## 2020-08-23 DIAGNOSIS — G8929 Other chronic pain: Secondary | ICD-10-CM

## 2020-08-23 NOTE — Assessment & Plan Note (Signed)
PHQ 9 score of 15, decompensated since brother's death, refer therapy and notify Psych

## 2020-08-23 NOTE — Assessment & Plan Note (Signed)
2 month h/o right hip pain  With instability

## 2020-08-23 NOTE — Patient Instructions (Signed)
F/U in 12 weeks, call if you need e sooner  Labs today, hep C screen, cBC, lipid, cmp and eGFr, tSH, hBA1c CBC and vit D   You are referred to Orthopedics re hip pain  You are referred to therapist, and I will let dr Harrington Challenger know your score  It is important that you exercise regularly at least 30 minutes 5 times a week. If you develop chest pain, have severe difficulty breathing, or feel very tired, stop exercising immediately and seek medical attention   Think about what you will eat, plan ahead. Choose " clean, green, fresh or frozen" over canned, processed or packaged foods which are more sugary, salty and fatty. 70 to 75% of food eaten should be vegetables and fruit. Three meals at set times with snacks allowed between meals, but they must be fruit or vegetables. Aim to eat over a 12 hour period , example 7 am to 7 pm, and STOP after  your last meal of the day. Drink water,generally about 64 ounces per day, no other drink is as healthy. Fruit juice is best enjoyed in a healthy way, by EATING the fruit.  Thanks for choosing [facility], we consider it a privelige to serve you.

## 2020-08-24 ENCOUNTER — Other Ambulatory Visit: Payer: Self-pay | Admitting: Family Medicine

## 2020-08-24 LAB — CMP14+EGFR
ALT: 9 IU/L (ref 0–32)
AST: 8 IU/L (ref 0–40)
Albumin/Globulin Ratio: 2 (ref 1.2–2.2)
Albumin: 4.3 g/dL (ref 3.8–4.8)
Alkaline Phosphatase: 103 IU/L (ref 44–121)
BUN/Creatinine Ratio: 20 (ref 9–23)
BUN: 17 mg/dL (ref 6–20)
Bilirubin Total: <0.2 mg/dL (ref 0.0–1.2)
CO2: 23 mmol/L (ref 20–29)
Calcium: 9.2 mg/dL (ref 8.7–10.2)
Chloride: 102 mmol/L (ref 96–106)
Creatinine, Ser: 0.85 mg/dL (ref 0.57–1.00)
Globulin, Total: 2.2 g/dL (ref 1.5–4.5)
Glucose: 75 mg/dL (ref 65–99)
Potassium: 4.7 mmol/L (ref 3.5–5.2)
Sodium: 141 mmol/L (ref 134–144)
Total Protein: 6.5 g/dL (ref 6.0–8.5)
eGFR: 89 mL/min/{1.73_m2} (ref >59)

## 2020-08-24 LAB — CBC WITH DIFFERENTIAL
Basophils Absolute: 0 10*3/uL (ref 0.0–0.2)
Basos: 0 %
EOS (ABSOLUTE): 0.3 10*3/uL (ref 0.0–0.4)
Eos: 4 %
Hematocrit: 35.1 % (ref 34.0–46.6)
Hemoglobin: 11.8 g/dL (ref 11.1–15.9)
Immature Grans (Abs): 0.1 10*3/uL (ref 0.0–0.1)
Immature Granulocytes: 1 %
Lymphocytes Absolute: 2.2 10*3/uL (ref 0.7–3.1)
Lymphs: 26 %
MCH: 29.1 pg (ref 26.6–33.0)
MCHC: 33.6 g/dL (ref 31.5–35.7)
MCV: 87 fL (ref 79–97)
Monocytes Absolute: 0.6 10*3/uL (ref 0.1–0.9)
Monocytes: 7 %
Neutrophils Absolute: 5.2 10*3/uL (ref 1.4–7.0)
Neutrophils: 62 %
RBC: 4.05 x10E6/uL (ref 3.77–5.28)
RDW: 13.8 % (ref 11.7–15.4)
WBC: 8.4 10*3/uL (ref 3.4–10.8)

## 2020-08-24 LAB — TSH: TSH: 2.38 u[IU]/mL (ref 0.450–4.500)

## 2020-08-24 LAB — LIPID PANEL
Chol/HDL Ratio: 3.7 ratio (ref 0.0–4.4)
Cholesterol, Total: 220 mg/dL — ABNORMAL HIGH (ref 100–199)
HDL: 60 mg/dL (ref >39)
LDL Chol Calc (NIH): 135 mg/dL — ABNORMAL HIGH (ref 0–99)
Triglycerides: 143 mg/dL (ref 0–149)
VLDL Cholesterol Cal: 25 mg/dL (ref 5–40)

## 2020-08-24 LAB — HEMOGLOBIN A1C
Est. average glucose Bld gHb Est-mCnc: 117 mg/dL
Hgb A1c MFr Bld: 5.7 % — ABNORMAL HIGH (ref 4.8–5.6)

## 2020-08-24 LAB — HEPATITIS C ANTIBODY: Hep C Virus Ab: 0.2 s/co ratio (ref 0.0–0.9)

## 2020-08-24 LAB — VITAMIN D 25 HYDROXY (VIT D DEFICIENCY, FRACTURES): Vit D, 25-Hydroxy: 37.8 ng/mL (ref 30.0–100.0)

## 2020-08-28 ENCOUNTER — Encounter: Payer: Self-pay | Admitting: Family Medicine

## 2020-08-28 NOTE — Progress Notes (Signed)
   Claire [name]     MRN: [id number]      DOB: 1981/03/04   HPI Claire [name] is here for follow up and re-evaluation of chronic medical conditions, medication management and review of any available recent lab and radiology data.  Preventive health is updated, specifically  Cancer screening and Immunization.   Questions or concerns regarding consultations or procedures which the PT has had in the interim are  addressed. The PT denies any adverse reactions to current medications since the last visit.  1 month h/o right hip pain, no inciting trauma, difficult to weight bear for extended periods C/o uncontrolled depression ever since her brother passed un expectedly. Not suicidal or homicidal, is interested in therapy  ROS Denies recent fever or chills. Denies sinus pressure, nasal congestion, ear pain or sore throat. Denies chest congestion, productive cough or wheezing. Denies chest pains, palpitations and leg swelling Denies abdominal pain, nausea, vomiting,diarrhea or constipation.   Denies dysuria, frequency, hesitancy or incontinence.  Denies headaches, seizures, numbness, or tingling. Denies skin break down or rash.   PE  BP 131/74 (BP Location: Right Arm, Patient Position: Sitting, Cuff Size: Large)   Pulse 74   Temp (!) 97.3 F (36.3 C) (Temporal)   Ht 5\' 3"  (1.6 m)   Wt (!) 354 lb (160.6 kg)   LMP 07/31/2020   SpO2 96%   BMI 62.71 kg/m   Patient alert and oriented and in no cardiopulmonary distress.  HEENT: No facial asymmetry, EOMI,     Neck supple .  Chest: Clear to auscultation bilaterally.  CVS: S1, S2 no murmurs, no S3.Regular rate.  ABD: Soft non tender.   Ext: No edema  MS: Adequate ROM spine, shoulders,  and knees.  Skin: Intact, no ulcerations or rash noted.  Psych: Good eye contact, flat l affect. Memory intact  depressed appearing.  CNS: CN 2-12 intact, power,  normal throughout.no focal deficits noted.   Assessment & Plan  Chronic hip pain,  right 2 month h/o right hip pain  With instability  Depression, major, single episode, severe (HCC) PHQ 9 score of 15, decompensated since brother's death, refer therapy and notify Psych  Dyslipidemia, goal LDL below 100 Hyperlipidemia:Low fat diet discussed and encouraged.   Lipid Panel  Lab Results  Component Value Date   CHOL 220 (H) 08/23/2020   HDL 60 08/23/2020   LDLCALC 135 (H) 08/23/2020   TRIG 143 08/23/2020   CHOLHDL 3.7 08/23/2020     Needs to reduce fat  diet  Morbid obesity  Patient re-educated about  the importance of commitment to a  minimum of 150 minutes of exercise per week as able.  The importance of healthy food choices with portion control discussed, as well as eating regularly and within a 12 hour window most days. The need to choose "clean , green" food 50 to 75% of the time is discussed, as well as to make water the primary drink and set a goal of 64 ounces water daily.    Weight /BMI 08/23/2020 05/25/2020 10/26/2019  WEIGHT 354 lb 338 lb 335 lb  HEIGHT 5\' 3"  5\' 3"  5\' 3"   BMI 62.71 kg/m2 59.87 kg/m2 59.34 kg/m2  Some encounter information is confidential and restricted. Go to Review Flowsheets activity to see all data.      FH: colon cancer in relative <75 years old Refer to GI for consideration of screening colonoscopy based on family history

## 2020-08-28 NOTE — Assessment & Plan Note (Signed)
  Patient re-educated about  the importance of commitment to a  minimum of 150 minutes of exercise per week as able.  The importance of healthy food choices with portion control discussed, as well as eating regularly and within a 12 hour window most days. The need to choose "clean , green" food 50 to 75% of the time is discussed, as well as to make water the primary drink and set a goal of 64 ounces water daily.    Weight /BMI 08/23/2020 05/25/2020 10/26/2019  WEIGHT 354 lb 338 lb 335 lb  HEIGHT 5\' 3"  5\' 3"  5\' 3"   BMI 62.71 kg/m2 59.87 kg/m2 59.34 kg/m2  Some encounter information is confidential and restricted. Go to Review Flowsheets activity to see all data.

## 2020-08-28 NOTE — Assessment & Plan Note (Signed)
Hyperlipidemia:Low fat diet discussed and encouraged.   Lipid Panel  Lab Results  Component Value Date   CHOL 220 (H) 08/23/2020   HDL 60 08/23/2020   LDLCALC 135 (H) 08/23/2020   TRIG 143 08/23/2020   CHOLHDL 3.7 08/23/2020     Needs to reduce fat  diet

## 2020-08-28 NOTE — Assessment & Plan Note (Signed)
Refer to GI for consideration of screening colonoscopy based on family history

## 2020-08-30 ENCOUNTER — Other Ambulatory Visit: Payer: Self-pay

## 2020-08-30 ENCOUNTER — Telehealth: Payer: Self-pay | Admitting: Licensed Clinical Social Worker

## 2020-08-30 ENCOUNTER — Ambulatory Visit (INDEPENDENT_AMBULATORY_CARE_PROVIDER_SITE_OTHER): Payer: BC Managed Care – PPO | Admitting: Licensed Clinical Social Worker

## 2020-08-30 DIAGNOSIS — F322 Major depressive disorder, single episode, severe without psychotic features: Secondary | ICD-10-CM

## 2020-08-30 NOTE — Progress Notes (Signed)
Virtual behavioral Health Initiative (vBHI) Psychiatric Consultant Case Review   Claire [name] is a 40 y.o. year old female with a history of depression, hyperlipidemia. chronic pain. Worsening in depression in the setting of grief of loss of her brother from MVA in March 2021. She is a patient of Dr. Tenny Craw.    Recommendation - Psychotropics are managed by Dr. Tenny Craw - [facility] specialist to see her weekly; work on behavioral activation while validating grief  Thank you for your consult. We will continue to follow the patient. Please contact vBHI  for any questions or concerns.   The above treatment considerations and suggestions are based on consultation with the [facility] specialist and/or PCP and a review of information available in the shared registry and the patient's Electronic Health Record (EHR). I have not personally examined the patient. All recommendations should be implemented with consideration of the patient's relevant prior history and current clinical status. Please feel free to call me with any questions about the care of this patient.

## 2020-08-30 NOTE — BH Specialist Note (Signed)
[facility] Initial Clinical Assessment  MRN: [id number] NAME: Claire [name] Date: 08/30/20  Start time:   1245pEnd time:  115p Total time:  30 min Call number:  [phone]  Type of Contact:  Telephone Patient consent obtained:  yes Reason for Visit today:  VBH session  Treatment History Patient recently received Inpatient Treatment:    Facility/Program:    Date of discharge:   Patient currently being seen by therapist/psychiatrist:   Patient currently receiving the following services:    Past Psychiatric History/Hospitalization(s): Anxiety: No Bipolar Disorder: No Depression: No Mania: No Psychosis: No Schizophrenia: No Personality Disorder: No Hospitalization for psychiatric illness: No History of Electroconvulsive Shock Therapy: No Prior Suicide Attempts: No  Clinical Assessment:  PHQ-9 Assessments: Depression screen [facility] 2/9 08/23/2020 08/23/2020 07/26/2020  Decreased Interest 2 0 0  Down, Depressed, Hopeless 2 1 0  PHQ - 2 Score 4 1 0  Altered sleeping 2 - -  Tired, decreased energy 3 - -  Change in appetite 3 - -  Feeling bad or failure about yourself  1 - -  Trouble concentrating 2 - -  Moving slowly or fidgety/restless 0 - -  Suicidal thoughts 0 - -  PHQ-9 Score 15 - -  Difficult doing work/chores Extremely dIfficult - -  Some recent data might be hidden    GAD-7 Assessments: GAD 7 : Generalized Anxiety Score 08/02/2019  Nervous, Anxious, on Edge 2  Control/stop worrying 2  Worry too much - different things 2  Trouble relaxing 1  Restless 0  Easily annoyed or irritable 1  Afraid - awful might happen 1  Total GAD 7 Score 9     Social Functioning Social maturity:  WNL Social judgement:  WNL  Stress Current stressors:  working two jobs Familial stressors:  brother died about a year ago Sleep:  poor sleep Appetite:  "I grab what's available" Coping ability:  overwhelmed Patient taking medications as prescribed:  yes  Current  medications:  Outpatient Encounter Medications as of 08/30/2020  Medication Sig  . Glucosamine HCl (GLUCOSAMINE PO) Take by mouth daily.  Marland Kitchen ibuprofen (ADVIL) 800 MG tablet TAKE 1 TABLET BY MOUTH TWICE DAILY AS NEEDED FOR HEADACHE (MAXIMUM  USE  OF  TWICE  A  WEEK)  . loratadine (CLARITIN) 10 MG tablet TAKE ONE TABLET BY MOUTH ONCE DAILY  . Multiple Vitamin (MULTIVITAMIN) tablet Take 1 tablet by mouth daily.  . ondansetron (ZOFRAN ODT) 4 MG disintegrating tablet Take 1 tablet (4 mg total) by mouth every 8 (eight) hours as needed for nausea or vomiting.  . propranolol (INDERAL) 20 MG tablet Take 1 tablet by mouth twice daily  . rizatriptan (MAXALT-MLT) 10 MG disintegrating tablet Take 1 tablet (10 mg total) by mouth as needed for migraine. May repeat in 2 hours if needed  . TRI-SPRINTEC 0.18/0.215/0.25 MG-35 MCG tablet Take 1 tablet by mouth once daily  . venlafaxine XR (EFFEXOR-XR) 150 MG 24 hr capsule Take 2 capsules (300 mg total) by mouth daily with breakfast.  . vortioxetine HBr (TRINTELLIX) 10 MG TABS tablet Take 1 tablet (10 mg total) by mouth daily.  Marland Kitchen vortioxetine HBr (TRINTELLIX) 20 MG TABS tablet Take 1 tablet (20 mg total) by mouth daily.   No facility-administered encounter medications on file as of 08/30/2020.    Self-harm Behaviors Risk Assessment Self-harm risk factors:   Patient endorses recent thoughts of harming self:    [place] Suicide Severity Rating Scale:  C-SRSS 07/26/2020  1. Wish to  be Dead No  2. Suicidal Thoughts No  6. Suicide Behavior Question No    Danger to Others Risk Assessment Danger to others risk factors:   Patient endorses recent thoughts of harming others:    Dynamic Appraisal of Situational Aggression (DASA): No flowsheet data found.  Substance Use Assessment Patient recently consumed alcohol:    Alcohol Use Disorder Identification Test (AUDIT):  Alcohol Use Disorder Test (AUDIT) 08/02/2019  1. How often do you have a drink containing alcohol? 1   2. How many drinks containing alcohol do you have on a typical day when you are drinking? 0  3. How often do you have six or more drinks on one occasion? 1  AUDIT-C Score 2   Patient recently used drugs:    Opioid Risk Assessment:  Patient is concerned about dependence or abuse of substances:    ASAM Multidimensional Assessment Summary:  Dimension 1:    Dimension 1 Rating:    Dimension 2:    Dimension 2 Rating:    Dimension 3:    Dimension 3 Rating:    Dimension 4:    Dimension 4 Rating:    Dimension 5:    Dimension 5 Rating:    Dimension 6:    Dimension 6 Rating:   ASAM's Severity Rating Score:   ASAM Recommended Level of Treatment:     Goals, Interventions and Follow-up Plan Goals: Begin healthy grieving over loss Interventions: Mindfulness or Relaxation Training Follow-up Plan: Biweekly VBH sessions  Summary of Clinical Assessment Summary: Claire [name] is a 40 yr old woman referred by her PCP, Dr. Lodema Hong.  Patient reports that her brother died in a car accident 07/23/2019.  She continues to struggle with his death.  She and her family honor his memory as often as they can (birthday & anniversary). She has poor appetite, "eating whatever I can grab" and reports poor sleep pattern.  She denies any enjoyable time due to working 2 jobs.  She is a Editor, commissioning at a middle school.  Her daily routine is working 2 jobs and going home to sleep.  She just began medication prescribed by Dr. Lodema Hong.  She is interested in [facility] services; biweekly sessions.   Claire Elk, LCSW

## 2020-08-30 NOTE — BH Specialist Note (Signed)
Virtual Behavioral Health Treatment Plan Team Note  MRN: [id number] NAME: Claire [name]  DATE: 08/30/20  Start time:  450p End time:  455p Total time:  5 min  Total number of Virtual BH Treatment Team Plan encounters: 1/4  Treatment Team Attendees: Nolon Rod, LCSW; Dr. Vanetta Shawl, Psychiatrist  Diagnoses: No diagnosis found.  Goals, Interventions and Follow-up Plan Goals: Begin healthy grieving over loss Interventions: Mindfulness or Relaxation Training Medication Management Recommendations: n/a; she sees Dr. Tenny Craw Follow-up Plan: Biweekly VBH sessions  History of the present illness Presenting Problem/Current Symptoms: bereavement  Psychiatric History  Depression: No Anxiety: No Mania: No Psychosis: No PTSD symptoms: No  Past Psychiatric History/Hospitalization(s): Hospitalization for psychiatric illness: No Prior Suicide Attempts: No Prior Self-injurious behavior: No   Self-harm Behaviors Risk Assessment   Screenings PHQ-9 Assessments:  Depression screen [facility] 2/9 08/23/2020 08/23/2020 07/26/2020  Decreased Interest 2 0 0  Down, Depressed, Hopeless 2 1 0  PHQ - 2 Score 4 1 0  Altered sleeping 2 - -  Tired, decreased energy 3 - -  Change in appetite 3 - -  Feeling bad or failure about yourself  1 - -  Trouble concentrating 2 - -  Moving slowly or fidgety/restless 0 - -  Suicidal thoughts 0 - -  PHQ-9 Score 15 - -  Difficult doing work/chores Extremely dIfficult - -  Some recent data might be hidden   GAD-7 Assessments:  GAD 7 : Generalized Anxiety Score 08/02/2019  Nervous, Anxious, on Edge 2  Control/stop worrying 2  Worry too much - different things 2  Trouble relaxing 1  Restless 0  Easily annoyed or irritable 1  Afraid - awful might happen 1  Total GAD 7 Score 9    Past Medical History Past Medical History:  Diagnosis Date  . Acne    facial  . Arthritis   . Depression    Phreesia 05/24/2020  . Migraines   . Nicotine addiction   . Obesity      Vital signs: There were no vitals filed for this visit.  Allergies:  Allergies as of 08/30/2020  . (No Known Allergies)    Medication History Current medications:  Outpatient Encounter Medications as of 08/30/2020  Medication Sig  . Glucosamine HCl (GLUCOSAMINE PO) Take by mouth daily.  Marland Kitchen ibuprofen (ADVIL) 800 MG tablet TAKE 1 TABLET BY MOUTH TWICE DAILY AS NEEDED FOR HEADACHE (MAXIMUM  USE  OF  TWICE  A  WEEK)  . loratadine (CLARITIN) 10 MG tablet TAKE ONE TABLET BY MOUTH ONCE DAILY  . Multiple Vitamin (MULTIVITAMIN) tablet Take 1 tablet by mouth daily.  . ondansetron (ZOFRAN ODT) 4 MG disintegrating tablet Take 1 tablet (4 mg total) by mouth every 8 (eight) hours as needed for nausea or vomiting.  . propranolol (INDERAL) 20 MG tablet Take 1 tablet by mouth twice daily  . rizatriptan (MAXALT-MLT) 10 MG disintegrating tablet Take 1 tablet (10 mg total) by mouth as needed for migraine. May repeat in 2 hours if needed  . TRI-SPRINTEC 0.18/0.215/0.25 MG-35 MCG tablet Take 1 tablet by mouth once daily  . venlafaxine XR (EFFEXOR-XR) 150 MG 24 hr capsule Take 2 capsules (300 mg total) by mouth daily with breakfast.  . vortioxetine HBr (TRINTELLIX) 10 MG TABS tablet Take 1 tablet (10 mg total) by mouth daily.  Marland Kitchen vortioxetine HBr (TRINTELLIX) 20 MG TABS tablet Take 1 tablet (20 mg total) by mouth daily.   No facility-administered encounter medications on file as of 08/30/2020.  Scribe for Treatment Team: Lubertha South, LCSW

## 2020-09-05 ENCOUNTER — Ambulatory Visit: Payer: BC Managed Care – PPO

## 2020-09-05 ENCOUNTER — Other Ambulatory Visit: Payer: Self-pay

## 2020-09-05 ENCOUNTER — Encounter: Payer: Self-pay | Admitting: Orthopedic Surgery

## 2020-09-05 ENCOUNTER — Ambulatory Visit: Payer: BC Managed Care – PPO | Admitting: Orthopedic Surgery

## 2020-09-05 VITALS — Ht 63.0 in | Wt 356.0 lb

## 2020-09-05 DIAGNOSIS — M25551 Pain in right hip: Secondary | ICD-10-CM | POA: Diagnosis not present

## 2020-09-05 DIAGNOSIS — M7061 Trochanteric bursitis, right hip: Secondary | ICD-10-CM

## 2020-09-05 DIAGNOSIS — Z6841 Body Mass Index (BMI) 40.0 and over, adult: Secondary | ICD-10-CM | POA: Diagnosis not present

## 2020-09-05 NOTE — Patient Instructions (Signed)
Voltaren gel - available over the counter.  Let me know if you would prefer I write a prescription  Ibuprofen - consider taking it consistently for 10-14 days, regardless of how it feels, in order to improve the inflammation.    Follow up as needed.

## 2020-09-05 NOTE — Progress Notes (Signed)
New Patient Visit  Assessment: Claire [name] is a 40 y.o. female with the following: Right greater trochanteric bursitis.  Plan: [name] has right lateral hip pain of insidious onset.  Pain radiates to the front of her hip, as well as down her thigh.  She notes a popping sensation.  She could potentially have external snapping of the hip, or perhaps some irritation of the psoas tendon.  However, on physical exam, the pain is recreated with resisted abduction, and not with resisted flexion.  Due to her body habitus, it is difficult to palpate the greater trochanter, but this is the area where her pain is the greatest.  As a result, we will plan to treat this as bursitis.  Continue to take ibuprofen as needed, but recommend taking consistently for 10-14 days, and then PRN.  Also consider voltaren gel, available over the counter.  We placed a referral to PT.  Follow up as needed.  The patient meets the AMA guidelines for Morbid obesity with BMI > 40.  The patient has been counseled on weight loss.     Follow-up: Return if symptoms worsen or fail to improve.  Subjective:  Chief Complaint  Patient presents with  . Hip Pain    Rt side hip pain for a few week. Pt states she was cleaning her floor 2 months ago and slipped into a split like position but did not have any immediate pain. Also states she can hear a clicking in her hip when she goes up stairs.     History of Present Illness: Claire [name] is a 40 y.o. female who has been referred to clinic today by Syliva Overman, MD for evaluation of right hip pain.  She states she slipped approximately 2 months ago, and fell into a "splits" position.  She did not note pain at that time, but over the next 2 weeks, she started have pain in her lateral right hip and into her groin.  She also notes a clicking sensation in her hip, especially when she goes up stairs.  Pain is worse at night.  Pain radiates into her groin and distally in her lateral  thigh.  She has been taking ibuprofen for pain, with some improvement. No physical therapy.     Review of Systems: No fevers or chills No numbness or tingling No chest pain No shortness of breath No bowel or bladder dysfunction No GI distress No headaches   Medical History:  Past Medical History:  Diagnosis Date  . Acne    facial  . Arthritis   . Depression    Phreesia 05/24/2020  . Migraines   . Nicotine addiction   . Obesity     Past Surgical History:  Procedure Laterality Date  . CHOLECYSTECTOMY    . COLONOSCOPY  10/2009   Dr. Jena Gauss; minimal anal canal hemorrhoids, otherwise normal rectum and colon.  Recommended repeat at age 56 due to family history.    Family History  Problem Relation Age of Onset  . Hypertension Mother   . Cancer Mother 51       colon  . Obesity Mother   . Hypertension Father   . Diabetes Father   . Hyperlipidemia Father   . Obesity Father    Social History   Tobacco Use  . Smoking status: Former Smoker    Quit date: 11/13/2008    Years since quitting: 11.8  . Smokeless tobacco: Never Used  . Tobacco comment: Quit 2008, 04-02-2016 per pt  stopped 2010  Vaping Use  . Vaping Use: Never used  Substance Use Topics  . Alcohol use: No    Alcohol/week: 0.0 standard drinks    Comment: occasional use  a beer once every 3 months or so, 04-02-2016 per pt occas.   . Drug use: No    Comment: 04-02-2016 per pt no    No Known Allergies  Current Meds  Medication Sig  . Glucosamine HCl (GLUCOSAMINE PO) Take by mouth daily.  Marland Kitchen ibuprofen (ADVIL) 800 MG tablet TAKE 1 TABLET BY MOUTH TWICE DAILY AS NEEDED FOR HEADACHE (MAXIMUM  USE  OF  TWICE  A  WEEK)  . loratadine (CLARITIN) 10 MG tablet TAKE ONE TABLET BY MOUTH ONCE DAILY  . Multiple Vitamin (MULTIVITAMIN) tablet Take 1 tablet by mouth daily.  . ondansetron (ZOFRAN ODT) 4 MG disintegrating tablet Take 1 tablet (4 mg total) by mouth every 8 (eight) hours as needed for nausea or vomiting.  .  propranolol (INDERAL) 20 MG tablet Take 1 tablet by mouth twice daily  . rizatriptan (MAXALT-MLT) 10 MG disintegrating tablet Take 1 tablet (10 mg total) by mouth as needed for migraine. May repeat in 2 hours if needed  . TRI-SPRINTEC 0.18/0.215/0.25 MG-35 MCG tablet Take 1 tablet by mouth once daily  . venlafaxine XR (EFFEXOR-XR) 150 MG 24 hr capsule Take 2 capsules (300 mg total) by mouth daily with breakfast.  . vortioxetine HBr (TRINTELLIX) 10 MG TABS tablet Take 1 tablet (10 mg total) by mouth daily.  Marland Kitchen vortioxetine HBr (TRINTELLIX) 20 MG TABS tablet Take 1 tablet (20 mg total) by mouth daily.    Objective: Ht 5\' 3"  (1.6 m)   Wt (!) 356 lb (161.5 kg)   LMP 08/28/2020 (Exact Date)   BMI 63.06 kg/m   Physical Exam:  General:  Obese female, no acute distress. Alert and oriented.  Gait:  Normal  Evaluation of the right hip demonstrates no deformity.  Difficult to palpate the greater trochanter, but she localizes the pain to this area.  No discomfort with flexion to 90, ER to 45 and IR to 30.  Pain with resisted abduction and pain radiating distally.  No pain with resisted hip flexion.     IMAGING: I personally ordered and reviewed the following images   XR of the right hip without acute injury.  Minimal degenerative changes of the right hip.  Well maintained joint space.  No evidence of decreased head-neck offset.  Impression: normal right hip XR   New Medications:  No orders of the defined types were placed in this encounter.     10/28/2020, MD  09/05/2020 10:48 PM  \

## 2020-09-21 ENCOUNTER — Ambulatory Visit (HOSPITAL_COMMUNITY): Payer: BC Managed Care – PPO | Admitting: Physical Therapy

## 2020-09-28 ENCOUNTER — Other Ambulatory Visit: Payer: Self-pay | Admitting: Family Medicine

## 2020-09-28 ENCOUNTER — Encounter: Payer: Self-pay | Admitting: Family Medicine

## 2020-10-03 ENCOUNTER — Encounter: Payer: Self-pay | Admitting: Family Medicine

## 2020-10-05 ENCOUNTER — Encounter: Payer: Self-pay | Admitting: Family Medicine

## 2020-10-05 ENCOUNTER — Telehealth (INDEPENDENT_AMBULATORY_CARE_PROVIDER_SITE_OTHER): Payer: BC Managed Care – PPO | Admitting: Family Medicine

## 2020-10-05 ENCOUNTER — Other Ambulatory Visit: Payer: Self-pay

## 2020-10-05 DIAGNOSIS — G8929 Other chronic pain: Secondary | ICD-10-CM

## 2020-10-05 DIAGNOSIS — M5442 Lumbago with sciatica, left side: Secondary | ICD-10-CM

## 2020-10-05 MED ORDER — PANTOPRAZOLE SODIUM 40 MG PO TBEC: 40.0000 mg | DELAYED_RELEASE_TABLET | Freq: Every day | ORAL | 0 refills | Status: DC

## 2020-10-05 MED ORDER — PREDNISONE 5 MG (21) PO TBPK: 5.0000 mg | ORAL_TABLET | ORAL | 0 refills | Status: DC

## 2020-10-05 MED ORDER — IBUPROFEN 800 MG PO TABS: 800.0000 mg | ORAL_TABLET | Freq: Three times a day (TID) | ORAL | 0 refills | Status: DC

## 2020-10-05 NOTE — Progress Notes (Signed)
Virtual Visit via Telephone Note  I connected with Claire [name] on 10/05/20 at  1:20 PM EDT by telephone and verified that I am speaking with the correct person using two identifiers.  Location: Patient: work Provider: office   I discussed the limitations, risks, security and privacy concerns of performing an evaluation and management service by telephone and the availability of in person appointments. I also discussed with the patient that there may be a patient responsible charge related to this service. The patient expressed understanding and agreed to proceed.   History of Present Illness:   10 week h/o LBP radiating to both groins right worse than left daily , rated at between 2 to 8 Saw Ortho for presumed hip pain and this was not the prblem hip X ray NORMAL Denies any new lower extremity weakness, numbness or incontinence Observations/Objective: There were no vitals taken for this visit. Good communication with no confusion and intact memory. Alert and oriented x 3 No signs of respiratory distress during speech   Assessment and Plan:  Low back pain with left-sided sciatica Uncontrolled, increased and debilitating Pednisone dose pack with ibuprofen and PPI, needs X ray lumbar spine today  Follow Up Instructions:    I discussed the assessment and treatment plan with the patient. The patient was provided an opportunity to ask questions and all were answered. The patient agreed with the plan and demonstrated an understanding of the instructions.   The patient was advised to call back or seek an in-person evaluation if the symptoms worsen or if the condition fails to improve as anticipated.  I provided 8 minutes of non-face-to-face time during this encounter.   Syliva Overman, MD

## 2020-10-05 NOTE — Patient Instructions (Addendum)
F/U in July as before, call if you need me sooner  Claire [name] are treated for back pain, ibuprofen, prednisone and protonix are prescribed, please take as directed  Please get X ray of your back today  Hope you feel better soon  Thanks for choosing [facility], we consider it a privelige to serve you.

## 2020-10-07 DIAGNOSIS — M5442 Lumbago with sciatica, left side: Secondary | ICD-10-CM | POA: Insufficient documentation

## 2020-10-07 NOTE — Assessment & Plan Note (Signed)
Uncontrolled, increased and debilitating Pednisone dose pack with ibuprofen and PPI, needs X ray lumbar spine today

## 2020-11-03 ENCOUNTER — Ambulatory Visit (HOSPITAL_COMMUNITY): Payer: BC Managed Care – PPO

## 2020-11-03 ENCOUNTER — Telehealth (HOSPITAL_COMMUNITY): Payer: Self-pay

## 2020-11-03 NOTE — Telephone Encounter (Signed)
Pt l/m she has to work today and will not be here- she will call back to r/s

## 2020-11-20 ENCOUNTER — Encounter: Payer: Self-pay | Admitting: Family Medicine

## 2020-11-20 ENCOUNTER — Other Ambulatory Visit: Payer: Self-pay | Admitting: Family Medicine

## 2020-11-21 ENCOUNTER — Other Ambulatory Visit: Payer: Self-pay | Admitting: Family Medicine

## 2020-11-21 MED ORDER — RIZATRIPTAN BENZOATE 10 MG PO TBDP: 10.0000 mg | ORAL_TABLET | ORAL | 2 refills | Status: DC | PRN

## 2020-11-23 ENCOUNTER — Ambulatory Visit: Payer: Self-pay | Admitting: Family Medicine

## 2020-12-20 ENCOUNTER — Ambulatory Visit: Payer: Self-pay | Admitting: Gastroenterology

## 2020-12-28 ENCOUNTER — Other Ambulatory Visit: Payer: Self-pay | Admitting: Family Medicine

## 2020-12-28 ENCOUNTER — Other Ambulatory Visit (HOSPITAL_COMMUNITY): Payer: Self-pay | Admitting: Psychiatry

## 2020-12-29 NOTE — Telephone Encounter (Signed)
Call for appt

## 2020-12-31 ENCOUNTER — Encounter: Payer: Self-pay | Admitting: Family Medicine

## 2021-01-23 ENCOUNTER — Other Ambulatory Visit (HOSPITAL_COMMUNITY): Payer: Self-pay | Admitting: Psychiatry

## 2021-01-23 ENCOUNTER — Other Ambulatory Visit: Payer: Self-pay | Admitting: Family Medicine

## 2021-01-31 ENCOUNTER — Other Ambulatory Visit (HOSPITAL_COMMUNITY): Payer: Self-pay | Admitting: Psychiatry

## 2021-01-31 ENCOUNTER — Other Ambulatory Visit: Payer: Self-pay | Admitting: Family Medicine

## 2021-02-21 ENCOUNTER — Ambulatory Visit: Payer: BC Managed Care – PPO

## 2021-02-21 ENCOUNTER — Other Ambulatory Visit (HOSPITAL_COMMUNITY): Payer: Self-pay | Admitting: Family Medicine

## 2021-02-21 ENCOUNTER — Encounter: Payer: Self-pay | Admitting: Family Medicine

## 2021-02-21 ENCOUNTER — Other Ambulatory Visit (INDEPENDENT_AMBULATORY_CARE_PROVIDER_SITE_OTHER): Payer: BC Managed Care – PPO

## 2021-02-21 ENCOUNTER — Other Ambulatory Visit: Payer: Self-pay

## 2021-02-21 ENCOUNTER — Ambulatory Visit: Payer: BC Managed Care – PPO | Admitting: Family Medicine

## 2021-02-21 DIAGNOSIS — J312 Chronic pharyngitis: Secondary | ICD-10-CM | POA: Diagnosis not present

## 2021-02-21 DIAGNOSIS — J029 Acute pharyngitis, unspecified: Secondary | ICD-10-CM | POA: Diagnosis not present

## 2021-02-21 DIAGNOSIS — Z1231 Encounter for screening mammogram for malignant neoplasm of breast: Secondary | ICD-10-CM

## 2021-02-21 LAB — POCT RAPID STREP A (OFFICE): Rapid Strep A Screen: NEGATIVE

## 2021-02-21 NOTE — Patient Instructions (Signed)
Annual exam in office with MD in 4 to 10 weeks.  Call if you need me sooner.  Please schedule mammogram at checkout , Nov 4 or after     Fasting lipid Chem-7 and EGFR and HbA1c 1 week before next appointment.  Patient to come this afternoon around 4  for  rapid strep test as well as a temperature check.  Symptomatic treatment for sore throat only at this time which is Tylenol 325 mg 1 4 times daily as needed for pain or fever.  Salt water gargles 2-3 times daily.  It is important that you exercise regularly at least 30 minutes 5 times a week. If you develop chest pain, have severe difficulty breathing, or feel very tired, stop exercising immediately and seek medical attention  Think about what you will eat, plan ahead. Choose " clean, green, fresh or frozen" over canned, processed or packaged foods which are more sugary, salty and fatty. 70 to 75% of food eaten should be vegetables and fruit. Three meals at set times with snacks allowed between meals, but they must be fruit or vegetables. Aim to eat over a 12 hour period , example 7 am to 7 pm, and STOP after  your last meal of the day. Drink water,generally about 64 ounces per day, no other drink is as healthy. Fruit juice is best enjoyed in a healthy way, by EATING the fruit. Thanks for choosing [facility], we consider it a privelige to serve you.

## 2021-02-21 NOTE — Telephone Encounter (Signed)
WORK IN --appt today

## 2021-02-21 NOTE — Assessment & Plan Note (Signed)
rapid strep test and temperature check this afternoon.  Symptomatic treatment only at this time.

## 2021-02-21 NOTE — Progress Notes (Signed)
Virtual Visit via Telephone Note  I connected with OLETHA TOLSON on 02/21/21 at  1:00 PM EDT by telephone and verified that I am speaking with the correct person using two identifiers.  Location: Patient: work Provider: office   I discussed the limitations, risks, security and privacy concerns of performing an evaluation and management service by telephone and the availability of in person appointments. I also discussed with the patient that there may be a patient responsible charge related to this service. The patient expressed understanding and agreed to proceed.   History of Present Illness: 1 week ago, scratchy throat and sinus drainage, ;loss of voice last Tuesday through Sunday Yesterday and today, again sore thraoa, painful swallowing , today on the job, felt hot, spitting up green sputum last week , no nasal drainage currently   Observations/Objective: There were no vitals taken for this visit. Good communication with no confusion and intact memory. Alert and oriented x 3 No signs of respiratory distress during speech   Assessment and Plan: Sore throat  rapid strep test and temperature check this afternoon.  Symptomatic treatment only at this time.   Follow Up Instructions:    I discussed the assessment and treatment plan with the patient. The patient was provided an opportunity to ask questions and all were answered. The patient agreed with the plan and demonstrated an understanding of the instructions.   The patient was advised to call back or seek an in-person evaluation if the symptoms worsen or if the condition fails to improve as anticipated.  I provided 8 minutes of non-face-to-face time during this encounter.   Syliva Overman, MD

## 2021-03-07 ENCOUNTER — Other Ambulatory Visit (HOSPITAL_COMMUNITY): Payer: Self-pay | Admitting: Psychiatry

## 2021-03-07 ENCOUNTER — Other Ambulatory Visit: Payer: Self-pay | Admitting: Family Medicine

## 2021-03-07 DIAGNOSIS — F321 Major depressive disorder, single episode, moderate: Secondary | ICD-10-CM

## 2021-03-07 NOTE — Telephone Encounter (Signed)
Call for appt

## 2021-04-02 ENCOUNTER — Encounter: Payer: Self-pay | Admitting: Internal Medicine

## 2021-04-02 ENCOUNTER — Other Ambulatory Visit: Payer: Self-pay

## 2021-04-02 ENCOUNTER — Ambulatory Visit: Payer: BC Managed Care – PPO | Admitting: Internal Medicine

## 2021-04-02 DIAGNOSIS — J0100 Acute maxillary sinusitis, unspecified: Secondary | ICD-10-CM | POA: Diagnosis not present

## 2021-04-02 MED ORDER — AMOXICILLIN-POT CLAVULANATE 875-125 MG PO TABS
1.0000 | ORAL_TABLET | Freq: Two times a day (BID) | ORAL | 0 refills | Status: DC
Start: 2021-04-02 — End: 2021-04-17

## 2021-04-02 NOTE — Progress Notes (Signed)
Virtual Visit via Telephone Note   This visit type was conducted due to national recommendations for restrictions regarding the COVID-19 Pandemic (e.g. social distancing) in an effort to limit this patient's exposure and mitigate transmission in our community.  Due to her co-morbid illnesses, this patient is at least at moderate risk for complications without adequate follow up.  This format is felt to be most appropriate for this patient at this time.  The patient did not have access to video technology/had technical difficulties with video requiring transitioning to audio format only (telephone).  All issues noted in this document were discussed and addressed.  No physical exam could be performed with this format.  Evaluation Performed:  Follow-up visit  Date:  04/02/2021   ID:  Claire [name], DOB 1980-08-19, MRN [id number]  Patient Location: Home Provider Location: Office/Clinic  Participants: Patient Location of Patient: Home Location of Provider: Telehealth Consent was obtain for visit to be over via telehealth. I verified that I am speaking with the correct person using two identifiers.  PCP:  Claire Perches, MD   Chief Complaint: Cough, fatigue, sore throat and right ear pain  History of Present Illness:    Claire [name] is a 40 y.o. female who has televisit for complaint of cough, fatigue, sore throat, right ear pain and nasal congestion for the last 4 days.  She tested negative for COVID at home.  She denies any dyspnea or wheezing currently.  She also reports sinus pressure related headache and right ear tenderness, but denies any discharge currently.  The patient does not have symptoms concerning for COVID-19 infection (fever, chills, cough, or new shortness of breath).   Past Medical, Surgical, Social History, Allergies, and Medications have been Reviewed.  Past Medical History:  Diagnosis Date   Acne    facial   Arthritis    Depression    Phreesia 05/24/2020    Migraines    Nicotine addiction    Obesity    Past Surgical History:  Procedure Laterality Date   CHOLECYSTECTOMY     COLONOSCOPY  10/2009   Dr. Jena Gauss; minimal anal canal hemorrhoids, otherwise normal rectum and colon.  Recommended repeat at age 6 due to family history.     Current Meds  Medication Sig   Glucosamine HCl (GLUCOSAMINE PO) Take by mouth daily.   ibuprofen (ADVIL) 800 MG tablet Take 1 tablet (800 mg total) by mouth 3 (three) times daily.   ibuprofen (ADVIL) 800 MG tablet TAKE 1 TABLET BY MOUTH TWICE DAILY AS NEEDED FOR HEADACHE. MAXIMUM USE OF TWICE A WEEK   loratadine (CLARITIN) 10 MG tablet TAKE ONE TABLET BY MOUTH ONCE DAILY   Multiple Vitamin (MULTIVITAMIN) tablet Take 1 tablet by mouth daily.   pantoprazole (PROTONIX) 40 MG tablet Take 1 tablet (40 mg total) by mouth daily.   propranolol (INDERAL) 20 MG tablet Take 1 tablet by mouth twice daily   rizatriptan (MAXALT-MLT) 10 MG disintegrating tablet Take 1 tablet (10 mg total) by mouth as needed for migraine. May repeat in 2 hours if needed   rizatriptan (MAXALT-MLT) 10 MG disintegrating tablet Take 1 tablet (10 mg total) by mouth as needed for migraine. May repeat in 2 hours if needed   TRI-SPRINTEC 0.18/0.215/0.25 MG-35 MCG tablet Take 1 tablet by mouth once daily   TRINTELLIX 10 MG TABS tablet Take 1 tablet by mouth once daily   TRINTELLIX 20 MG TABS tablet Take 1 tablet by mouth once daily  venlafaxine XR (EFFEXOR-XR) 150 MG 24 hr capsule TAKE 2 CAPSULES BY MOUTH ONCE DAILY WITH BREAKFAST     Allergies:   Patient has no known allergies.   ROS:   Please see the history of present illness.     All other systems reviewed and are negative.   Labs/Other Tests and Data Reviewed:    Recent Labs: 08/23/2020: ALT 9; BUN 17; Creatinine, Ser 0.85; Hemoglobin 11.8; Potassium 4.7; Sodium 141; TSH 2.380   Recent Lipid Panel Lab Results  Component Value Date/Time   CHOL 220 (H) 08/23/2020 03:19 PM   TRIG 143  08/23/2020 03:19 PM   HDL 60 08/23/2020 03:19 PM   CHOLHDL 3.7 08/23/2020 03:19 PM   CHOLHDL 3.8 11/30/2018 10:58 AM   LDLCALC 135 (H) 08/23/2020 03:19 PM   LDLCALC 124 (H) 11/30/2018 10:58 AM    Wt Readings from Last 3 Encounters:  09/05/20 (!) 356 lb (161.5 kg)  08/23/20 (!) 354 lb (160.6 kg)  05/25/20 (!) 338 lb (153.3 kg)     ASSESSMENT & PLAN:    Acute sinusitis Started Augmentin as she has persistent symptoms despite symptomatic treatment with Mucinex Home COVID test negative Continue Mucinex as needed for cough Nasal saline spray as needed for nasal congestion Advised to contact if persistent ear symptoms  Time:   Today, I have spent 9 minutes reviewing the chart, including problem list, medications, and with the patient with telehealth technology discussing the above problems.   Medication Adjustments/Labs and Tests Ordered: Current medicines are reviewed at length with the patient today.  Concerns regarding medicines are outlined above.   Tests Ordered: No orders of the defined types were placed in this encounter.   Medication Changes: No orders of the defined types were placed in this encounter.    Note: This dictation was prepared with Dragon dictation along with smaller phrase technology. Similar sounding words can be transcribed inadequately or may not be corrected upon review. Any transcriptional errors that result from this process are unintentional.      Disposition:  Follow up  Signed, Anabel Halon, MD  04/02/2021 2:14 PM     [facility]

## 2021-04-03 ENCOUNTER — Encounter: Payer: Self-pay | Admitting: Internal Medicine

## 2021-04-03 ENCOUNTER — Encounter: Payer: Self-pay | Admitting: *Deleted

## 2021-04-06 ENCOUNTER — Other Ambulatory Visit (HOSPITAL_COMMUNITY): Payer: Self-pay | Admitting: Psychiatry

## 2021-04-06 ENCOUNTER — Encounter: Payer: Self-pay | Admitting: Internal Medicine

## 2021-04-06 NOTE — Telephone Encounter (Signed)
Call for appt

## 2021-04-09 ENCOUNTER — Encounter: Payer: Self-pay | Admitting: Family Medicine

## 2021-04-17 ENCOUNTER — Telehealth: Payer: Self-pay

## 2021-04-17 ENCOUNTER — Other Ambulatory Visit: Payer: Self-pay

## 2021-04-17 ENCOUNTER — Ambulatory Visit (INDEPENDENT_AMBULATORY_CARE_PROVIDER_SITE_OTHER): Payer: BC Managed Care – PPO | Admitting: Family Medicine

## 2021-04-17 DIAGNOSIS — J209 Acute bronchitis, unspecified: Secondary | ICD-10-CM | POA: Diagnosis not present

## 2021-04-17 DIAGNOSIS — J0110 Acute frontal sinusitis, unspecified: Secondary | ICD-10-CM

## 2021-04-17 DIAGNOSIS — U071 COVID-19: Secondary | ICD-10-CM

## 2021-04-17 MED ORDER — PROMETHAZINE-DM 6.25-15 MG/5ML PO SYRP: ORAL_SOLUTION | ORAL | 0 refills | Status: DC

## 2021-04-17 MED ORDER — NIRMATRELVIR/RITONAVIR (PAXLOVID)TABLET: 3.0000 | ORAL_TABLET | Freq: Two times a day (BID) | ORAL | 0 refills | Status: AC

## 2021-04-17 MED ORDER — AZITHROMYCIN 250 MG PO TABS: ORAL_TABLET | ORAL | 0 refills | Status: AC

## 2021-04-17 MED ORDER — BENZONATATE 100 MG PO CAPS: 100.0000 mg | ORAL_CAPSULE | Freq: Two times a day (BID) | ORAL | 0 refills | Status: DC | PRN

## 2021-04-17 NOTE — Progress Notes (Signed)
Virtual Visit via Telephone Note  I connected with Claire [name] on 04/17/21 at 11:00 AM EST by telephone and verified that I am speaking with the correct person using two identifiers.  Location: Patient: home Provider: office   I discussed the limitations, risks, security and privacy concerns of performing an evaluation and management service by telephone and the availability of in person appointments. I also discussed with the patient that there may be a patient responsible charge related to this service. The patient expressed understanding and agreed to proceed.   History of Present Illness: 4 day h/o headache, head and chest congestion, generalized body aches, green nasal drainage and green sputum Positive covid home test   Observations/Objective: There were no vitals taken for this visit. Good communication with no confusion and intact memory. Alert and oriented x 3 No signs of respiratory distress during speech   Assessment and Plan:  Positive self-administered antigen test for COVID-19 paxlovid prescribed, advised to isolate x 1 week, stay hydrated and get adequate rest  Acute sinusitis Z pack prescribed  Acute bronchitis Z pack, tessalon perles and phenergan DM are prescribed  Follow Up Instructions:    I discussed the assessment and treatment plan with the patient. The patient was provided an opportunity to ask questions and all were answered. The patient agreed with the plan and demonstrated an understanding of the instructions.   The patient was advised to call back or seek an in-person evaluation if the symptoms worsen or if the condition fails to improve as anticipated.  I provided 12 minutes of non-face-to-face time during this encounter.   Syliva Overman, MD

## 2021-04-17 NOTE — Telephone Encounter (Signed)
Spoke with pt she will pickup her medications

## 2021-04-17 NOTE — Telephone Encounter (Signed)
Patient called needs to change her pharmacy where all meds were sent to [facility] [place] instead of [facility] in [place].   Please resend all medicine to [facility] in [place] that was sent in for today and for the future.

## 2021-04-17 NOTE — Patient Instructions (Addendum)
F/u as before, call if you need me sooner AZITHROMYCIN, DECONGESTANT AND COUGH SUPPRESSANT ARE PRESCRIBED  YOU NEED FLU AND COVID TESTING, NURSE WILL CALL TO ARRANGE ,since home test is positive for covid, paxlovid is prescribed, no need for additional testing  Please self isolate for the next 1 week, take medications as prescribed and keep well hydrated

## 2021-04-18 ENCOUNTER — Encounter: Payer: Self-pay | Admitting: Family Medicine

## 2021-04-18 DIAGNOSIS — U071 COVID-19: Secondary | ICD-10-CM | POA: Insufficient documentation

## 2021-04-18 DIAGNOSIS — J019 Acute sinusitis, unspecified: Secondary | ICD-10-CM | POA: Insufficient documentation

## 2021-04-18 NOTE — Assessment & Plan Note (Signed)
paxlovid prescribed, advised to isolate x 1 week, stay hydrated and get adequate rest

## 2021-04-18 NOTE — Assessment & Plan Note (Signed)
Z pack prescribed 

## 2021-04-18 NOTE — Assessment & Plan Note (Signed)
Z pack, tessalon perles and phenergan DM are prescribed ?

## 2021-04-25 ENCOUNTER — Other Ambulatory Visit: Payer: Self-pay

## 2021-04-25 ENCOUNTER — Ambulatory Visit (HOSPITAL_COMMUNITY)
Admission: RE | Admit: 2021-04-25 | Discharge: 2021-04-25 | Disposition: A | Discharge status: Home / Self Care | Payer: BC Managed Care – PPO | Source: Ambulatory Visit | Attending: Family Medicine | Admitting: Family Medicine

## 2021-04-25 DIAGNOSIS — Z1231 Encounter for screening mammogram for malignant neoplasm of breast: Secondary | ICD-10-CM | POA: Diagnosis present

## 2021-04-26 ENCOUNTER — Other Ambulatory Visit (HOSPITAL_COMMUNITY): Payer: Self-pay | Admitting: Psychiatry

## 2021-04-26 ENCOUNTER — Other Ambulatory Visit: Payer: Self-pay | Admitting: Family Medicine

## 2021-04-26 DIAGNOSIS — F321 Major depressive disorder, single episode, moderate: Secondary | ICD-10-CM

## 2021-04-29 LAB — CMP14+EGFR
ALT: 11 IU/L (ref 0–32)
AST: 12 IU/L (ref 0–40)
Albumin/Globulin Ratio: 1.8 (ref 1.2–2.2)
Albumin: 4.1 g/dL (ref 3.8–4.8)
Alkaline Phosphatase: 94 IU/L (ref 44–121)
BUN/Creatinine Ratio: 21 (ref 9–23)
BUN: 16 mg/dL (ref 6–24)
Bilirubin Total: <0.2 mg/dL (ref 0.0–1.2)
CO2: 23 mmol/L (ref 20–29)
Calcium: 9.3 mg/dL (ref 8.7–10.2)
Chloride: 103 mmol/L (ref 96–106)
Creatinine, Ser: 0.77 mg/dL (ref 0.57–1.00)
Globulin, Total: 2.3 g/dL (ref 1.5–4.5)
Glucose: 93 mg/dL (ref 70–99)
Potassium: 4.5 mmol/L (ref 3.5–5.2)
Sodium: 139 mmol/L (ref 134–144)
Total Protein: 6.4 g/dL (ref 6.0–8.5)
eGFR: 100 mL/min/{1.73_m2} (ref >59)

## 2021-04-29 LAB — CBC
Hematocrit: 35.5 % (ref 34.0–46.6)
Hemoglobin: 11.8 g/dL (ref 11.1–15.9)
MCH: 29.1 pg (ref 26.6–33.0)
MCHC: 33.2 g/dL (ref 31.5–35.7)
MCV: 87 fL (ref 79–97)
Platelets: 320 10*3/uL (ref 150–450)
RBC: 4.06 x10E6/uL (ref 3.77–5.28)
RDW: 13.5 % (ref 11.7–15.4)
WBC: 7.7 10*3/uL (ref 3.4–10.8)

## 2021-04-29 LAB — HEMOGLOBIN A1C
Est. average glucose Bld gHb Est-mCnc: 111 mg/dL
Hgb A1c MFr Bld: 5.5 % (ref 4.8–5.6)

## 2021-04-29 LAB — VITAMIN D 25 HYDROXY (VIT D DEFICIENCY, FRACTURES): Vit D, 25-Hydroxy: 36 ng/mL (ref 30.0–100.0)

## 2021-04-29 LAB — TSH: TSH: 3.13 u[IU]/mL (ref 0.450–4.500)

## 2021-04-29 LAB — LIPID PANEL
Chol/HDL Ratio: 4 ratio (ref 0.0–4.4)
Cholesterol, Total: 227 mg/dL — ABNORMAL HIGH (ref 100–199)
HDL: 57 mg/dL (ref >39)
LDL Chol Calc (NIH): 132 mg/dL — ABNORMAL HIGH (ref 0–99)
Triglycerides: 212 mg/dL — ABNORMAL HIGH (ref 0–149)
VLDL Cholesterol Cal: 38 mg/dL (ref 5–40)

## 2021-04-29 LAB — HEPATITIS C ANTIBODY: Hep C Virus Ab: <0.1 s/co ratio (ref 0.0–0.9)

## 2021-04-30 NOTE — Telephone Encounter (Signed)
Call for appt

## 2021-05-02 ENCOUNTER — Encounter: Payer: BC Managed Care – PPO | Admitting: Family Medicine

## 2021-05-04 ENCOUNTER — Ambulatory Visit: Payer: BC Managed Care – PPO | Admitting: Gastroenterology

## 2021-05-13 ENCOUNTER — Other Ambulatory Visit (HOSPITAL_COMMUNITY): Payer: Self-pay | Admitting: Psychiatry

## 2021-05-14 NOTE — Telephone Encounter (Signed)
Call for appt

## 2021-05-15 NOTE — Telephone Encounter (Signed)
LMOM

## 2021-06-07 ENCOUNTER — Encounter: Payer: BC Managed Care – PPO | Admitting: Family Medicine

## 2021-06-11 ENCOUNTER — Telehealth (HOSPITAL_COMMUNITY): Payer: Self-pay | Admitting: Psychiatry

## 2021-06-15 ENCOUNTER — Other Ambulatory Visit (HOSPITAL_COMMUNITY): Payer: Self-pay | Admitting: Psychiatry

## 2021-06-15 ENCOUNTER — Other Ambulatory Visit: Payer: Self-pay | Admitting: Family Medicine

## 2021-06-15 DIAGNOSIS — F321 Major depressive disorder, single episode, moderate: Secondary | ICD-10-CM

## 2021-06-18 NOTE — Telephone Encounter (Signed)
Pr need appt, last refill until appt scheduled

## 2021-06-21 ENCOUNTER — Ambulatory Visit (INDEPENDENT_AMBULATORY_CARE_PROVIDER_SITE_OTHER): Payer: BC Managed Care – PPO | Admitting: Family Medicine

## 2021-06-21 ENCOUNTER — Other Ambulatory Visit: Payer: Self-pay

## 2021-06-21 ENCOUNTER — Encounter: Payer: Self-pay | Admitting: Family Medicine

## 2021-06-21 VITALS — BP 124/84 | HR 84 | Ht 63.0 in | Wt 342.0 lb

## 2021-06-21 DIAGNOSIS — Z0000 Encounter for general adult medical examination without abnormal findings: Secondary | ICD-10-CM | POA: Diagnosis not present

## 2021-06-21 DIAGNOSIS — Z23 Encounter for immunization: Secondary | ICD-10-CM

## 2021-06-21 NOTE — Patient Instructions (Signed)
F/U in September with pap, call if you need me sooner  Flu vaccine today  Left knee pain  will be referred to Dr Romeo Apple x 4 month  Fasting lipid panel  1 week before next appointment  Congrats on weight loss , keep it up!  Thanks for choosing [facility], we consider it a privelige to serve you.   Nurse pls re check BP , and document if lower  Thanks for choosing [facility], we consider it a privelige to serve you.

## 2021-06-24 ENCOUNTER — Encounter: Payer: Self-pay | Admitting: Family Medicine

## 2021-06-24 NOTE — Progress Notes (Signed)
° ° °  Claire [name]     MRN: [id number]      DOB: [birth year]  HPI: Patient is in for annual physical exam. No other health concerns are expressed or addressed at the visit. C/o left knee knee pain x 4 months Recent labs,  are reviewed. Immunization is reviewed , and  updated Has joined weight loss program through dietary change and is doing very well.   PE: BP 124/84 (BP Location: Right Arm, Patient Position: Sitting, Cuff Size: Large)    Pulse 84    Ht 5\' 3"  (1.6 m)    Wt (!) 342 lb (155.1 kg)    LMP 06/11/2021 (Approximate)    SpO2 98%    BMI 60.58 kg/m   Pleasant  female, alert and oriented x 3, in no cardio-pulmonary distress. Afebrile. HEENT No facial trauma or asymetry. Sinuses non tender.  Extra occullar muscles intact.. External ears normal, . Neck: supple, no adenopathy,JVD or thyromegaly.No bruits.  Chest: Clear to ascultation bilaterally.No crackles or wheezes. Non tender to palpation  Cardiovascular system; Heart sounds normal,  S1 and  S2 ,no S3.  No murmur, or thrill. Apical beat not displaced Peripheral pulses normal.  Abdomen: Soft, non tender  GU: Deferred, needs pap will return for this  Musculoskeletal exam: Full ROM of spine, hips , shoulders and knees. No deformity ,swelling or crepitus noted. No muscle wasting or atrophy.   Neurologic: Cranial nerves 2 to 12 intact. Power, tone ,sensation and reflexes normal throughout. No disturbance in gait. No tremor.  Skin: Intact, no ulceration, erythema , scaling or rash noted. Pigmentation normal throughout  Psych; Normal mood and affect. Judgement and concentration normal   Assessment & Plan:  Annual physical exam Annual exam as documented. Counseling done  re healthy lifestyle involving commitment to 150 minutes exercise per week, heart healthy diet, and attaining healthy weight.The importance of adequate sleep also discussed. Regular seat belt use and home safety, is also discussed. Changes in  health habits are decided on by the patient with goals and time frames  set for achieving them. Immunization and cancer screening needs are specifically addressed at this visit.

## 2021-06-24 NOTE — Assessment & Plan Note (Signed)

## 2021-07-16 ENCOUNTER — Telehealth (INDEPENDENT_AMBULATORY_CARE_PROVIDER_SITE_OTHER): Payer: BC Managed Care – PPO | Admitting: Psychiatry

## 2021-07-16 ENCOUNTER — Encounter (HOSPITAL_COMMUNITY): Payer: Self-pay | Admitting: Psychiatry

## 2021-07-16 ENCOUNTER — Other Ambulatory Visit: Payer: Self-pay

## 2021-07-16 DIAGNOSIS — F321 Major depressive disorder, single episode, moderate: Secondary | ICD-10-CM | POA: Diagnosis not present

## 2021-07-16 MED ORDER — VORTIOXETINE HBR 10 MG PO TABS: 10.0000 mg | ORAL_TABLET | Freq: Every day | ORAL | 5 refills | Status: DC

## 2021-07-16 MED ORDER — VORTIOXETINE HBR 20 MG PO TABS
20.0000 mg | ORAL_TABLET | Freq: Every day | ORAL | 5 refills | Status: DC
Start: 2021-07-16 — End: 2022-01-15

## 2021-07-16 MED ORDER — VENLAFAXINE HCL ER 150 MG PO CP24: ORAL_CAPSULE | ORAL | 2 refills | Status: DC

## 2021-07-16 NOTE — Progress Notes (Signed)
Virtual Visit via Telephone Note ? ?I connected with Claire [name] on 07/16/21 at  3:40 PM EDT by telephone and verified that I am speaking with the correct person using two identifiers. ? ?Location: ?Patient: home ?Provider: office ?  ?I discussed the limitations, risks, security and privacy concerns of performing an evaluation and management service by telephone and the availability of in person appointments. I also discussed with the patient that there may be a patient responsible charge related to this service. The patient expressed understanding and agreed to proceed. ? ? ? ? ?  ?I discussed the assessment and treatment plan with the patient. The patient was provided an opportunity to ask questions and all were answered. The patient agreed with the plan and demonstrated an understanding of the instructions. ?  ?The patient was advised to call back or seek an in-person evaluation if the symptoms worsen or if the condition fails to improve as anticipated. ? ?I provided 12 minutes of non-face-to-face time during this encounter. ? ? ?Diannia Ruder, MD ? ?BH MD/PA/NP OP Progress Note ? ?07/16/2021 3:46 PM ?Claire [name]  ?MRN:  [id number] ? ?Chief Complaint:  ?Chief Complaint  ?Patient presents with  ? Depression  ? Anxiety  ? Follow-up  ? ?HPI: This patient is a 41 year old single white female who lives alone in [place]. 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  She also works part-time in a Neurosurgeon? ? ?The patient returns for follow-up after almost a year.  She states that she is still working all the time and has lost track of time.  She states that in general her mood is good.  Sometimes she is very frustrated at the middle school and wants to quit but she is intending to stick it out.  She is sleeping well.  Her energy is good.  She denies anxiety or panic attacks.  She denies thoughts of suicide or self-harm.  She feels that her medications continue to work well with minimal side  effect. ?Visit Diagnosis:  ?  ICD-10-CM   ?1. Moderate single current episode of major depressive disorder (HCC)  F32.1 venlafaxine XR (EFFEXOR-XR) 150 MG 24 hr capsule  ?  ? ? ?Past Psychiatric History: none ? ?Past Medical History:  ?Past Medical History:  ?Diagnosis Date  ? Acne   ? facial  ? Arthritis   ? Depression   ? Phreesia 05/24/2020  ? Migraines   ? Nicotine addiction   ? Obesity   ?  ?Past Surgical History:  ?Procedure Laterality Date  ? CHOLECYSTECTOMY    ? COLONOSCOPY  10/2009  ? Dr. Jena Gauss; minimal anal canal hemorrhoids, otherwise normal rectum and colon.  Recommended repeat at age 19 due to family history.  ? ? ?Family Psychiatric History: see below ? ?Family History:  ?Family History  ?Problem Relation Age of Onset  ? Hypertension Mother   ? Cancer Mother 58  ?     colon  ? Obesity Mother   ? Hypertension Father   ? Diabetes Father   ? Hyperlipidemia Father   ? Obesity Father   ? ? ?Social History:  ?Social History  ? ?Socioeconomic History  ? Marital status: Single  ?  Spouse name: Not on file  ? Number of children: 0  ? Years of education: Bachelors  ? Highest education level: Not on file  ?Occupational History  ? 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 Smoking status: Former  ?  Types: Cigarettes  ?  Quit date: 11/13/2008  ?  Years since quitting: 12.6  ? Smokeless tobacco: Never  ? Tobacco comments:  ?  Quit 2008, 04-02-2016 per pt stopped 2010  ?Vaping Use  ? Vaping Use: Never used  ?Substance and Sexual Activity  ? Alcohol use: No  ?  Alcohol/week: 0.0 standard drinks  ?  Comment: occasional use  a beer once every 3 months or so, 04-02-2016 per pt occas.   ? Drug use: No  ?  Comment: 04-02-2016 per pt no  ? Sexual activity: Not Currently  ?  Birth control/protection: Pill  ?Other Topics Concern  ? Not on file  ?Social History Narrative  ? Lives at home alone.  ? Right-handed.  ? Occasional use of caffeine.  ? ?Social Determinants of Health  ? ?Financial Resource Strain: Not on file  ?Food  Insecurity: Not on file  ?Transportation Needs: Not on file  ?Physical Activity: Not on file  ?Stress: Not on file  ?Social Connections: Not on file  ? ? ?Allergies: No Known Allergies ? ?Metabolic Disorder Labs: ?Lab Results  ?Component Value Date  ? HGBA1C 5.5 04/27/2021  ? MPG 105 11/30/2018  ? MPG 103 11/06/2016  ? ?No results found for: PROLACTIN ?Lab Results  ?Component Value Date  ? CHOL 227 (H) 04/27/2021  ? TRIG 212 (H) 04/27/2021  ? HDL 57 04/27/2021  ? CHOLHDL 4.0 04/27/2021  ? VLDL 31 (H) 11/06/2016  ? LDLCALC 132 (H) 04/27/2021  ? LDLCALC 135 (H) 08/23/2020  ? ?Lab Results  ?Component Value Date  ? TSH 3.130 04/27/2021  ? TSH 2.380 08/23/2020  ? ? ?Therapeutic Level Labs: ?No results found for: LITHIUM ?No results found for: VALPROATE ?No components found for:  CBMZ ? ?Current Medications: ?Current Outpatient Medications  ?Medication Sig Dispense Refill  ? Glucosamine HCl (GLUCOSAMINE PO) Take by mouth daily.    ? ibuprofen (ADVIL) 800 MG tablet Take 1 tablet (800 mg total) by mouth 3 (three) times daily. 21 tablet 0  ? ibuprofen (ADVIL) 800 MG tablet TAKE 1 TABLET BY MOUTH TWICE DAILY AS NEEDED FOR HEADACHE. MAXIMUM USE OF TWICE A WEEK 30 tablet 0  ? loratadine (CLARITIN) 10 MG tablet TAKE ONE TABLET BY MOUTH ONCE DAILY 90 tablet 3  ? propranolol (INDERAL) 20 MG tablet Take 1 tablet by mouth twice daily 180 tablet 0  ? rizatriptan (MAXALT-MLT) 10 MG disintegrating tablet Take 1 tablet (10 mg total) by mouth as needed for migraine. May repeat in 2 hours if needed 10 tablet 3  ? rizatriptan (MAXALT-MLT) 10 MG disintegrating tablet Take 1 tablet (10 mg total) by mouth as needed for migraine. May repeat in 2 hours if needed 10 tablet 2  ? TRI-SPRINTEC 0.18/0.215/0.25 MG-35 MCG tablet Take 1 tablet by mouth once daily 84 tablet 0  ? venlafaxine XR (EFFEXOR-XR) 150 MG 24 hr capsule TAKE 2 CAPSULES BY MOUTH ONCE DAILY WITH BREAKFAST 180 capsule 2  ? vortioxetine HBr (TRINTELLIX) 10 MG TABS tablet Take 1  tablet (10 mg total) by mouth daily. 30 tablet 5  ? vortioxetine HBr (TRINTELLIX) 20 MG TABS tablet Take 1 tablet (20 mg total) by mouth daily. 30 tablet 5  ? ?No current facility-administered medications for this visit.  ? ? ? ?Musculoskeletal: ?Strength & Muscle Tone: na ?Gait & Station: na ?Patient leans: N/A ? ?Psychiatric Specialty Exam: ?Review of Systems  ?All other systems reviewed and are negative.  ?There were no vitals taken for this visit.There  is no height or weight on file to calculate BMI.  ?General Appearance: NA  ?Eye Contact:  NA  ?Speech:  Clear and Coherent  ?Volume:  Normal  ?Mood:  Euthymic  ?Affect:  NA  ?Thought Process:  Goal Directed  ?Orientation:  Full (Time, Place, and Person)  ?Thought Content: WDL   ?Suicidal Thoughts:  No  ?Homicidal Thoughts:  No  ?Memory:  Immediate;   Good ?Recent;   Good ?Remote;   Good  ?Judgement:  Good  ?Insight:  Good  ?Psychomotor Activity:  Normal  ?Concentration:  Concentration: Good and Attention Span: Good  ?Recall:  Good  ?Fund of Knowledge: Good  ?Language: Good  ?Akathisia:  No  ?Handed:  Right  ?AIMS (if indicated): not done  ?Assets:  Communication Skills ?Desire for Improvement ?Physical Health ?Resilience ?Social Support ?Talents/Skills ?Vocational/Educational  ?ADL's:  Impaired  ?Cognition: WNL  ?Sleep:  Good  ? ?Screenings: ?GAD-7   ? ?Flowsheet Row [facility]  ?Total GAD-7 Score 9  ? ?  ? ?PHQ2-9   ? ?Flowsheet Row Video Visit from [facility]16[facility]2021 [facility] Visit from [facility]2021 [facility] Visit from 04/17/2021 in [facility] Visit from 04/02/2021 in [facility]2020 [facility]  ?PHQ-2 Total Score 0 2 1 1 2   ?PHQ-9 Total Score -- 4 1 1 4   ? ?  ? ?Flowsheet Row Video Visit from 07/16/2021 in [facility]  [facility]  ?C-SSRS RISK CATEGORY No Risk No Risk  ? ?  ? ? ? ?Assessment and Plan:  ?This patient is a 41 year old female with a history of depression.  She conti

## 2021-07-30 ENCOUNTER — Other Ambulatory Visit: Payer: Self-pay | Admitting: Family Medicine

## 2021-08-26 NOTE — Progress Notes (Signed)
? ?Referring Provider: Kerri Perches, MD ?Primary Care Physician:  Kerri Perches, MD ?Primary Gastroenterologist:  Dr. Jena Gauss ? ?Chief Complaint  ?Patient presents with  ? Colonoscopy  ? ? ?HPI:   ?Claire [name] is a 41 y.o. female presenting today at the request of Lodema Hong Milus Mallick, MD  for consult colonoscopy.  Last colonoscopy in July 2011 due to paper hematochezia with minimal anal canal hemorrhoids, otherwise normal rectum and colon.  Recommended repeat colonoscopy at age 5 due to family history.  ? ?Today: ?Reports she is doing well overall.  No GI concerns.  Denies abdominal pain, constipation, diarrhea, BRBPR, melena, unintentional weight loss.  Admits to occasional reflux symptoms if eating something spicy, but nothing routine.  Denies nausea, vomiting, dysphagia. ? ?Past Medical History:  ?Diagnosis Date  ? Acne   ? facial  ? Arthritis   ? Depression   ? Phreesia 05/24/2020  ? Migraines   ? Nicotine addiction   ? Obesity   ? ? ?Past Surgical History:  ?Procedure Laterality Date  ? CHOLECYSTECTOMY    ? COLONOSCOPY  10/2009  ? Dr. Jena Gauss; minimal anal canal hemorrhoids, otherwise normal rectum and colon.  Recommended repeat at age 89 due to family history.  ? ? ?Current Outpatient Medications  ?Medication Sig Dispense Refill  ? Glucosamine HCl (GLUCOSAMINE PO) Take by mouth daily.    ? ibuprofen (ADVIL) 800 MG tablet Take 1 tablet (800 mg total) by mouth 3 (three) times daily. 21 tablet 0  ? ibuprofen (ADVIL) 800 MG tablet TAKE 1 TABLET BY MOUTH TWICE DAILY AS NEEDED FOR HEADACHE. MAXIMUM USE OF TWICE A WEEK 30 tablet 0  ? loratadine (CLARITIN) 10 MG tablet TAKE ONE TABLET BY MOUTH ONCE DAILY 90 tablet 3  ? propranolol (INDERAL) 20 MG tablet Take 1 tablet by mouth twice daily 180 tablet 0  ? rizatriptan (MAXALT-MLT) 10 MG disintegrating tablet Take 1 tablet (10 mg total) by mouth as needed for migraine. May repeat in 2 hours if needed 10 tablet 3  ? TRI-SPRINTEC 0.18/0.215/0.25 MG-35 MCG  tablet Take 1 tablet by mouth once daily 84 tablet 0  ? venlafaxine XR (EFFEXOR-XR) 150 MG 24 hr capsule TAKE 2 CAPSULES BY MOUTH ONCE DAILY WITH BREAKFAST 180 capsule 2  ? vortioxetine HBr (TRINTELLIX) 10 MG TABS tablet Take 1 tablet (10 mg total) by mouth daily. 30 tablet 5  ? vortioxetine HBr (TRINTELLIX) 20 MG TABS tablet Take 1 tablet (20 mg total) by mouth daily. 30 tablet 5  ? rizatriptan (MAXALT-MLT) 10 MG disintegrating tablet Take 1 tablet (10 mg total) by mouth as needed for migraine. May repeat in 2 hours if needed 10 tablet 2  ? ?No current facility-administered medications for this visit.  ? ? ?Allergies as of 08/27/2021  ? (No Known Allergies)  ? ? ?Family History  ?Problem Relation Age of Onset  ? Hypertension Mother   ? Cancer Mother 37  ?     colon  ? Obesity Mother   ? Hypertension Father   ? Diabetes Father   ? Hyperlipidemia Father   ? Obesity Father   ? ? ?Social History  ? ?Socioeconomic History  ? Marital status: Single  ?  Spouse name: Not on file  ? Number of children: 0  ? Years of education: Bachelors  ? Highest education level: Not on file  ?Occupational History  ? 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 Smoking status: Former  ?  Types: Cigarettes  ?  Quit date: 11/13/2008  ?  Years since quitting: 12.7  ? Smokeless tobacco: Never  ? Tobacco comments:  ?  Quit 2008, 04-02-2016 per pt stopped 2010  ?Vaping Use  ? Vaping Use: Never used  ?Substance and Sexual Activity  ? Alcohol use: No  ?  Alcohol/week: 0.0 standard drinks  ?  Comment: occasional use  a beer once every 3 months or so, 04-02-2016 per pt occas.   ? Drug use: No  ?  Comment: 04-02-2016 per pt no  ? Sexual activity: Not Currently  ?  Birth control/protection: Pill  ?Other Topics Concern  ? Not on file  ?Social History Narrative  ? Lives at home alone.  ? Right-handed.  ? Occasional use of caffeine.  ? ?Social Determinants of Health  ? ?Financial Resource Strain: Not on file  ?Food Insecurity: Not on file  ?Transportation  Needs: Not on file  ?Physical Activity: Not on file  ?Stress: Not on file  ?Social Connections: Not on file  ?Intimate Partner Violence: Not on file  ? ? ?Review of Systems: ?Gen: Denies any fever, chills, cold or flulike symptoms, presyncope, syncope. ?CV: Denies chest pain, palpitations. ?Resp: Denies shortness of breath or cough. ?GI: See HPI ?GU : Denies urinary burning, urinary frequency, urinary hesitancy ?MS: Denies joint pain. ?Derm: Denies rash. ?Psych: Denies depression, anxiety. ?Heme: See HPI ? ?Physical Exam: ?BP 136/72   Pulse 68   Temp 97.6 ?F (36.4 ?C) (Temporal)   Ht 5\' 3"  (1.6 m)   Wt (!) 335 lb 9.6 oz (152.2 kg)   LMP 08/07/2021 (Approximate)   BMI 59.45 kg/m?  ?General:   Alert and oriented. Pleasant and cooperative. Well-nourished and well-developed.  ?Head:  Normocephalic and atraumatic. ?Eyes:  Without icterus, sclera clear and conjunctiva pink.  ?Ears:  Normal auditory acuity. ?Lungs:  Clear to auscultation bilaterally. No wheezes, rales, or rhonchi. No distress.  ?Heart:  S1, S2 present without murmurs appreciated.  ?Abdomen:  +BS, soft, non-tender and non-distended. No HSM noted. No guarding or rebound. No masses appreciated.  ?Rectal:  Deferred  ?Msk:  Symmetrical without gross deformities. Normal posture. ?Extremities:  Without edema. ?Neurologic:  Alert and  oriented x4;  grossly normal neurologically. ?Skin:  Intact without significant lesions or rashes. ?Psych:  Normal mood and affect. ? ? ? ?Assessment:  ?41 year old female with history of depression, migraines, presenting today to discuss scheduling high risk screening colonoscopy.  Her last colonoscopy was in July 2011 due to paper hematochezia revealing minimal anal canal hemorrhoids, otherwise normal rectum and colon.  Recommended repeat colonoscopy at age 6 due to family history of colon cancer in her mother at age 59.  Clinically, she is doing well without any significant GI symptoms or alarm symptoms. ? ? ?Plan:   ?Proceed with colonoscopy with propofol by Dr. 44 in near future. The risks, benefits, and alternatives have been discussed with the patient in detail. The patient states understanding and desires to proceed. ?ASA 3 ?Follow-up as needed. ? ? ?Jena Gauss, PA-C ?[facility] ?08/27/2021 ?  ?

## 2021-08-27 ENCOUNTER — Encounter: Payer: Self-pay | Admitting: Gastroenterology

## 2021-08-27 ENCOUNTER — Ambulatory Visit (INDEPENDENT_AMBULATORY_CARE_PROVIDER_SITE_OTHER): Payer: BC Managed Care – PPO | Admitting: Gastroenterology

## 2021-08-27 VITALS — BP 136/72 | HR 68 | Temp 97.6°F | Ht 63.0 in | Wt 335.6 lb

## 2021-08-27 DIAGNOSIS — Z1211 Encounter for screening for malignant neoplasm of colon: Secondary | ICD-10-CM | POA: Diagnosis not present

## 2021-08-27 DIAGNOSIS — Z800 Family history of malignant neoplasm of digestive organs: Secondary | ICD-10-CM

## 2021-08-27 NOTE — Patient Instructions (Signed)
We will arrange for you to have a colonoscopy in the near future with Dr. Jena Gauss. ? ?We will see you back in the office as needed.  Do not hesitate to call if you have any new GI concerns. ? ?It was a pleasure meeting you today! ? ?Ermalinda Memos, PA-C ?[facility] ? ?

## 2021-09-04 ENCOUNTER — Telehealth: Payer: Self-pay | Admitting: *Deleted

## 2021-09-04 NOTE — Telephone Encounter (Signed)
Called pt to schedule TCS with Dr. Jena Gauss, asa 3 but she prefers July. Will call once we receive that schedule ?

## 2021-09-17 ENCOUNTER — Other Ambulatory Visit: Payer: Self-pay | Admitting: Family Medicine

## 2021-09-19 NOTE — Telephone Encounter (Signed)
LMOVM to call back for pt ?

## 2021-10-17 ENCOUNTER — Other Ambulatory Visit: Payer: Self-pay | Admitting: Family Medicine

## 2021-10-20 ENCOUNTER — Other Ambulatory Visit: Payer: Self-pay | Admitting: Family Medicine

## 2021-11-01 ENCOUNTER — Other Ambulatory Visit: Payer: Self-pay | Admitting: Family Medicine

## 2021-11-19 ENCOUNTER — Encounter: Payer: Self-pay | Admitting: Family Medicine

## 2021-11-20 ENCOUNTER — Other Ambulatory Visit: Payer: Self-pay | Admitting: Family Medicine

## 2021-11-20 MED ORDER — PHENTERMINE HCL 37.5 MG PO TABS: ORAL_TABLET | ORAL | 0 refills | Status: DC

## 2021-12-17 ENCOUNTER — Other Ambulatory Visit: Payer: Self-pay | Admitting: Family Medicine

## 2022-01-03 ENCOUNTER — Other Ambulatory Visit: Payer: Self-pay | Admitting: Family Medicine

## 2022-01-08 ENCOUNTER — Ambulatory Visit: Payer: BC Managed Care – PPO | Admitting: Family Medicine

## 2022-01-08 ENCOUNTER — Encounter: Payer: Self-pay | Admitting: Family Medicine

## 2022-01-08 ENCOUNTER — Other Ambulatory Visit (HOSPITAL_COMMUNITY)
Admission: RE | Admit: 2022-01-08 | Discharge: 2022-01-08 | Disposition: A | Discharge status: Home / Self Care | Payer: BC Managed Care – PPO | Source: Ambulatory Visit | Attending: Family Medicine | Admitting: Family Medicine

## 2022-01-08 DIAGNOSIS — E559 Vitamin D deficiency, unspecified: Secondary | ICD-10-CM

## 2022-01-08 DIAGNOSIS — R7301 Impaired fasting glucose: Secondary | ICD-10-CM

## 2022-01-08 DIAGNOSIS — Z01419 Encounter for gynecological examination (general) (routine) without abnormal findings: Secondary | ICD-10-CM

## 2022-01-08 DIAGNOSIS — Z23 Encounter for immunization: Secondary | ICD-10-CM

## 2022-01-08 DIAGNOSIS — F322 Major depressive disorder, single episode, severe without psychotic features: Secondary | ICD-10-CM | POA: Diagnosis not present

## 2022-01-08 DIAGNOSIS — Z124 Encounter for screening for malignant neoplasm of cervix: Secondary | ICD-10-CM | POA: Insufficient documentation

## 2022-01-08 DIAGNOSIS — E785 Hyperlipidemia, unspecified: Secondary | ICD-10-CM

## 2022-01-08 MED ORDER — SEMAGLUTIDE-WEIGHT MANAGEMENT 0.25 MG/0.5ML ~~LOC~~ SOAJ: 0.2500 mg | SUBCUTANEOUS | 0 refills | Status: DC

## 2022-01-08 NOTE — Assessment & Plan Note (Signed)
Managed by Psych and controlled 

## 2022-01-08 NOTE — Assessment & Plan Note (Signed)
Controlled, no change in medication  

## 2022-01-08 NOTE — Assessment & Plan Note (Signed)
Exam as documented , pap sent 

## 2022-01-08 NOTE — Progress Notes (Signed)
   Claire [name]     MRN: [id number]      DOB: 10-Mar-1981   HPI Ms. Grieshop is here for follow up and re-evaluation of chronic medical conditions, medication management and review of any available recent lab and radiology data.  Preventive health is updated, specifically  Cancer screening and Immunization.   Questions or concerns regarding consultations or procedures which the PT has had in the interim are  addressed. The PT c/o " allergic reaction" to phentermine, burning in roof of mouth, also states no appetite suppression with the phentermine Ongoing weight gsin, may consider surgery if unable to get medication to help with weight loss Needs pap,   ROS Denies recent fever or chills. Denies sinus pressure, nasal congestion, ear pain or sore throat. Denies chest congestion, productive cough or wheezing. Denies chest pains, palpitations and leg swelling Denies abdominal pain, nausea, vomiting,diarrhea or constipation.   Denies dysuria, frequency, hesitancy or incontinence. Denies joint pain, swelling and limitation in mobility. Denies headaches, seizures, numbness, or tingling. Denies depression, anxiety or insomnia. Denies skin break down or rash.   PE  BP 122/79 (BP Location: Right Arm, Patient Position: Sitting, Cuff Size: Large)   Pulse 77   Ht 5\' 3"  (1.6 m)   Wt (!) 346 lb 1.9 oz (157 kg)   SpO2 97%   BMI 61.31 kg/m   Patient alert and oriented and in no cardiopulmonary distress.  HEENT: No facial asymmetry, EOMI,     Neck supple .  Chest: Clear to auscultation bilaterally.  CVS: S1, S2 no murmurs, no S3.Regular rate.  ABD: Soft non tender.  Pelvic:No inginl adenopathy, female distribution of hair Inernal: Uterus not enlarged, no cervical motion or adnexal tenderness, physiologic d/c , no internal ulcers, no adnexal masses  Ext: No edema  MS: Adequate ROM spine, shoulders, hips and knees.  Skin: Intact, no ulcerations or rash noted.  Psych: Good eye contact,  normal affect. Memory intact not anxious or depressed appearing.  CNS: CN 2-12 intact, power,  normal throughout.no focal deficits noted.   Assessment & Plan  Periodic health assessment, Pap and pelvic Exam as documented , pap sent  Depression, major, single episode, severe (HCC) Managed by Psych and controlled  Chronic migraine Controlled, no change in medication   Morbid obesity Worsening, rx semaglutide  Patient re-educated about  the importance of commitment to a  minimum of 150 minutes of exercise per week as able.  The importance of healthy food choices with portion control discussed, as well as eating regularly and within a 12 hour window most days. The need to choose "clean , green" food 50 to 75% of the time is discussed, as well as to make water the primary drink and set a goal of 64 ounces water daily.       01/08/2022    4:17 PM 08/27/2021    1:50 PM 06/21/2021    3:48 PM  Weight /BMI  Weight 346 lb 1.9 oz 335 lb 9.6 oz 342 lb  Height 5\' 3"  (1.6 m) 5\' 3"  (1.6 m) 5\' 3"  (1.6 m)  BMI 61.31 kg/m2 59.45 kg/m2 60.58 kg/m2

## 2022-01-08 NOTE — Patient Instructions (Signed)
F/U in early Decembert re evaluate weight  New is once weekly wegovy, if covered I will be increasing the dose as needed and tolerated  Please start to arrange your December colonoscopy  Flu vaccine today  Pap sent today  Fastign CBC, lipid, cmp and  eGFR, tSH, vit D and HBA1C as soon as possible  It is important that you exercise regularly at least 30 minutes 5 times a week. If you develop chest pain, have severe difficulty breathing, or feel very tired, stop exercising immediately and seek medical attention   Think about what you will eat, plan ahead. Choose " clean, green, fresh or frozen" over canned, processed or packaged foods which are more sugary, salty and fatty. 70 to 75% of food eaten should be vegetables and fruit. Three meals at set times with snacks allowed between meals, but they must be fruit or vegetables. Aim to eat over a 12 hour period , example 7 am to 7 pm, and STOP after  your last meal of the day. Drink water,generally about 64 ounces per day, no other drink is as healthy. Fruit juice is best enjoyed in a healthy way, by EATING the fruit.   Thanks for choosing [facility], we consider it a privelige to serve you.

## 2022-01-08 NOTE — Assessment & Plan Note (Signed)
Worsening, rx semaglutide  Patient re-educated about  the importance of commitment to a  minimum of 150 minutes of exercise per week as able.  The importance of healthy food choices with portion control discussed, as well as eating regularly and within a 12 hour window most days. The need to choose "clean , green" food 50 to 75% of the time is discussed, as well as to make water the primary drink and set a goal of 64 ounces water daily.       01/08/2022    4:17 PM 08/27/2021    1:50 PM 06/21/2021    3:48 PM  Weight /BMI  Weight 346 lb 1.9 oz 335 lb 9.6 oz 342 lb  Height 5\' 3"  (1.6 m) 5\' 3"  (1.6 m) 5\' 3"  (1.6 m)  BMI 61.31 kg/m2 59.45 kg/m2 60.58 kg/m2

## 2022-01-11 LAB — CYTOLOGY - PAP
Adequacy: ABSENT
Comment: NEGATIVE
Diagnosis: NEGATIVE
High risk HPV: NEGATIVE

## 2022-01-15 ENCOUNTER — Other Ambulatory Visit (HOSPITAL_COMMUNITY): Payer: Self-pay | Admitting: Psychiatry

## 2022-01-15 ENCOUNTER — Other Ambulatory Visit: Payer: Self-pay | Admitting: Family Medicine

## 2022-01-15 NOTE — Telephone Encounter (Signed)
Call for appt

## 2022-01-16 ENCOUNTER — Encounter: Payer: Self-pay | Admitting: Family Medicine

## 2022-01-16 LAB — CMP14+EGFR
ALT: 9 IU/L (ref 0–32)
AST: 7 IU/L (ref 0–40)
Albumin/Globulin Ratio: 1.8 (ref 1.2–2.2)
Albumin: 4 g/dL (ref 3.9–4.9)
Alkaline Phosphatase: 94 IU/L (ref 44–121)
BUN/Creatinine Ratio: 22 (ref 9–23)
BUN: 16 mg/dL (ref 6–24)
Bilirubin Total: 0.2 mg/dL (ref 0.0–1.2)
CO2: 22 mmol/L (ref 20–29)
Calcium: 9.4 mg/dL (ref 8.7–10.2)
Chloride: 103 mmol/L (ref 96–106)
Creatinine, Ser: 0.72 mg/dL (ref 0.57–1.00)
Globulin, Total: 2.2 g/dL (ref 1.5–4.5)
Glucose: 97 mg/dL (ref 70–99)
Potassium: 4.7 mmol/L (ref 3.5–5.2)
Sodium: 140 mmol/L (ref 134–144)
Total Protein: 6.2 g/dL (ref 6.0–8.5)
eGFR: 108 mL/min/{1.73_m2} (ref >59)

## 2022-01-16 LAB — CBC
Hematocrit: 36.5 % (ref 34.0–46.6)
Hemoglobin: 11.9 g/dL (ref 11.1–15.9)
MCH: 28.7 pg (ref 26.6–33.0)
MCHC: 32.6 g/dL (ref 31.5–35.7)
MCV: 88 fL (ref 79–97)
Platelets: 309 10*3/uL (ref 150–450)
RBC: 4.14 x10E6/uL (ref 3.77–5.28)
RDW: 13.1 % (ref 11.7–15.4)
WBC: 7.3 10*3/uL (ref 3.4–10.8)

## 2022-01-16 LAB — HEMOGLOBIN A1C
Est. average glucose Bld gHb Est-mCnc: 117 mg/dL
Hgb A1c MFr Bld: 5.7 % — ABNORMAL HIGH (ref 4.8–5.6)

## 2022-01-16 LAB — TSH: TSH: 2.15 u[IU]/mL (ref 0.450–4.500)

## 2022-01-16 LAB — LIPID PANEL
Chol/HDL Ratio: 3.8 ratio (ref 0.0–4.4)
Cholesterol, Total: 212 mg/dL — ABNORMAL HIGH (ref 100–199)
HDL: 56 mg/dL (ref >39)
LDL Chol Calc (NIH): 126 mg/dL — ABNORMAL HIGH (ref 0–99)
Triglycerides: 170 mg/dL — ABNORMAL HIGH (ref 0–149)
VLDL Cholesterol Cal: 30 mg/dL (ref 5–40)

## 2022-01-16 LAB — VITAMIN D 25 HYDROXY (VIT D DEFICIENCY, FRACTURES): Vit D, 25-Hydroxy: 45.8 ng/mL (ref 30.0–100.0)

## 2022-01-21 ENCOUNTER — Other Ambulatory Visit: Payer: Self-pay | Admitting: Family Medicine

## 2022-01-24 NOTE — Telephone Encounter (Signed)
LMTRC

## 2022-01-25 ENCOUNTER — Telehealth: Payer: Self-pay | Admitting: Family Medicine

## 2022-01-25 NOTE — Telephone Encounter (Signed)
Spoke with pt added to note in chart

## 2022-01-25 NOTE — Telephone Encounter (Signed)
Pt called back she states that she does has phentermine from last rx she will start taking it again

## 2022-01-25 NOTE — Telephone Encounter (Signed)
Pt returning Katie's call

## 2022-02-16 ENCOUNTER — Other Ambulatory Visit (HOSPITAL_COMMUNITY): Payer: Self-pay | Admitting: Psychiatry

## 2022-02-16 ENCOUNTER — Other Ambulatory Visit: Payer: Self-pay | Admitting: Family Medicine

## 2022-02-18 NOTE — Telephone Encounter (Signed)
Call for appt

## 2022-02-18 NOTE — Telephone Encounter (Signed)
Please send electronically if you would like  

## 2022-02-19 NOTE — Telephone Encounter (Signed)
Called pt to schedule no answer left vm  

## 2022-02-19 NOTE — Telephone Encounter (Signed)
Called pt no answer left vm 

## 2022-02-22 ENCOUNTER — Other Ambulatory Visit: Payer: Self-pay | Admitting: Family Medicine

## 2022-02-22 MED ORDER — PHENTERMINE HCL 37.5 MG PO TABS: 37.5000 mg | ORAL_TABLET | Freq: Every day | ORAL | 1 refills | Status: DC

## 2022-03-18 ENCOUNTER — Other Ambulatory Visit (HOSPITAL_COMMUNITY): Payer: Self-pay | Admitting: Psychiatry

## 2022-03-18 NOTE — Telephone Encounter (Signed)
Pt needs appt

## 2022-03-30 ENCOUNTER — Other Ambulatory Visit: Payer: Self-pay | Admitting: Family Medicine

## 2022-04-03 ENCOUNTER — Ambulatory Visit: Payer: BC Managed Care – PPO | Admitting: Family Medicine

## 2022-04-11 ENCOUNTER — Other Ambulatory Visit: Payer: Self-pay | Admitting: Family Medicine

## 2022-04-23 ENCOUNTER — Other Ambulatory Visit: Payer: Self-pay | Admitting: Family Medicine

## 2022-04-23 ENCOUNTER — Other Ambulatory Visit (HOSPITAL_COMMUNITY): Payer: Self-pay | Admitting: Psychiatry

## 2022-04-23 ENCOUNTER — Encounter: Payer: Self-pay | Admitting: Family Medicine

## 2022-04-23 DIAGNOSIS — F321 Major depressive disorder, single episode, moderate: Secondary | ICD-10-CM

## 2022-05-24 ENCOUNTER — Other Ambulatory Visit (HOSPITAL_COMMUNITY): Payer: Self-pay | Admitting: Psychiatry

## 2022-06-17 ENCOUNTER — Other Ambulatory Visit: Payer: Self-pay | Admitting: Family Medicine

## 2022-06-17 ENCOUNTER — Other Ambulatory Visit (HOSPITAL_COMMUNITY): Payer: Self-pay | Admitting: Psychiatry

## 2022-06-24 ENCOUNTER — Other Ambulatory Visit (HOSPITAL_COMMUNITY): Payer: Self-pay | Admitting: Psychiatry

## 2022-06-27 ENCOUNTER — Encounter: Payer: Self-pay | Admitting: Radiology

## 2022-06-27 ENCOUNTER — Other Ambulatory Visit (HOSPITAL_COMMUNITY): Payer: Self-pay | Admitting: Psychiatry

## 2022-06-30 ENCOUNTER — Encounter (HOSPITAL_COMMUNITY): Payer: Self-pay

## 2022-07-01 ENCOUNTER — Telehealth (INDEPENDENT_AMBULATORY_CARE_PROVIDER_SITE_OTHER): Payer: BC Managed Care – PPO | Admitting: Psychiatry

## 2022-07-01 ENCOUNTER — Encounter (HOSPITAL_COMMUNITY): Payer: Self-pay | Admitting: Psychiatry

## 2022-07-01 DIAGNOSIS — F321 Major depressive disorder, single episode, moderate: Secondary | ICD-10-CM | POA: Diagnosis not present

## 2022-07-01 MED ORDER — VORTIOXETINE HBR 20 MG PO TABS: 20.0000 mg | ORAL_TABLET | Freq: Every day | ORAL | 2 refills | Status: DC

## 2022-07-01 MED ORDER — VORTIOXETINE HBR 10 MG PO TABS: 10.0000 mg | ORAL_TABLET | Freq: Every day | ORAL | 2 refills | Status: DC

## 2022-07-01 MED ORDER — VENLAFAXINE HCL ER 150 MG PO CP24: ORAL_CAPSULE | ORAL | 2 refills | Status: DC

## 2022-07-01 NOTE — Progress Notes (Signed)
Virtual Visit via Video Note  I connected with Claire [name] on 07/01/22 at  4:00 PM EST by a video enabled telemedicine application and verified that I am speaking with the correct person using two identifiers.  Location: Patient: home Provider: office   I discussed the limitations of evaluation and management by telemedicine and the availability of in person appointments. The patient expressed understanding and agreed to proceed.    I discussed the assessment and treatment plan with the patient. The patient was provided an opportunity to ask questions and all were answered. The patient agreed with the plan and demonstrated an understanding of the instructions.   The patient was advised to call back or seek an in-person evaluation if the symptoms worsen or if the condition fails to improve as anticipated.  I provided 15 minutes of non-face-to-face time during this encounter.   Levonne Spiller, MD  [facility] MD/PA/NP OP Progress Note  07/01/2022 4:12 PM Claire [name]  MRN:  [id number]  Chief Complaint:  Chief Complaint  Patient presents with   Depression   Follow-up   HPI: This patient is a 42 year old single white female who lives alone in [place].  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  The patient returns for follow-up after almost a year.  She ran out of her medications and this is what prompted her to come back and.  For the most part she has been doing well.  She now has a boyfriend and they spend a lot of time together.  She still spends time with her family.  She has decided to stick with the middle school for now even though the kids can be very frustrating.  She denies significant depression.  She is sleeping well her energy is good she denies anxiety or panic attacks.  She is trying to get weight loss medicine approved and she is struggling to lose weight. Visit Diagnosis:    ICD-10-CM   1. Moderate single current episode of major depressive disorder  (HCC)  F32.1 venlafaxine XR (EFFEXOR-XR) 150 MG 24 hr capsule      Past Psychiatric History: none  Past Medical History:  Past Medical History:  Diagnosis Date   Acne    facial   Arthritis    Depression    Phreesia 05/24/2020   Migraines    Nicotine addiction    Obesity     Past Surgical History:  Procedure Laterality Date   CHOLECYSTECTOMY     COLONOSCOPY  10/2009   Dr. Gala Romney; minimal anal canal hemorrhoids, otherwise normal rectum and colon.  Recommended repeat at age 57 due to family history.    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    Drug use: No    Comment: 04-02-2016 per pt no   Sexual activity: Not Currently    Birth control/protection: Pill  Other Topics Concern   Not on file  Social History Narrative   Lives at home alone.   Right-handed.   Occasional use of caffeine.   Social Determinants of Health   Financial Resource Strain: Low Risk  (08/02/2019)   Overall Financial Resource Strain (CARDIA)    Difficulty of Paying Living Expenses: Not  very hard  Food Insecurity: Food Insecurity Present (08/02/2019)   Hunger Vital Sign    Worried About Running Out of Food in the Last Year: Sometimes true    Ran Out of Food in the Last Year: Never true  Transportation Needs: No Transportation Needs (08/02/2019)   PRAPARE - Hydrologist (Medical): No    Lack of Transportation (Non-Medical): No  Physical Activity: Inactive (08/02/2019)   Exercise Vital Sign    Days of Exercise per Week: 0 days    Minutes of Exercise per Session: 0 min  Stress: Stress Concern Present (08/02/2019)   [place]    Feeling of Stress : Rather much  Social Connections: Socially Isolated (08/02/2019)   Social Connection and Isolation Panel [NHANES]    Frequency of Communication with Friends and Family: More than three times a week    Frequency of Social Gatherings with Friends and Family: Twice a week    Attends Religious Services: Never    Marine scientist or Organizations: No    Attends Music therapist: Never    Marital Status: Never married    Allergies: No Known Allergies  Metabolic Disorder Labs: Lab Results  Component Value Date   HGBA1C 5.7 (H) 01/15/2022   MPG 105 11/30/2018   MPG 103 11/06/2016   No results found for: "PROLACTIN" Lab Results  Component Value Date   CHOL 212 (H) 01/15/2022   TRIG 170 (H) 01/15/2022   HDL 56 01/15/2022   CHOLHDL 3.8 01/15/2022   VLDL 31 (H) 11/06/2016   LDLCALC 126 (H) 01/15/2022   [place] 132 (H) 04/27/2021   Lab Results  Component Value Date   TSH 2.150 01/15/2022   TSH 3.130 04/27/2021    Therapeutic Level Labs: No results found for: "LITHIUM" No results found for: "VALPROATE" No results found for: "CBMZ"  Current Medications: Current Outpatient Medications  Medication Sig Dispense Refill   Glucosamine HCl (GLUCOSAMINE PO) Take by mouth daily.     ibuprofen (ADVIL) 800 MG tablet TAKE 1  TABLET BY MOUTH TWICE DAILY AS NEEDED FOR HEADACHE (  MAXIMUM  USE  OF  TWICE  A  WEEK) 30 tablet 0   loratadine (CLARITIN) 10 MG tablet TAKE ONE TABLET BY MOUTH ONCE DAILY 90 tablet 3   propranolol (INDERAL) 20 MG tablet Take 1 tablet by mouth twice daily 180 tablet 0   rizatriptan (MAXALT-MLT) 10 MG disintegrating tablet Take 1 tablet (10 mg total) by mouth as needed for migraine. May repeat in 2 hours if needed 10 tablet 2  Semaglutide-Weight Management 0.25 MG/0.5ML SOAJ Inject 0.25 mg into the skin once a week. 2 mL 0   TRI-SPRINTEC 0.18/0.215/0.25 MG-35 MCG tablet Take 1 tablet by mouth once daily 84 tablet 0   venlafaxine XR (EFFEXOR-XR) 150 MG 24 hr capsule TAKE 2 CAPSULES BY MOUTH ONCE DAILY WITH BREAKFAST 180 capsule 2   vortioxetine HBr (TRINTELLIX) 10 MG TABS tablet Take 1 tablet (10 mg total) by mouth daily. 30 tablet 2   vortioxetine HBr (TRINTELLIX) 20 MG TABS tablet Take 1 tablet (20 mg total) by mouth daily. 90 tablet 2   No current facility-administered medications for this visit.     Musculoskeletal: Strength & Muscle Tone: within normal limits Gait & Station: normal Patient leans: N/A  Psychiatric Specialty Exam: Review of Systems  All other systems reviewed and are negative.   There were no vitals taken for this visit.There is no height or weight on file to calculate BMI.  General Appearance: Casual and Fairly Groomed  Eye Contact:  Good  Speech:  Clear and Coherent  Volume:  Normal  Mood:  Euthymic  Affect:  Congruent  Thought Process:  Goal Directed  Orientation:  Full (Time, Place, and Person)  Thought Content: WDL   Suicidal Thoughts:  No  Homicidal Thoughts:  No  Memory:  Immediate;   Good Recent;   Good Remote;   Good  Judgement:  Good  Insight:  Good  Psychomotor Activity:  Normal  Concentration:  Concentration: Good and Attention Span: Good  Recall:  Good  Fund of Knowledge: Good  Language: Good  Akathisia:  No  Handed:  Right  AIMS (if  indicated): not done  Assets:  Communication Skills Desire for Improvement Physical Health Resilience Social Support Talents/Skills Vocational/Educational  ADL's:  Intact  Cognition: WNL  Sleep:  Good   Screenings: GAD-7    Flowsheet Row Office Visit from 08/02/2019 in [facility] for [place] at [facility]  Total GAD-7 Score 9      PHQ2-9    [place] Office Visit from 01/08/2022 in [facility] Video Visit from 07/16/2021 in [place] [facility] [place] Visit from 06/21/2021 in [facility] Care Office Visit from 04/17/2021 in [facility] Primary Care Office Visit from 04/02/2021 in [facility] Primary Care  PHQ-2 Total Score 1 0 '2 1 1  '$ PHQ-9 Total Score -- -- '4 1 1      '$ Flowsheet Row Video Visit from 07/16/2021 in [facility] [place] Video Visit from 07/26/2020 in [place] at [place] No Risk No Risk        Assessment and Plan: This patient is a 42 year old female with a history of depression.  She continues to do well on her current regimen.  She will continue Trintellix 30 mg daily as well as Effexor XR 300 mg daily both for depression.  She will return to see me in 6 months  Collaboration of Care: Collaboration of Care: Primary Care Provider AEB notes are shared with PCP on the epic system  Patient/Guardian was advised Release of Information must be obtained prior to any record release in order to collaborate their care with an outside provider. Patient/Guardian was advised if they have not already done so to contact the registration department to sign all necessary forms in order for [place] to release information regarding their care.   Consent: Patient/Guardian gives verbal consent  for treatment and assignment of benefits for services provided during this visit.  Patient/Guardian expressed understanding and agreed to proceed.    Levonne Spiller, MD 07/01/2022, 4:12 PM

## 2022-07-01 NOTE — Telephone Encounter (Signed)
Called patient an appt was made.

## 2022-08-11 ENCOUNTER — Other Ambulatory Visit: Payer: Self-pay | Admitting: Family Medicine

## 2022-09-17 ENCOUNTER — Other Ambulatory Visit: Payer: Self-pay | Admitting: Family Medicine

## 2022-09-29 ENCOUNTER — Other Ambulatory Visit: Payer: Self-pay | Admitting: Family Medicine

## 2022-10-14 ENCOUNTER — Other Ambulatory Visit (HOSPITAL_COMMUNITY): Payer: Self-pay | Admitting: Psychiatry

## 2022-11-11 ENCOUNTER — Other Ambulatory Visit: Payer: Self-pay | Admitting: Family Medicine

## 2022-11-15 ENCOUNTER — Other Ambulatory Visit (HOSPITAL_COMMUNITY): Payer: Self-pay | Admitting: Psychiatry

## 2022-11-26 ENCOUNTER — Encounter: Payer: Self-pay | Admitting: Family Medicine

## 2022-11-28 ENCOUNTER — Ambulatory Visit: Payer: BC Managed Care – PPO | Admitting: Family Medicine

## 2022-12-06 ENCOUNTER — Other Ambulatory Visit: Payer: Self-pay | Admitting: Family Medicine

## 2022-12-13 ENCOUNTER — Other Ambulatory Visit (HOSPITAL_COMMUNITY): Payer: Self-pay | Admitting: Psychiatry

## 2022-12-13 NOTE — Telephone Encounter (Signed)
Pt scheduled with Dr Tenny Craw 01/01/23. Please advise

## 2023-01-01 ENCOUNTER — Encounter (HOSPITAL_COMMUNITY): Payer: Self-pay | Admitting: Psychiatry

## 2023-01-01 ENCOUNTER — Telehealth (INDEPENDENT_AMBULATORY_CARE_PROVIDER_SITE_OTHER): Payer: BC Managed Care – PPO | Admitting: Psychiatry

## 2023-01-01 DIAGNOSIS — F321 Major depressive disorder, single episode, moderate: Secondary | ICD-10-CM

## 2023-01-01 MED ORDER — VENLAFAXINE HCL ER 150 MG PO CP24
ORAL_CAPSULE | ORAL | 2 refills | Status: DC
Start: 2023-01-01 — End: 2024-01-05

## 2023-01-01 MED ORDER — VORTIOXETINE HBR 20 MG PO TABS: 20.0000 mg | ORAL_TABLET | Freq: Every day | ORAL | 2 refills | Status: DC

## 2023-01-01 MED ORDER — VORTIOXETINE HBR 10 MG PO TABS: 10.0000 mg | ORAL_TABLET | Freq: Every day | ORAL | 0 refills | Status: DC

## 2023-01-01 NOTE — Progress Notes (Signed)
Virtual Visit via Video Note  I connected with Claire [name] on 01/01/23 at  4:00 PM EDT by a video enabled telemedicine application and verified that I am speaking with the correct person using two identifiers.  Location: Patient: home Provider: office   I discussed the limitations of evaluation and management by telemedicine and the availability of in person appointments. The patient expressed understanding and agreed to proceed.      I discussed the assessment and treatment plan with the patient. The patient was provided an opportunity to ask questions and all were answered. The patient agreed with the plan and demonstrated an understanding of the instructions.   The patient was advised to call back or seek an in-person evaluation if the symptoms worsen or if the condition fails to improve as anticipated.  I provided 15 minutes of non-face-to-face time during this encounter.   Diannia Ruder, MD  [facility] MD/PA/NP OP Progress Note  01/01/2023 4:10 PM Claire [name]  MRN:  [id number]  Chief Complaint:  Chief Complaint  Patient presents with   Depression   Follow-up   HPI: This patient is a 42 year old single white female who lives alone in [place]. 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   The patient returns for follow-up after 6 months.  She states that she continues to do well.  Her mood is stable and she denies significant depression low mood anhedonia or crying spells thoughts of self-harm or suicide.  She is sleeping well.  She states so far the school year is going well.  She is still trying to get weight loss medicine approved but it has been difficult. Visit Diagnosis:    ICD-10-CM   1. Moderate single current episode of major depressive disorder (HCC)  F32.1 venlafaxine XR (EFFEXOR-XR) 150 MG 24 hr capsule      Past Psychiatric History: none  Past Medical History:  Past Medical History:  Diagnosis Date   Acne    facial   Arthritis     Depression    Phreesia 05/24/2020   Migraines    Nicotine addiction    Obesity     Past Surgical History:  Procedure Laterality Date   CHOLECYSTECTOMY     COLONOSCOPY  10/2009   Dr. Jena Gauss; minimal anal canal hemorrhoids, otherwise normal rectum and colon.  Recommended repeat at age 70 due to family history.    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    Drug use: No    Comment: 04-02-2016 per pt no   Sexual activity: Not Currently    Birth control/protection: Pill  Other Topics Concern   Not on file  Social History Narrative   Lives at home alone.   Right-handed.   Occasional use of caffeine.   Social Determinants of Health   Financial Resource Strain: Low Risk  (08/02/2019)   Overall Financial Resource Strain (CARDIA)    Difficulty of Paying Living Expenses: Not very hard  Food Insecurity: Food Insecurity Present (08/02/2019)   Hunger Vital Sign    Worried About Running Out of Food in the Last Year: Sometimes true    Ran Out of Food  in the Last Year: Never true  Transportation Needs: No Transportation Needs (08/02/2019)   PRAPARE - Administrator, Civil Service (Medical): No    Lack of Transportation (Non-Medical): No  Physical Activity: Inactive (08/02/2019)   Exercise Vital Sign    Days of Exercise per Week: 0 days    Minutes of Exercise per Session: 0 min  Stress: Stress Concern Present (08/02/2019)   [employer] of Occupational Health - Occupational Stress Questionnaire    Feeling of Stress : Rather much  Social Connections: Socially Isolated (08/02/2019)   Social Connection and Isolation Panel [NHANES]    Frequency of Communication with Friends and Family: More than three times a week    Frequency of Social Gatherings with Friends and Family: Twice a week    Attends Religious Services: Never    Database administrator or Organizations: No    Attends Engineer, structural: Never    Marital Status: Never married    Allergies: No Known Allergies  Metabolic Disorder Labs: Lab Results  Component Value Date   HGBA1C 5.7 (H) 01/15/2022   MPG 105 11/30/2018   MPG 103 11/06/2016   No results found for: "PROLACTIN" Lab Results  Component Value Date   CHOL 212 (H) 01/15/2022   TRIG 170 (H) 01/15/2022   HDL 56 01/15/2022   CHOLHDL 3.8 01/15/2022   VLDL 31 (H) 11/06/2016   LDLCALC 126 (H) 01/15/2022   LDLCALC 132 (H) 04/27/2021   Lab Results  Component Value Date   TSH 2.150 01/15/2022   TSH 3.130 04/27/2021    Therapeutic Level Labs: No results found for: "LITHIUM" No results found for: "VALPROATE" No results found for: "CBMZ"  Current Medications: Current Outpatient Medications  Medication Sig Dispense Refill   Glucosamine HCl (GLUCOSAMINE PO) Take by mouth daily.     ibuprofen (ADVIL) 800 MG tablet TAKE 1 TABLET BY MOUTH TWICE DAILY AS NEEDED FOR HEADACHE (  MAX  USE  OF  TWICE  A  WEEK.) 30 tablet 0   loratadine (CLARITIN) 10 MG tablet TAKE ONE TABLET BY MOUTH ONCE DAILY 90  tablet 3   propranolol (INDERAL) 20 MG tablet Take 1 tablet by mouth twice daily 180 tablet 0   rizatriptan (MAXALT-MLT) 10 MG disintegrating tablet Take 1 tablet (10 mg total) by mouth as needed for migraine. May repeat in 2 hours if needed 10 tablet 2   Semaglutide-Weight Management 0.25 MG/0.5ML SOAJ Inject 0.25 mg into the skin once a week. 2 mL 0   TRI-SPRINTEC 0.18/0.215/0.25 MG-35 MCG tablet Take 1 tablet by mouth once daily 84 tablet 0   venlafaxine  XR (EFFEXOR-XR) 150 MG 24 hr capsule TAKE 2 CAPSULES BY MOUTH ONCE DAILY WITH BREAKFAST 180 capsule 2   vortioxetine HBr (TRINTELLIX) 10 MG TABS tablet Take 1 tablet (10 mg total) by mouth daily. 30 tablet 0   vortioxetine HBr (TRINTELLIX) 20 MG TABS tablet Take 1 tablet (20 mg total) by mouth daily. 90 tablet 2   No current facility-administered medications for this visit.     Musculoskeletal: Strength & Muscle Tone: within normal limits Gait & Station: normal Patient leans: N/A  Psychiatric Specialty Exam: Review of Systems  All other systems reviewed and are negative.   There were no vitals taken for this visit.There is no height or weight on file to calculate BMI.  General Appearance: Casual and Fairly Groomed  Eye Contact:  Good  Speech:  Clear and Coherent  Volume:  Normal  Mood:  Euthymic  Affect:  Congruent  Thought Process:  Goal Directed  Orientation:  Full (Time, Place, and Person)  Thought Content: WDL   Suicidal Thoughts:  No  Homicidal Thoughts:  No  Memory:  Immediate;   Good Recent;   Good Remote;   Fair  Judgement:  Good  Insight:  Good  Psychomotor Activity:  Normal  Concentration:  Concentration: Good and Attention Span: Good  Recall:  Good  Fund of Knowledge: Good  Language: Good  Akathisia:  No  Handed:  Right  AIMS (if indicated): not done  Assets:  Communication Skills Desire for Improvement Physical Health Resilience Social Support Talents/Skills Vocational/Educational  ADL's:  Intact   Cognition: WNL  Sleep:  Good   Screenings: GAD-7    Flowsheet Row Office Visit from 08/02/2019 in [facility]  Total GAD-7 Score 9      PHQ2-9    Flowsheet Row Office Visit from 01/08/2022 in [facility] Primary Care Video Visit from 07/16/2021 in [facility] at [place] Office Visit from 06/21/2021 in [facility] Primary Care Office Visit from 04/17/2021 in [facility] Office Visit from 04/02/2021 in [facility] Primary Care  PHQ-2 Total Score 1 0 2 1 1   PHQ-9 Total Score -- -- 4 1 1       Flowsheet Row Video Visit from 07/16/2021 in [facility] at [place] Video Visit from 07/26/2020 in [facility] at [place]  C-SSRS RISK CATEGORY No Risk No Risk        Assessment and Plan: This patient is a 42 year old female with a history of depression.  She continues to do well on her current regimen.  She will continue Trintellix 30 mg daily as well as Effexor XR 300 mg daily both for depression.  She will return to see me in 6 months  Collaboration of Care: Collaboration of Care: Primary Care Provider AEB notes are shared with PCP on the epic system  Patient/Guardian was advised Release of Information must be obtained prior to any record release in order to collaborate their care with an outside provider. Patient/Guardian was advised if they have not already done so to contact the registration department to sign all necessary forms in order for [place] to release information regarding their care.   Consent: Patient/Guardian gives verbal consent for treatment and assignment of benefits for services provided during this visit. Patient/Guardian expressed understanding and agreed to proceed.    Diannia Ruder, MD 01/01/2023, 4:10 PM

## 2023-01-06 ENCOUNTER — Encounter: Payer: Self-pay | Admitting: Family Medicine

## 2023-01-07 ENCOUNTER — Other Ambulatory Visit: Payer: Self-pay

## 2023-01-07 DIAGNOSIS — R102 Pelvic and perineal pain: Secondary | ICD-10-CM

## 2023-01-07 DIAGNOSIS — N926 Irregular menstruation, unspecified: Secondary | ICD-10-CM

## 2023-01-07 NOTE — Telephone Encounter (Signed)
Patient aware appointment moved up to 01/16/23 and urgent gyne referral placed

## 2023-01-08 ENCOUNTER — Ambulatory Visit
Admission: RE | Admit: 2023-01-08 | Discharge: 2023-01-08 | Disposition: A | Discharge status: Home / Self Care | Payer: BC Managed Care – PPO | Source: Ambulatory Visit | Attending: Family Medicine | Admitting: Family Medicine

## 2023-01-08 VITALS — BP 152/87 | HR 84 | Temp 98.4°F | Resp 18

## 2023-01-08 DIAGNOSIS — R102 Pelvic and perineal pain: Secondary | ICD-10-CM | POA: Diagnosis present

## 2023-01-08 DIAGNOSIS — N926 Irregular menstruation, unspecified: Secondary | ICD-10-CM | POA: Diagnosis present

## 2023-01-08 DIAGNOSIS — N390 Urinary tract infection, site not specified: Secondary | ICD-10-CM | POA: Diagnosis not present

## 2023-01-08 LAB — POCT URINALYSIS DIP (MANUAL ENTRY)
Bilirubin, UA: NEGATIVE
Glucose, UA: NEGATIVE mg/dL
Ketones, POC UA: NEGATIVE mg/dL
Nitrite, UA: POSITIVE — AB
Protein Ur, POC: NEGATIVE mg/dL
Spec Grav, UA: 1.025 (ref 1.010–1.025)
Urobilinogen, UA: 0.2 U/dL
pH, UA: 5.5 (ref 5.0–8.0)

## 2023-01-08 MED ORDER — CEPHALEXIN 500 MG PO CAPS: 500.0000 mg | ORAL_CAPSULE | Freq: Two times a day (BID) | ORAL | 0 refills | Status: DC

## 2023-01-08 NOTE — ED Provider Notes (Signed)
[facility]    CSN: [id number] Arrival date & time: 01/08/23  1548      History   Chief Complaint Chief Complaint  Patient presents with   Vaginal Bleeding    Been bleeding for almost 2 weeks, having sharp pain in lower abdomen like cramps but don't normally have them - Entered by patient    HPI Claire [name] is a 42 y.o. female.   Patient presenting today with light ongoing vaginal bleeding for about 2 weeks now since her menstrual cycle started.  States she started her period on August 31, spotting several days before this and has continued bleeding ever since.  Her typical menstrual cycles last about 5 days.  Denies new birth control, missed doses, new medication changes otherwise, lifestyle changes and denies fevers, chills, nausea, vomiting, urinary symptoms, vaginal symptoms, bowel changes.  She has had some intermittent right lower abdominal pains off-and-on for the past few days, no pain currently today.    Past Medical History:  Diagnosis Date   Acne    facial   Arthritis    Depression    [employer] 05/24/2020   Migraines    Nicotine addiction    Obesity     Patient Active Problem List   Diagnosis Date Noted   Periodic health assessment, Pap and pelvic 01/08/2022   Low back pain with left-sided sciatica 10/07/2020   Chronic hip pain, right 08/23/2020   Headache 03/21/2019   Family history of colon cancer 11/08/2017   Colon cancer screening 11/07/2016   Annual physical exam 12/05/2015   Chronic migraine 09/19/2015   Heel spur 03/10/2015   Depression, major, single episode, severe (HCC) 07/28/2014   Dyslipidemia, goal LDL below 100 07/11/2013   INTERNAL HEMORRHOIDS WITH OTHER COMPLICATION 10/04/2009   Morbid obesity (HCC) 09/23/2007    Past Surgical History:  Procedure Laterality Date   CHOLECYSTECTOMY     COLONOSCOPY  10/2009   Dr. Jena Gauss; minimal anal canal hemorrhoids, otherwise normal rectum and colon.  Recommended repeat at age 64 due to  family history.    OB History     Gravida  1   Para      Term      Preterm      AB  1   Living         SAB  1   IAB      Ectopic      Multiple      Live Births               Home Medications    Prior to Admission medications   Medication Sig Start Date End Date Taking? Authorizing Provider  cephALEXin (KEFLEX) 500 MG capsule Take 1 capsule (500 mg total) by mouth 2 (two) times daily. 01/08/23  Yes Particia Nearing, PA-C  Glucosamine HCl (GLUCOSAMINE PO) Take by mouth daily.    [provider]  ibuprofen (ADVIL) 800 MG tablet TAKE 1 TABLET BY MOUTH TWICE DAILY AS NEEDED FOR HEADACHE (  MAX  USE  OF  TWICE  A  WEEK.) 09/30/22   Kerri Perches, MD  loratadine (CLARITIN) 10 MG tablet TAKE ONE TABLET BY MOUTH ONCE DAILY 03/10/17   Kerri Perches, MD  propranolol (INDERAL) 20 MG tablet Take 1 tablet by mouth twice daily 11/11/22   Kerri Perches, MD  rizatriptan (MAXALT-MLT) 10 MG disintegrating tablet Take 1 tablet (10 mg total) by mouth as needed for migraine. May repeat in 2 hours  if needed 11/21/20   Kerri Perches, MD  TRI-SPRINTEC 0.18/0.215/0.25 MG-35 MCG tablet Take 1 tablet by mouth once daily 12/06/22   Kerri Perches, MD  venlafaxine XR (EFFEXOR-XR) 150 MG 24 hr capsule TAKE 2 CAPSULES BY MOUTH ONCE DAILY WITH BREAKFAST 01/01/23   Myrlene Broker, MD  vortioxetine HBr (TRINTELLIX) 10 MG TABS tablet Take 1 tablet (10 mg total) by mouth daily. 01/01/23   Myrlene Broker, MD  vortioxetine HBr (TRINTELLIX) 20 MG TABS tablet Take 1 tablet (20 mg total) by mouth daily. 01/01/23   Myrlene Broker, MD    Family History Family History  Problem Relation Age of Onset   Hypertension Mother    Cancer Mother 68       colon   Obesity Mother    Hypertension Father    Diabetes Father    Hyperlipidemia Father    Obesity Father     Social History Social History   Tobacco Use   Smoking status: Former    Current packs/day: 0.00     Types: Cigarettes    Quit date: 11/13/2008    Years since quitting: 14.1   Smokeless tobacco: Never   Tobacco comments:    Quit 2008, 04-02-2016 per pt stopped 2010  Vaping Use   Vaping status: Never Used  Substance Use Topics   Alcohol use: No    Alcohol/week: 0.0 standard drinks of alcohol    Comment: occasional use  a beer once every 3 months or so, 04-02-2016 per pt occas.    Drug use: No    Comment: 04-02-2016 per pt no     Allergies   Patient has no known allergies.   Review of Systems Review of Systems Per HPI  Physical Exam Triage Vital Signs ED Triage Vitals  Encounter Vitals Group     BP 01/08/23 1555 (!) 152/87     Systolic BP Percentile --      Diastolic BP Percentile --      Pulse Rate 01/08/23 1555 84     Resp 01/08/23 1555 18     Temp 01/08/23 1555 98.4 F (36.9 C)     Temp Source 01/08/23 1555 Oral     SpO2 01/08/23 1555 97 %     Weight --      Height --      Head Circumference --      Peak Flow --      Pain Score 01/08/23 1557 0     Pain Loc --      Pain Education --      Exclude from Growth Chart --    No data found.  Updated Vital Signs BP (!) 152/87 (BP Location: Right Arm)   Pulse 84   Temp 98.4 F (36.9 C) (Oral)   Resp 18   LMP 12/28/2022 (Exact Date)   SpO2 97%   Visual Acuity Right Eye Distance:   Left Eye Distance:   Bilateral Distance:    Right Eye Near:   Left Eye Near:    Bilateral Near:     Physical Exam Vitals and nursing note reviewed.  Constitutional:      Appearance: Normal appearance. She is not ill-appearing.  HENT:     Head: Atraumatic.     Mouth/Throat:     Mouth: Mucous membranes are moist.     Pharynx: Oropharynx is clear.  Eyes:     Extraocular Movements: Extraocular movements intact.     Conjunctiva/sclera: Conjunctivae normal.  Cardiovascular:  Rate and Rhythm: Normal rate and regular rhythm.     Heart sounds: Normal heart sounds.  Pulmonary:     Effort: Pulmonary effort is normal.     Breath  sounds: Normal breath sounds.  Abdominal:     General: Bowel sounds are normal. There is no distension.     Palpations: Abdomen is soft.     Tenderness: There is no abdominal tenderness. There is no right CVA tenderness, left CVA tenderness or guarding.  Genitourinary:    Comments: GU exam deferred to GYN Musculoskeletal:        General: Normal range of motion.     Cervical back: Normal range of motion and neck supple.  Skin:    General: Skin is warm and dry.  Neurological:     Mental Status: She is alert and oriented to person, place, and time.  Psychiatric:        Mood and Affect: Mood normal.        Thought Content: Thought content normal.        Judgment: Judgment normal.      UC Treatments / Results  Labs (all labs ordered are listed, but only abnormal results are displayed) Labs Reviewed  POCT URINALYSIS DIP (MANUAL ENTRY) - Abnormal; Notable for the following components:      Result Value   Blood, UA moderate (*)    Nitrite, UA Positive (*)    Leukocytes, UA Trace (*)    All other components within normal limits  URINE CULTURE    EKG   Radiology No results found.  Procedures Procedures (including critical care time)  Medications Ordered in UC Medications - No data to display  Initial Impression / Assessment and Plan / UC Course  I have reviewed the triage vital signs and the nursing notes.  Pertinent labs & imaging results that were available during my care of the patient were reviewed by me and considered in my medical decision making (see chart for details).     Mildly hypertensive in triage, otherwise vital signs reassuring.  She is in no acute distress.  Abdomen nontender, no red flag findings today.  Urinalysis positive for potential urinary tract infection, urine culture pending, treat with Keflex while awaiting remainder of results.  Suspect this is more incidental though could be related to her lower abdominal pain.  Regarding her ongoing menstrual  bleeding, has OB/GYN follow-up next week, bleeding is not significantly heavy so we will forego any prescription interventions at this time and continue to monitor until OB/GYN follow-up.  Return for worsening symptoms at any time.  Final Clinical Impressions(s) / UC Diagnoses   Final diagnoses:  Acute lower UTI  Abnormal menstruation  Pelvic pain   Discharge Instructions   None    ED Prescriptions     Medication Sig Dispense Auth. Provider   cephALEXin (KEFLEX) 500 MG capsule Take 1 capsule (500 mg total) by mouth 2 (two) times daily. 10 capsule Particia Nearing, [place]      PDMP not reviewed this encounter.   Particia Nearing, [place] 01/08/23 1839

## 2023-01-08 NOTE — ED Triage Notes (Signed)
Vaginal bleeding since August 31st.  States using 2 pads a day.  Having sharp pain on right lower ABD at times.

## 2023-01-11 ENCOUNTER — Telehealth: Payer: Self-pay

## 2023-01-11 LAB — URINE CULTURE: Culture: >=100000 — AB

## 2023-01-11 MED ORDER — CIPROFLOXACIN HCL 250 MG PO TABS
250.0000 mg | ORAL_TABLET | Freq: Two times a day (BID) | ORAL | 0 refills | Status: DC
Start: 2023-01-11 — End: 2023-01-16

## 2023-01-11 NOTE — Telephone Encounter (Signed)
Per VO by L. Lequita Halt, PA-C, "Cipro 250mg  BID x5 days." Pt to d/c Kelfex. Reviewed with patient, verified pharmacy, prescription sent.

## 2023-01-16 ENCOUNTER — Ambulatory Visit: Payer: BC Managed Care – PPO | Admitting: Family Medicine

## 2023-01-16 VITALS — BP 144/84 | HR 82 | Ht 63.0 in | Wt 378.0 lb

## 2023-01-16 DIAGNOSIS — Z1211 Encounter for screening for malignant neoplasm of colon: Secondary | ICD-10-CM

## 2023-01-16 DIAGNOSIS — E8881 Metabolic syndrome: Secondary | ICD-10-CM

## 2023-01-16 DIAGNOSIS — R7301 Impaired fasting glucose: Secondary | ICD-10-CM

## 2023-01-16 DIAGNOSIS — Z23 Encounter for immunization: Secondary | ICD-10-CM | POA: Diagnosis not present

## 2023-01-16 DIAGNOSIS — R7303 Prediabetes: Secondary | ICD-10-CM | POA: Diagnosis not present

## 2023-01-16 DIAGNOSIS — I10 Essential (primary) hypertension: Secondary | ICD-10-CM

## 2023-01-16 DIAGNOSIS — Z800 Family history of malignant neoplasm of digestive organs: Secondary | ICD-10-CM

## 2023-01-16 DIAGNOSIS — Z6841 Body Mass Index (BMI) 40.0 and over, adult: Secondary | ICD-10-CM

## 2023-01-16 DIAGNOSIS — E559 Vitamin D deficiency, unspecified: Secondary | ICD-10-CM

## 2023-01-16 DIAGNOSIS — E785 Hyperlipidemia, unspecified: Secondary | ICD-10-CM

## 2023-01-16 DIAGNOSIS — L0231 Cutaneous abscess of buttock: Secondary | ICD-10-CM

## 2023-01-16 DIAGNOSIS — F322 Major depressive disorder, single episode, severe without psychotic features: Secondary | ICD-10-CM

## 2023-01-16 DIAGNOSIS — Z1231 Encounter for screening mammogram for malignant neoplasm of breast: Secondary | ICD-10-CM

## 2023-01-16 MED ORDER — DOXYCYCLINE HYCLATE 100 MG PO TABS: 100.0000 mg | ORAL_TABLET | Freq: Two times a day (BID) | ORAL | 0 refills | Status: DC

## 2023-01-16 MED ORDER — RIZATRIPTAN BENZOATE 10 MG PO TBDP: 10.0000 mg | ORAL_TABLET | ORAL | 2 refills | Status: AC | PRN

## 2023-01-16 MED ORDER — FLUCONAZOLE 150 MG PO TABS: ORAL_TABLET | ORAL | 0 refills | Status: DC

## 2023-01-16 MED ORDER — SPIRONOLACTONE 25 MG PO TABS: 25.0000 mg | ORAL_TABLET | Freq: Every day | ORAL | 3 refills | Status: DC

## 2023-01-16 NOTE — Progress Notes (Signed)
Claire [name]     MRN: [id number]      DOB: 11/23/1980  Chief Complaint  Patient presents with   Follow-up    Bleeding x 3 weeks seen at urgent care x 2 weeks ago     HPI Claire [name] is here for follow up and re-evaluation of chronic medical conditions, medication management and review of any available recent lab and radiology data.  Preventive health is updated, specifically  Cancer screening and Immunization.   Questions or concerns regarding consultations or procedures which the PT has had in the interim are  addressed. The PT denies any adverse reactions to current medications since the last visit.   3 week h/o bleeding continually , strings, clots, inermittent lower abdominal cramping pain, no odor or d/c, no fever or chills  Was treated at [facility] 2 weeks ago for UTI 5 day h/o painful sore on buttock, unaware of insect bite, but seems like one Needs mammogram Needs colonoscopy due to f/h of colon cancer in Mother in her early 59's   ROS Denies recent fever or chills. Denies sinus pressure, nasal congestion, ear pain or sore throat. Denies chest congestion, productive cough or wheezing. Denies chest pains, palpitations and leg swelling Denies abdominal pain, nausea, vomiting,diarrhea or constipation.   Denies dysuria, frequency, hesitancy or incontinence. Denies joint pain, swelling and limitation in mobility. Denies  uncontrolled headaches, seizures, numbness, or tingling. C/o  depression, anxiety or insomnia.  PE  BP (!) 144/84   Pulse 82   Ht 5\' 3"  (1.6 m)   Wt (!) 378 lb 0.6 oz (171.5 kg)   LMP 12/28/2022 (Exact Date)   SpO2 96%   BMI 66.97 kg/m    Patient alert and oriented and in no cardiopulmonary distress.  HEENT: No facial asymmetry, EOMI,     Neck supple .  Chest: Clear to auscultation bilaterally.  CVS: S1, S2 no murmurs, no S3.Regular rate.  Ext: No edema  MS: Adequate ROM spine, shoulders, hips and knees.  Skin: abscess right buttock Psych: Good eye  contact, normal affect. Memory intact not anxious or depressed appearing.  CNS: CN 2-12 intact, power,  normal throughout.no focal deficits noted.   Assessment & Plan  Essential hypertension Start spironolactone 25 mg daily DASH diet and commitment to daily physical activity for a minimum of 30 minutes discussed and encouraged, as a part of hypertension management. The importance of attaining a healthy weight is also discussed.     01/16/2023    3:10 PM 01/16/2023    3:09 PM 01/16/2023    2:08 PM 01/08/2023    3:55 PM 01/08/2022    4:17 PM 08/27/2021    1:50 PM 06/21/2021    3:48 PM  BP/Weight  Systolic BP 144 142 125 152 122 136 124  Diastolic BP 84 88 86 87 79 72 84  Wt. (Lbs)   378.04  346.12 335.6 342  BMI   66.97 kg/m2  61.31 kg/m2 59.45 kg/m2 60.58 kg/m2       Morbid obesity with BMI of 60.0-69.9, adult (HCC) Ongoing weight increase , needs surgical intervention for best success, unable to get medications to assist with weight loss  Patient re-educated about  the importance of commitment to a  minimum of 150 minutes of exercise per week as able.  The importance of healthy food choices with portion control discussed, as well as eating regularly and within a 12 hour window most days. The need to choose "clean , green" food 50  to 75% of the time is discussed, as well as to make water the primary drink and set a goal of 64 ounces water daily.       01/16/2023    2:08 PM 01/08/2022    4:17 PM 08/27/2021    1:50 PM  Weight /BMI  Weight 378 lb 0.6 oz 346 lb 1.9 oz 335 lb 9.6 oz  Height 5\' 3"  (1.6 m) 5\' 3"  (1.6 m) 5\' 3"  (1.6 m)  BMI 66.97 kg/m2 61.31 kg/m2 59.45 kg/m2      Dyslipidemia, goal LDL below 100 Hyperlipidemia:Low fat diet discussed and encouraged.   Lipid Panel  Lab Results  Component Value Date   CHOL 235 (H) 01/16/2023   HDL 58 01/16/2023   LDLCALC 134 (H) 01/16/2023   TRIG 243 (H) 01/16/2023   CHOLHDL 4.1 01/16/2023     Neds to change diet, crestor  started low dose, re eval in 4 months  Family history of colon cancer Refer for screening colonoscopy  Chronic migraine Controlled, no change in medication   Prediabetes Patient educated about the importance of limiting  Carbohydrate intake , the need to commit to daily physical activity for a minimum of 30 minutes , and to commit weight loss. The fact that changes in all these areas will reduce or eliminate all together the development of diabetes is stressed.  Worsened     Latest Ref Rng & Units 01/16/2023    3:26 PM 01/15/2022    8:15 AM 04/27/2021   10:33 AM 08/23/2020    3:19 PM 11/30/2018   10:58 AM  Diabetic Labs  HbA1c 4.8 - 5.6 % 5.8  5.7  5.5  5.7  5.3   Chol 100 - 199 mg/dL 865  784  696  295  284   HDL >39 mg/dL 58  56  57  60  57   Calc LDL 0 - 99 mg/dL 132  440  102  725  366   Triglycerides 0 - 149 mg/dL 440  347  425  956  387   Creatinine 0.57 - 1.00 mg/dL 5.64  3.32  9.51  8.84  0.75       01/16/2023    3:10 PM 01/16/2023    3:09 PM 01/16/2023    2:08 PM 01/08/2023    3:55 PM 01/08/2022    4:17 PM 08/27/2021    1:50 PM 06/21/2021    3:48 PM  BP/Weight  Systolic BP 144 142 125 152 122 136 124  Diastolic BP 84 88 86 87 79 72 84  Wt. (Lbs)   378.04  346.12 335.6 342  BMI   66.97 kg/m2  61.31 kg/m2 59.45 kg/m2 60.58 kg/m2       No data to display            Metabolic syndrome X The increased risk of cardiovascular disease associated with this diagnosis, and the need to consistently work on lifestyle to change this is discussed. Following  a  heart healthy diet ,commitment to 30 minutes of exercise at least 5 days per week, as well as control of blood sugar and cholesterol , and achieving a healthy weight are all the areas to be addressed .   Severe major depression without psychotic features (HCC) Treated by Psych, not suicidal or homicidal  Abscess of right buttock Doxycycline prescroibed

## 2023-01-16 NOTE — Patient Instructions (Addendum)
F/u in 8 weeks re evaluate blood pressure call if you need m sooner  Doxycycline prescribed for boil, and fluconazole if needed , for yeast infection  Flu vaccine today  CBC, TSH, Vit d, lipid, cmp and eGFr, hBA1C today  Blood pressure is high, start spironolactone one daily  Nurse pls refill x 3 maxalt and metoprolol  Please schedule mammogram at checkout  You are referred to GI for colonoscopy  Nurse pls give info to start the  process of weight loss surgery  Thanks for choosing [facility], we consider it a privelige to serve you.

## 2023-01-17 ENCOUNTER — Encounter: Payer: Self-pay | Admitting: Family Medicine

## 2023-01-17 LAB — CMP14+EGFR
ALT: 9 IU/L (ref 0–32)
AST: 9 IU/L (ref 0–40)
Albumin: 4.1 g/dL (ref 3.9–4.9)
Alkaline Phosphatase: 86 IU/L (ref 44–121)
BUN/Creatinine Ratio: 18 (ref 9–23)
BUN: 14 mg/dL (ref 6–24)
Bilirubin Total: <0.2 mg/dL (ref 0.0–1.2)
CO2: 23 mmol/L (ref 20–29)
Calcium: 9.7 mg/dL (ref 8.7–10.2)
Chloride: 101 mmol/L (ref 96–106)
Creatinine, Ser: 0.78 mg/dL (ref 0.57–1.00)
Globulin, Total: 2.8 g/dL (ref 1.5–4.5)
Glucose: 97 mg/dL (ref 70–99)
Potassium: 4.7 mmol/L (ref 3.5–5.2)
Sodium: 140 mmol/L (ref 134–144)
Total Protein: 6.9 g/dL (ref 6.0–8.5)
eGFR: 98 mL/min/{1.73_m2} (ref >59)

## 2023-01-17 LAB — HEMOGLOBIN A1C
Est. average glucose Bld gHb Est-mCnc: 120 mg/dL
Hgb A1c MFr Bld: 5.8 % — ABNORMAL HIGH (ref 4.8–5.6)

## 2023-01-17 LAB — LIPID PANEL
Chol/HDL Ratio: 4.1 ratio (ref 0.0–4.4)
Cholesterol, Total: 235 mg/dL — ABNORMAL HIGH (ref 100–199)
HDL: 58 mg/dL (ref >39)
LDL Chol Calc (NIH): 134 mg/dL — ABNORMAL HIGH (ref 0–99)
Triglycerides: 243 mg/dL — ABNORMAL HIGH (ref 0–149)
VLDL Cholesterol Cal: 43 mg/dL — ABNORMAL HIGH (ref 5–40)

## 2023-01-17 LAB — CBC
Hematocrit: 39 % (ref 34.0–46.6)
Hemoglobin: 12.3 g/dL (ref 11.1–15.9)
MCH: 28.5 pg (ref 26.6–33.0)
MCHC: 31.5 g/dL (ref 31.5–35.7)
MCV: 91 fL (ref 79–97)
Platelets: 388 10*3/uL (ref 150–450)
RBC: 4.31 x10E6/uL (ref 3.77–5.28)
RDW: 13.1 % (ref 11.7–15.4)
WBC: 6.6 10*3/uL (ref 3.4–10.8)

## 2023-01-17 LAB — VITAMIN D 25 HYDROXY (VIT D DEFICIENCY, FRACTURES): Vit D, 25-Hydroxy: 42.4 ng/mL (ref 30.0–100.0)

## 2023-01-17 LAB — TSH: TSH: 1.78 u[IU]/mL (ref 0.450–4.500)

## 2023-01-17 MED ORDER — ROSUVASTATIN CALCIUM 5 MG PO TABS: 5.0000 mg | ORAL_TABLET | Freq: Every day | ORAL | 5 refills | Status: DC

## 2023-01-20 ENCOUNTER — Ambulatory Visit: Payer: BC Managed Care – PPO | Admitting: Adult Health

## 2023-01-20 ENCOUNTER — Encounter: Payer: Self-pay | Admitting: Adult Health

## 2023-01-20 ENCOUNTER — Encounter: Payer: Self-pay | Admitting: *Deleted

## 2023-01-20 ENCOUNTER — Encounter: Payer: Self-pay | Admitting: Family Medicine

## 2023-01-20 VITALS — BP 147/93 | HR 87 | Ht 63.0 in | Wt 376.6 lb

## 2023-01-20 DIAGNOSIS — R102 Pelvic and perineal pain: Secondary | ICD-10-CM

## 2023-01-20 DIAGNOSIS — L0231 Cutaneous abscess of buttock: Secondary | ICD-10-CM | POA: Insufficient documentation

## 2023-01-20 DIAGNOSIS — I10 Essential (primary) hypertension: Secondary | ICD-10-CM | POA: Diagnosis not present

## 2023-01-20 DIAGNOSIS — N926 Irregular menstruation, unspecified: Secondary | ICD-10-CM | POA: Insufficient documentation

## 2023-01-20 DIAGNOSIS — R7303 Prediabetes: Secondary | ICD-10-CM | POA: Insufficient documentation

## 2023-01-20 DIAGNOSIS — E8881 Metabolic syndrome: Secondary | ICD-10-CM | POA: Insufficient documentation

## 2023-01-20 MED ORDER — LO LOESTRIN FE 1 MG-10 MCG / 10 MCG PO TABS: 1.0000 | ORAL_TABLET | Freq: Every day | ORAL | Status: DC

## 2023-01-20 NOTE — Assessment & Plan Note (Signed)
Hyperlipidemia:Low fat diet discussed and encouraged.   Lipid Panel  Lab Results  Component Value Date   CHOL 235 (H) 01/16/2023   HDL 58 01/16/2023   LDLCALC 134 (H) 01/16/2023   TRIG 243 (H) 01/16/2023   CHOLHDL 4.1 01/16/2023     Neds to change diet, crestor started low dose, re eval in 4 months

## 2023-01-20 NOTE — Assessment & Plan Note (Signed)
Ongoing weight increase , needs surgical intervention for best success, unable to get medications to assist with weight loss  Patient re-educated about  the importance of commitment to a  minimum of 150 minutes of exercise per week as able.  The importance of healthy food choices with portion control discussed, as well as eating regularly and within a 12 hour window most days. The need to choose "clean , green" food 50 to 75% of the time is discussed, as well as to make water the primary drink and set a goal of 64 ounces water daily.       01/16/2023    2:08 PM 01/08/2022    4:17 PM 08/27/2021    1:50 PM  Weight /BMI  Weight 378 lb 0.6 oz 346 lb 1.9 oz 335 lb 9.6 oz  Height 5\' 3"  (1.6 m) 5\' 3"  (1.6 m) 5\' 3"  (1.6 m)  BMI 66.97 kg/m2 61.31 kg/m2 59.45 kg/m2

## 2023-01-20 NOTE — Assessment & Plan Note (Signed)
Treated by Psych, not suicidal or homicidal ?

## 2023-01-20 NOTE — Progress Notes (Signed)
Subjective:     Patient ID: TONNISHA NATTRESS, female   DOB: 1980/12/22, 42 y.o.   MRN: [id number]  HPI Lilyrose is a 42 year old white female, with SO, G1P0010, in complaining of irregular bleeding on tri sprintec, and pelvic pain on and off, esp right side, it is sharp.  Last pap was negative HPV, NILM 01/08/22.  PCP is Dr Lodema Hong  Review of Systems +irregular bleeding +pelvic pain Not having sex Has migraines, without aura Reviewed past medical,surgical, social and family history. Reviewed medications and allergies.     Objective:   Physical Exam BP (!) 147/93 (BP Location: Right Arm, Patient Position: Sitting, Cuff Size: Normal)   Pulse 87   Ht 5\' 3"  (1.6 m)   Wt (!) 376 lb 9.6 oz (170.8 kg)   LMP 12/28/2022 (Exact Date)   BMI 66.71 kg/m     Skin warm and dry.Pelvic: external genitalia is normal in appearance no lesions, vagina: +dark blood, no odor,urethra has no lesions or masses noted, cervix:smooth, uterus: normal size, shape and contour, non tender, no masses felt, adnexa: no masses or tenderness noted. Bladder is non tender and no masses felt.  AA is 0 Fall risk is low    01/20/2023    3:00 PM 01/16/2023    2:10 PM 01/08/2022    4:17 PM  Depression screen PHQ 2/9  Decreased Interest 2 1 0  Down, Depressed, Hopeless 1 1 1   PHQ - 2 Score 3 2 1   Altered sleeping 2 2   Tired, decreased energy 2 3   Change in appetite 2 3   Feeling bad or failure about yourself  1 2   Trouble concentrating 1 1   Moving slowly or fidgety/restless 0 0   Suicidal thoughts 0 0   PHQ-9 Score 11 13   Difficult doing work/chores  Somewhat difficult    She is on meds    01/20/2023    3:00 PM 01/16/2023    2:10 PM 08/02/2019    3:01 PM  GAD 7 : Generalized Anxiety Score  Nervous, Anxious, on Edge 1 1 2   Control/stop worrying 0 0 2  Worry too much - different things 1 1 2   Trouble relaxing 1 1 1   Restless 0 0 0  Easily annoyed or irritable 1 1 1   Afraid - awful might happen 0 0 1  Total GAD  7 Score 4 4 9   Anxiety Difficulty  Somewhat difficult     Upstream - 01/20/23 1507       Pregnancy Intention Screening   Does the patient want to become pregnant in the next year? No    Does the patient's partner want to become pregnant in the next year? No    Would the patient like to discuss contraceptive options today? No      Contraception Wrap Up   Current Method Oral Contraceptive    End Method Oral Contraceptive    Contraception Counseling Provided Yes            Examination chaperoned by Malachy Mood LPN    Assessment:     1. Pelvic pain + pelvic pian on and off, right side the most and can be sharp Will get [place] to assess uterus and ovaries 01/29/23, will talk when results back  - US PELVIC COMPLETE WITH TRANSVAGINAL; Future  2. Irregular bleeding +irregular bleeding, started spotting 8/28 and then periods started 12/28/22 and still bleeding, no heavy Will get [place] to assess uterus 01/29/23 in  office Stop current BCP and start lo Loestrin, 3 packs given  - US PELVIC COMPLETE WITH TRANSVAGINAL; Future  3. Essential hypertension Has just started BP meds, take daily  Follow up with PCP    Plan:     Follow up with me in 4 weeks for ROS

## 2023-01-20 NOTE — Assessment & Plan Note (Signed)
The increased risk of cardiovascular disease associated with this diagnosis, and the need to consistently work on lifestyle to change this is discussed. Following  a  heart healthy diet ,commitment to 30 minutes of exercise at least 5 days per week, as well as control of blood sugar and cholesterol , and achieving a healthy weight are all the areas to be addressed .

## 2023-01-20 NOTE — Assessment & Plan Note (Signed)
Start spironolactone 25 mg daily DASH diet and commitment to daily physical activity for a minimum of 30 minutes discussed and encouraged, as a part of hypertension management. The importance of attaining a healthy weight is also discussed.     01/16/2023    3:10 PM 01/16/2023    3:09 PM 01/16/2023    2:08 PM 01/08/2023    3:55 PM 01/08/2022    4:17 PM 08/27/2021    1:50 PM 06/21/2021    3:48 PM  BP/Weight  Systolic BP 144 142 125 152 122 136 124  Diastolic BP 84 88 86 87 79 72 84  Wt. (Lbs)   378.04  346.12 335.6 342  BMI   66.97 kg/m2  61.31 kg/m2 59.45 kg/m2 60.58 kg/m2

## 2023-01-20 NOTE — Assessment & Plan Note (Signed)
Controlled, no change in medication  

## 2023-01-20 NOTE — Assessment & Plan Note (Signed)
Patient educated about the importance of limiting  Carbohydrate intake , the need to commit to daily physical activity for a minimum of 30 minutes , and to commit weight loss. The fact that changes in all these areas will reduce or eliminate all together the development of diabetes is stressed.  Worsened     Latest Ref Rng & Units 01/16/2023    3:26 PM 01/15/2022    8:15 AM 04/27/2021   10:33 AM 08/23/2020    3:19 PM 11/30/2018   10:58 AM  Diabetic Labs  HbA1c 4.8 - 5.6 % 5.8  5.7  5.5  5.7  5.3   Chol 100 - 199 mg/dL 034  742  595  638  756   HDL >39 mg/dL 58  56  57  60  57   Calc LDL 0 - 99 mg/dL 433  295  188  416  606   Triglycerides 0 - 149 mg/dL 301  601  093  235  573   Creatinine 0.57 - 1.00 mg/dL 2.20  2.54  2.70  6.23  0.75       01/16/2023    3:10 PM 01/16/2023    3:09 PM 01/16/2023    2:08 PM 01/08/2023    3:55 PM 01/08/2022    4:17 PM 08/27/2021    1:50 PM 06/21/2021    3:48 PM  BP/Weight  Systolic BP 144 142 125 152 122 136 124  Diastolic BP 84 88 86 87 79 72 84  Wt. (Lbs)   378.04  346.12 335.6 342  BMI   66.97 kg/m2  61.31 kg/m2 59.45 kg/m2 60.58 kg/m2       No data to display

## 2023-01-20 NOTE — Assessment & Plan Note (Signed)
Refer for screening colonoscopy

## 2023-01-20 NOTE — Assessment & Plan Note (Signed)
Doxycycline prescroibed

## 2023-01-27 ENCOUNTER — Ambulatory Visit (HOSPITAL_COMMUNITY)
Admission: RE | Admit: 2023-01-27 | Discharge: 2023-01-27 | Disposition: A | Discharge status: Home / Self Care | Payer: BC Managed Care – PPO | Source: Ambulatory Visit | Attending: Family Medicine | Admitting: Family Medicine

## 2023-01-27 ENCOUNTER — Encounter (HOSPITAL_COMMUNITY): Payer: Self-pay

## 2023-01-27 DIAGNOSIS — Z1231 Encounter for screening mammogram for malignant neoplasm of breast: Secondary | ICD-10-CM | POA: Diagnosis present

## 2023-01-28 ENCOUNTER — Ambulatory Visit: Payer: BC Managed Care – PPO | Admitting: Family Medicine

## 2023-01-29 ENCOUNTER — Ambulatory Visit (INDEPENDENT_AMBULATORY_CARE_PROVIDER_SITE_OTHER): Payer: BC Managed Care – PPO

## 2023-01-29 DIAGNOSIS — R102 Pelvic and perineal pain: Secondary | ICD-10-CM

## 2023-01-29 DIAGNOSIS — N926 Irregular menstruation, unspecified: Secondary | ICD-10-CM

## 2023-01-29 NOTE — Progress Notes (Signed)
PELVIC US TA/TV: heterogeneous retroverted uterus with multiple fibroids,(#1) anterior subserosal fibroid 3.1 x 2.7 x 2.9 cm,(#2) fundal subserosal fibroid 2.4 x 1.9 x 2.3 cm,EEC 9 mm,simple left ovarian cyst 2.5 x 2.1 x 2.3 cm,normal right ovary,ovaries appear mobile,no free fluid,no pain during ultrasound  Chaperone [facility]

## 2023-02-04 ENCOUNTER — Ambulatory Visit: Payer: BC Managed Care – PPO | Admitting: Family Medicine

## 2023-02-05 ENCOUNTER — Other Ambulatory Visit (HOSPITAL_COMMUNITY): Payer: Self-pay | Admitting: Psychiatry

## 2023-02-11 ENCOUNTER — Other Ambulatory Visit: Payer: Self-pay | Admitting: Family Medicine

## 2023-02-17 ENCOUNTER — Ambulatory Visit: Payer: BC Managed Care – PPO | Admitting: Adult Health

## 2023-02-24 ENCOUNTER — Ambulatory Visit: Payer: BC Managed Care – PPO | Admitting: Adult Health

## 2023-02-24 ENCOUNTER — Encounter: Payer: Self-pay | Admitting: Adult Health

## 2023-02-24 VITALS — BP 127/78 | HR 69 | Ht 63.0 in | Wt 378.0 lb

## 2023-02-24 DIAGNOSIS — Z3041 Encounter for surveillance of contraceptive pills: Secondary | ICD-10-CM | POA: Diagnosis not present

## 2023-02-24 DIAGNOSIS — N926 Irregular menstruation, unspecified: Secondary | ICD-10-CM | POA: Diagnosis not present

## 2023-02-24 DIAGNOSIS — R102 Pelvic and perineal pain: Secondary | ICD-10-CM

## 2023-02-24 MED ORDER — LO LOESTRIN FE 1 MG-10 MCG / 10 MCG PO TABS: 1.0000 | ORAL_TABLET | Freq: Every day | ORAL | 3 refills | Status: DC

## 2023-02-24 NOTE — Progress Notes (Signed)
  Subjective:     Patient ID: Claire [name], female   DOB: 1980/05/24, 42 y.o.   MRN: [id number]  HPI Claire [name] is a 42 year old white female with So, G1Poo10 back in follow up on bleeding and pain. Bleeding stopped in about 2 days of starting lo Loestrin and pain has stopped, too.     Component Value Date/Time   DIAGPAP  01/08/2022 1609    - Negative for intraepithelial lesion or malignancy (NILM)   DIAGPAP  12/02/2018 0000    NEGATIVE FOR INTRAEPITHELIAL LESIONS OR MALIGNANCY.   DIAGPAP SHIFT IN FLORA SUGGESTIVE OF BACTERIAL VAGINOSIS. 12/02/2018 0000   HPVHIGH Negative 01/08/2022 1609   ADEQPAP  01/08/2022 1609    Satisfactory for evaluation; transformation zone component ABSENT.   ADEQPAP  12/02/2018 0000    Satisfactory for evaluation  endocervical/transformation zone component ABSENT.   PCP is Dr Lodema Hong.  Review of Systems Bleeding and pain has stopped Reviewed past medical,surgical, social and family history. Reviewed medications and allergies.     Objective:   Physical Exam BP 127/78 (BP Location: Left Arm, Patient Position: Sitting, Cuff Size: Normal)   Pulse 69   Ht 5\' 3"  (1.6 m)   Wt (!) 378 lb (171.5 kg)   LMP 02/15/2023   BMI 66.96 kg/m     Skin warm and dry. Lungs: clear to ausculation bilaterally. Cardiovascular: regular rate and rhythm.   Upstream - 02/24/23 1512       Pregnancy Intention Screening   Does the patient want to become pregnant in the next year? No    Does the patient's partner want to become pregnant in the next year? No    Would the patient like to discuss contraceptive options today? No      Contraception Wrap Up   Current Method Oral Contraceptive    End Method Oral Contraceptive    Contraception Counseling Provided Yes             Assessment:     1. Pelvic pain, has resolved  2. Irregular bleeding Bleeding has stopped  3. Encounter for surveillance of contraceptive pills Will refill lo Loestrin, I gave 3 packs and discount  card  Meds ordered this encounter  Medications   Norethindrone-Ethinyl Estradiol-Fe Biphas (LO LOESTRIN FE) 1 MG-10 MCG / 10 MCG tablet    Sig: Take 1 tablet by mouth daily. Take 1 daily by mouth    Dispense:  84 tablet    Refill:  3    BIN [id number], PCN [id number], GRP [id number]    Order Specific Question:   Supervising Provider    Answer:   Lazaro Arms [[id number]]       Plan:     Follow up in 3 months of sooner if needed

## 2023-03-05 ENCOUNTER — Other Ambulatory Visit: Payer: Self-pay | Admitting: Family Medicine

## 2023-03-14 ENCOUNTER — Encounter: Payer: Self-pay | Admitting: Family Medicine

## 2023-03-14 ENCOUNTER — Ambulatory Visit: Payer: BC Managed Care – PPO | Admitting: Family Medicine

## 2023-03-14 VITALS — BP 123/85 | HR 72 | Ht 63.0 in | Wt 371.1 lb

## 2023-03-14 DIAGNOSIS — Z6841 Body Mass Index (BMI) 40.0 and over, adult: Secondary | ICD-10-CM

## 2023-03-14 DIAGNOSIS — F322 Major depressive disorder, single episode, severe without psychotic features: Secondary | ICD-10-CM

## 2023-03-14 DIAGNOSIS — R7303 Prediabetes: Secondary | ICD-10-CM | POA: Diagnosis not present

## 2023-03-14 DIAGNOSIS — I10 Essential (primary) hypertension: Secondary | ICD-10-CM | POA: Diagnosis not present

## 2023-03-14 DIAGNOSIS — E785 Hyperlipidemia, unspecified: Secondary | ICD-10-CM | POA: Diagnosis not present

## 2023-03-14 MED ORDER — PROPRANOLOL HCL 20 MG PO TABS: 20.0000 mg | ORAL_TABLET | Freq: Two times a day (BID) | ORAL | 1 refills | Status: DC

## 2023-03-14 MED ORDER — SPIRONOLACTONE 25 MG PO TABS: 25.0000 mg | ORAL_TABLET | Freq: Every day | ORAL | 3 refills | Status: DC

## 2023-03-14 NOTE — Patient Instructions (Addendum)
F/U in 18 weeks, call if you need me sooner, cqlll for labs about 1 month before  Fasting lipid and hepatic panel, cmp and eGFR Dec 20 or after  Pls follow up with colonoscopy  Keep u p the great work!  Thankful much better!  Thanks for choosing [facility], we consider it a privelige to serve you.

## 2023-03-14 NOTE — Assessment & Plan Note (Signed)
Patient educated about the importance of limiting  Carbohydrate intake , the need to commit to daily physical activity for a minimum of 30 minutes , and to commit weight loss. The fact that changes in all these areas will reduce or eliminate all together the development of diabetes is stressed.      Latest Ref Rng & Units 01/16/2023    3:26 PM 01/15/2022    8:15 AM 04/27/2021   10:33 AM 08/23/2020    3:19 PM 11/30/2018   10:58 AM  Diabetic Labs  HbA1c 4.8 - 5.6 % 5.8  5.7  5.5  5.7  5.3   Chol 100 - 199 mg/dL 161  096  045  409  811   HDL >39 mg/dL 58  56  57  60  57   Calc LDL 0 - 99 mg/dL 914  782  956  213  086   Triglycerides 0 - 149 mg/dL 578  469  629  528  413   Creatinine 0.57 - 1.00 mg/dL 2.44  0.10  2.72  5.36  0.75       03/14/2023    3:49 PM 02/24/2023    3:09 PM 01/20/2023    3:35 PM 01/20/2023    3:00 PM 01/16/2023    3:10 PM 01/16/2023    3:09 PM 01/16/2023    2:08 PM  BP/Weight  Systolic BP 123 127 147 148 144 142 125  Diastolic BP 85 78 93 90 84 88 86  Wt. (Lbs) 371.12 378  376.6   378.04  BMI 65.74 kg/m2 66.96 kg/m2  66.71 kg/m2   66.97 kg/m2       No data to display          Updated lab needed at/ before next visit.

## 2023-03-14 NOTE — Assessment & Plan Note (Signed)
Hyperlipidemia:Low fat diet discussed and encouraged.   Lipid Panel  Lab Results  Component Value Date   CHOL 235 (H) 01/16/2023   HDL 58 01/16/2023   LDLCALC 134 (H) 01/16/2023   TRIG 243 (H) 01/16/2023   CHOLHDL 4.1 01/16/2023     Updated lab needed at/ before next visit.

## 2023-03-14 NOTE — Assessment & Plan Note (Signed)
Only gets pre menstrual headaches which respond to maxalt , continue same

## 2023-03-14 NOTE — Assessment & Plan Note (Signed)
On treatment through Psych ,stable but not adequately controlled

## 2023-03-14 NOTE — Progress Notes (Signed)
Claire [name]     MRN: [id number]      DOB: 1980/05/26  Chief Complaint  Patient presents with   Follow-up    Follow up    HPI Claire [name] is here for follow up and re-evaluation of chronic medical conditions, medication management and review of any available recent lab and radiology data.  Preventive health is updated, specifically  Cancer screening and Immunization.   Questions or concerns regarding consultations or procedures which the PT has had in the interim are  addressed.On different C pill bleeding is controlled Tolerating BP med well with weight loss noted The PT denies any adverse reactions to current medications since the last visit.  There are no new concerns.  There are no specific complaints   ROS Denies recent fever or chills. Denies sinus pressure, nasal congestion, ear pain or sore throat. Denies chest congestion, productive cough or wheezing. Denies chest pains, palpitations and leg swelling Denies abdominal pain, nausea, vomiting,diarrhea or constipation.   Denies dysuria, frequency, hesitancy or incontinence. Denies joint pain, swelling and limitation in mobility. Denies headaches, seizures, numbness, or tingling. Denies depression, anxiety or insomnia. Denies skin break down or rash.   PE  BP 123/85 (BP Location: Right Arm, Patient Position: Sitting, Cuff Size: Large)   Pulse 72   Ht 5\' 3"  (1.6 m)   Wt (!) 371 lb 1.9 oz (168.3 kg)   LMP 02/15/2023   SpO2 94%   BMI 65.74 kg/m   Patient alert and oriented and in no cardiopulmonary distress.  HEENT: No facial asymmetry, EOMI,     Neck supple .  Chest: Clear to auscultation bilaterally.  CVS: S1, S2 no murmurs, no S3.Regular rate.     Ext: No edema  MS: Adequate ROM spine, shoulders, hips and knees.  Skin: Intact, no ulcerations or rash noted.  Psych: Good eye contact, normal affect. Memory intact not anxious or depressed appearing.  CNS: CN 2-12 intact, power,  normal throughout.no focal  deficits noted.   Assessment & Plan  Prediabetes Patient educated about the importance of limiting  Carbohydrate intake , the need to commit to daily physical activity for a minimum of 30 minutes , and to commit weight loss. The fact that changes in all these areas will reduce or eliminate all together the development of diabetes is stressed.      Latest Ref Rng & Units 01/16/2023    3:26 PM 01/15/2022    8:15 AM 04/27/2021   10:33 AM 08/23/2020    3:19 PM 11/30/2018   10:58 AM  Diabetic Labs  HbA1c 4.8 - 5.6 % 5.8  5.7  5.5  5.7  5.3   Chol 100 - 199 mg/dL 865  784  696  295  284   HDL >39 mg/dL 58  56  57  60  57   Calc LDL 0 - 99 mg/dL 132  440  102  725  366   Triglycerides 0 - 149 mg/dL 440  347  425  956  387   Creatinine 0.57 - 1.00 mg/dL 5.64  3.32  9.51  8.84  0.75       03/14/2023    3:49 PM 02/24/2023    3:09 PM 01/20/2023    3:35 PM 01/20/2023    3:00 PM 01/16/2023    3:10 PM 01/16/2023    3:09 PM 01/16/2023    2:08 PM  BP/Weight  Systolic BP 123 127 147 148 144 142 125  Diastolic BP 85  78 93 90 84 88 86  Wt. (Lbs) 371.12 378  376.6   378.04  BMI 65.74 kg/m2 66.96 kg/m2  66.71 kg/m2   66.97 kg/m2       No data to display          Updated lab needed at/ before next visit.   Severe major depression without psychotic features (HCC) On treatment through Psych ,stable but not adequately controlled  Morbid obesity with BMI of 60.0-69.9, adult [facility])  Patient re-educated about  the importance of commitment to a  minimum of 150 minutes of exercise per week as able.  The importance of healthy food choices with portion control discussed, as well as eating regularly and within a 12 hour window most days. The need to choose "clean , green" food 50 to 75% of the time is discussed, as well as to make water the primary drink and set a goal of 64 ounces water daily.       03/14/2023    3:49 PM 02/24/2023    3:09 PM 01/20/2023    3:00 PM  Weight /BMI  Weight 371 lb  1.9 oz 378 lb 376 lb 9.6 oz  Height 5\' 3"  (1.6 m) 5\' 3"  (1.6 m) 5\' 3"  (1.6 m)  BMI 65.74 kg/m2 66.96 kg/m2 66.71 kg/m2      Dyslipidemia, goal LDL below 100 Hyperlipidemia:Low fat diet discussed and encouraged.   Lipid Panel  Lab Results  Component Value Date   CHOL 235 (H) 01/16/2023   HDL 58 01/16/2023   LDLCALC 134 (H) 01/16/2023   TRIG 243 (H) 01/16/2023   CHOLHDL 4.1 01/16/2023     Updated lab needed at/ before next visit.   Chronic migraine Only gets pre menstrual headaches which respond to maxalt , continue same

## 2023-03-14 NOTE — Assessment & Plan Note (Signed)
  Patient re-educated about  the importance of commitment to a  minimum of 150 minutes of exercise per week as able.  The importance of healthy food choices with portion control discussed, as well as eating regularly and within a 12 hour window most days. The need to choose "clean , green" food 50 to 75% of the time is discussed, as well as to make water the primary drink and set a goal of 64 ounces water daily.       03/14/2023    3:49 PM 02/24/2023    3:09 PM 01/20/2023    3:00 PM  Weight /BMI  Weight 371 lb 1.9 oz 378 lb 376 lb 9.6 oz  Height 5\' 3"  (1.6 m) 5\' 3"  (1.6 m) 5\' 3"  (1.6 m)  BMI 65.74 kg/m2 66.96 kg/m2 66.71 kg/m2

## 2023-04-03 ENCOUNTER — Other Ambulatory Visit: Payer: Self-pay | Admitting: Family Medicine

## 2023-05-02 LAB — CMP14+EGFR
ALT: 7 [IU]/L (ref 0–32)
AST: 9 [IU]/L (ref 0–40)
Albumin: 4.2 g/dL (ref 3.9–4.9)
Alkaline Phosphatase: 114 [IU]/L (ref 44–121)
BUN/Creatinine Ratio: 16 (ref 9–23)
BUN: 14 mg/dL (ref 6–24)
Bilirubin Total: 0.2 mg/dL (ref 0.0–1.2)
CO2: 21 mmol/L (ref 20–29)
Calcium: 9.2 mg/dL (ref 8.7–10.2)
Chloride: 102 mmol/L (ref 96–106)
Creatinine, Ser: 0.88 mg/dL (ref 0.57–1.00)
Globulin, Total: 2.8 g/dL (ref 1.5–4.5)
Glucose: 101 mg/dL — ABNORMAL HIGH (ref 70–99)
Potassium: 4.6 mmol/L (ref 3.5–5.2)
Sodium: 140 mmol/L (ref 134–144)
Total Protein: 7 g/dL (ref 6.0–8.5)
eGFR: 84 mL/min/{1.73_m2} (ref >59)

## 2023-05-02 LAB — LIPID PANEL
Chol/HDL Ratio: 3.1 {ratio} (ref 0.0–4.4)
Cholesterol, Total: 184 mg/dL (ref 100–199)
HDL: 60 mg/dL (ref >39)
LDL Chol Calc (NIH): 92 mg/dL (ref 0–99)
Triglycerides: 190 mg/dL — ABNORMAL HIGH (ref 0–149)
VLDL Cholesterol Cal: 32 mg/dL (ref 5–40)

## 2023-05-02 LAB — HEPATIC FUNCTION PANEL: Bilirubin, Direct: <0.08 mg/dL (ref 0.00–0.40)

## 2023-05-27 ENCOUNTER — Encounter: Payer: Self-pay | Admitting: Adult Health

## 2023-05-27 ENCOUNTER — Ambulatory Visit: Payer: 59 | Admitting: Adult Health

## 2023-05-27 VITALS — BP 136/85 | HR 73 | Ht 63.0 in | Wt 375.2 lb

## 2023-05-27 DIAGNOSIS — Z3041 Encounter for surveillance of contraceptive pills: Secondary | ICD-10-CM

## 2023-05-27 DIAGNOSIS — N926 Irregular menstruation, unspecified: Secondary | ICD-10-CM | POA: Diagnosis not present

## 2023-05-27 DIAGNOSIS — R102 Pelvic and perineal pain: Secondary | ICD-10-CM | POA: Diagnosis not present

## 2023-05-27 NOTE — Progress Notes (Signed)
  Subjective:     Patient ID: Claire [name], female   DOB: [birth year], 43 y.o.   MRN: [id number]  HPI Claire [name] is a 43 year old white female with SO, G1P0010, back in follow up on taking lo Loestrin and happy with them, pain has resolved and periods 3 days now.     Component Value Date/Time   DIAGPAP  01/08/2022 1609    - Negative for intraepithelial lesion or malignancy (NILM)   DIAGPAP  12/02/2018 0000    NEGATIVE FOR INTRAEPITHELIAL LESIONS OR MALIGNANCY.   DIAGPAP SHIFT IN FLORA SUGGESTIVE OF BACTERIAL VAGINOSIS. 12/02/2018 0000   HPVHIGH Negative 01/08/2022 1609   ADEQPAP  01/08/2022 1609    Satisfactory for evaluation; transformation zone component ABSENT.   ADEQPAP  12/02/2018 0000    Satisfactory for evaluation  endocervical/transformation zone component ABSENT.   PCP is Dr Lodema Hong  Review of Systems Pain has resolve  Periods about 3 days now Reviewed past medical,surgical, social and family history. Reviewed medications and allergies.     Objective:   Physical Exam BP 136/85 (BP Location: Right Arm, Patient Position: Sitting, Cuff Size: Normal)   Pulse 73   Ht 5\' 3"  (1.6 m)   Wt (!) 375 lb 3.2 oz (170.2 kg)   LMP 05/10/2023 (Exact Date)   BMI 66.46 kg/m     Skin warm and dry. Lungs: clear to ausculation bilaterally. Cardiovascular: regular rate and rhythm. Exam by Toula Moos NP student  Fall risk is moderate  Upstream - 05/27/23 1609       Pregnancy Intention Screening   Does the patient want to become pregnant in the next year? No    Does the patient's partner want to become pregnant in the next year? No    Would the patient like to discuss contraceptive options today? No      Contraception Wrap Up   Current Method Oral Contraceptive    End Method Oral Contraceptive    Contraception Counseling Provided Yes             Assessment:     1. Encounter for surveillance of contraceptive pills (Primary) Happy with lo Loestrin, has refills   2. Irregular  bleeding Periods more regular and last about 3 days   3. Pelvic pain Has resolved with lo loestrin     Plan:     Follow up in 9 months or sooner if needed

## 2023-07-06 ENCOUNTER — Other Ambulatory Visit: Payer: Self-pay | Admitting: Family Medicine

## 2023-07-12 ENCOUNTER — Other Ambulatory Visit: Payer: Self-pay | Admitting: Family Medicine

## 2023-07-17 ENCOUNTER — Encounter: Payer: Self-pay | Admitting: Family Medicine

## 2023-07-18 ENCOUNTER — Encounter: Payer: Self-pay | Admitting: Family Medicine

## 2023-07-18 ENCOUNTER — Ambulatory Visit: Payer: BC Managed Care – PPO | Admitting: Family Medicine

## 2023-07-18 VITALS — BP 130/84 | HR 72 | Resp 16 | Ht 63.0 in | Wt 378.4 lb

## 2023-07-18 DIAGNOSIS — R7303 Prediabetes: Secondary | ICD-10-CM | POA: Diagnosis not present

## 2023-07-18 DIAGNOSIS — I10 Essential (primary) hypertension: Secondary | ICD-10-CM

## 2023-07-18 DIAGNOSIS — E785 Hyperlipidemia, unspecified: Secondary | ICD-10-CM | POA: Diagnosis not present

## 2023-07-18 DIAGNOSIS — J209 Acute bronchitis, unspecified: Secondary | ICD-10-CM | POA: Insufficient documentation

## 2023-07-18 DIAGNOSIS — Z6841 Body Mass Index (BMI) 40.0 and over, adult: Secondary | ICD-10-CM

## 2023-07-18 DIAGNOSIS — R251 Tremor, unspecified: Secondary | ICD-10-CM

## 2023-07-18 MED ORDER — EZETIMIBE 10 MG PO TABS: 10.0000 mg | ORAL_TABLET | Freq: Every day | ORAL | 1 refills | Status: DC

## 2023-07-18 MED ORDER — FLUCONAZOLE 150 MG PO TABS: 150.0000 mg | ORAL_TABLET | Freq: Once | ORAL | 0 refills | Status: AC

## 2023-07-18 MED ORDER — AZITHROMYCIN 250 MG PO TABS: ORAL_TABLET | ORAL | 0 refills | Status: AC

## 2023-07-18 NOTE — Assessment & Plan Note (Addendum)
 Patient educated about the importance of limiting  Carbohydrate intake , the need to commit to daily physical activity for a minimum of 30 minutes , and to commit weight loss. The fact that changes in all these areas will reduce or eliminate all together the development of diabetes is stressed.  Most recent lab on day of visit shows improvement, which is good, still not normal, still prediabetic    Latest Ref Rng & Units 05/01/2023    8:11 AM 01/16/2023    3:26 PM 01/15/2022    8:15 AM 04/27/2021   10:33 AM 08/23/2020    3:19 PM  Diabetic Labs  HbA1c 4.8 - 5.6 %  5.8  5.7  5.5  5.7   Chol 100 - 199 mg/dL 657  846  962  952  841   HDL >39 mg/dL 60  58  56  57  60   Calc LDL 0 - 99 mg/dL 92  324  401  027  253   Triglycerides 0 - 149 mg/dL 664  403  474  259  563   Creatinine 0.57 - 1.00 mg/dL 8.75  6.43  3.29  5.18  0.85       07/18/2023    4:16 PM 07/18/2023    3:49 PM 05/27/2023    4:23 PM 05/27/2023    4:05 PM 03/14/2023    3:49 PM 02/24/2023    3:09 PM 01/20/2023    3:35 PM  BP/Weight  Systolic BP 130 147 136 145 123 127 147  Diastolic BP 84 71 85 87 85 78 93  Wt. (Lbs)  378.4  375.2 371.12 378   BMI  67.03 kg/m2  66.46 kg/m2 65.74 kg/m2 66.96 kg/m2        No data to display          Updated lab needed

## 2023-07-18 NOTE — Patient Instructions (Addendum)
 F/U in 4 months, call if you need me sooner  HbA1C today  New additional medication for cholesterol/ triglycerides, is ezetemide one daily, continue rosuvastatin as before and low fat diet  Blood pressure is improved as is your cholesterol continue spironolactone.  Important to take propranolol 12 hours apart 1 twice daily this may be a part of the reason why you are having tremors as you are only taking it once daily   Azithromycin for 5 days an antibiotic is prescribed for your acute bronchitis and 1 fluconazole tablet for use if needed at the end of the antibiotic course.  Please follow through and have your colonoscopy as we have discussed in the past, this is important.  Try to follow through on an exercise commitment both for mental distress as well as physical fitness.  Establish some sort of routine daily to  give yourself 30 minutes to an hour every day to mentally and emotionally de stress, thank you for what you do with the children!  Thanks for choosing [facility], we consider it a privelige to serve you.

## 2023-07-19 ENCOUNTER — Encounter: Payer: Self-pay | Admitting: Family Medicine

## 2023-07-19 LAB — HEMOGLOBIN A1C
Est. average glucose Bld gHb Est-mCnc: 117 mg/dL
Hgb A1c MFr Bld: 5.7 % — ABNORMAL HIGH (ref 4.8–5.6)

## 2023-07-21 ENCOUNTER — Encounter: Payer: Self-pay | Admitting: Family Medicine

## 2023-07-21 DIAGNOSIS — R251 Tremor, unspecified: Secondary | ICD-10-CM | POA: Insufficient documentation

## 2023-07-21 NOTE — Assessment & Plan Note (Signed)
  Patient re-educated about  the importance of commitment to a  minimum of 150 minutes of exercise per week as able.  The importance of healthy food choices with portion control discussed, as well as eating regularly and within a 12 hour window most days. The need to choose "clean , green" food 50 to 75% of the time is discussed, as well as to make water the primary drink and set a goal of 64 ounces water daily.       07/18/2023    3:49 PM 05/27/2023    4:05 PM 03/14/2023    3:49 PM  Weight /BMI  Weight 378 lb 6.4 oz 375 lb 3.2 oz 371 lb 1.9 oz  Height 5\' 3"  (1.6 m) 5\' 3"  (1.6 m) 5\' 3"  (1.6 m)  BMI 67.03 kg/m2 66.46 kg/m2 65.74 kg/m2    Deteriorated

## 2023-07-21 NOTE — Assessment & Plan Note (Signed)
 DASH diet and commitment to daily physical activity for a minimum of 30 minutes discussed and encouraged, as a part of hypertension management. The importance of attaining a healthy weight is also discussed.     07/18/2023    4:16 PM 07/18/2023    3:49 PM 05/27/2023    4:23 PM 05/27/2023    4:05 PM 03/14/2023    3:49 PM 02/24/2023    3:09 PM 01/20/2023    3:35 PM  BP/Weight  Systolic BP 130 147 136 145 123 127 147  Diastolic BP 84 71 85 87 85 78 93  Wt. (Lbs)  378.4  375.2 371.12 378   BMI  67.03 kg/m2  66.46 kg/m2 65.74 kg/m2 66.96 kg/m2      Controlled, no change in medication

## 2023-07-21 NOTE — Assessment & Plan Note (Signed)
 Controlled, no change in medication

## 2023-07-21 NOTE — Assessment & Plan Note (Signed)
 Hyperlipidemia:Low fat diet discussed and encouraged.   Lipid Panel  Lab Results  Component Value Date   CHOL 184 05/01/2023   HDL 60 05/01/2023   LDLCALC 92 05/01/2023   TRIG 190 (H) 05/01/2023   CHOLHDL 3.1 05/01/2023     Start zetia,and lower fat intake, continue statin

## 2023-07-21 NOTE — Progress Notes (Signed)
 Claire [name]     MRN: [id number]      DOB: Nov 27, 1980  Chief Complaint  Patient presents with   shakiness    A few spells of shakiness lasting a few minutes, doesn't know if its related to any of her meds but she noticed it the other night before bed so she wanted to mention it    Nasal Congestion    Cough and sinus congestion x 1 week, some pain in chest when she coughs, scratchy throat, coughing up greenish tinted mucus. No fever that she has noticed.     HPI Ms. Marquina is here for follow up and re-evaluation of chronic medical conditions, medication management and review of any available recent lab and radiology data.  Preventive health is updated, specifically  Cancer screening sti;ll needs colonoscopy, and Immunization.   Questions or concerns regarding consultations or procedures which the PT has had in the interim are  addressed. The PT denies any adverse reactions to current medications since the last visit.  Concerns as above ROS Denies recent fever or chills. Denies chest pains, palpitations and leg swelling, c/o inermittent tremor over past several weeks Denies abdominal pain, nausea, vomiting,diarrhea or constipation.   Denies dysuria, frequency, hesitancy or incontinence. Denies joint pain, swelling and limitation in mobility. Denies headaches, seizures, numbness, or tingling. Denies depression, anxiety or insomnia. Denies skin break down or rash.   PE  BP 130/84   Pulse 72   Resp 16   Ht 5\' 3"  (1.6 m)   Wt (!) 378 lb 6.4 oz (171.6 kg)   SpO2 97%   BMI 67.03 kg/m   Patient alert and oriented and in no cardiopulmonary distress.  HEENT: No facial asymmetry, EOMI,     Neck supple .Maxillary sinus tenderness  Chest: Clear to auscultation bilaterally.  CVS: S1, S2 no murmurs, no S3.Regular rate.  ABD: Soft non tender.   Ext: No edema  MS: Adequate ROM spine, shoulders, hips and knees.  Skin: Intact, no ulcerations or rash noted.  Psych: Good eye contact,  normal affect. Memory intact not anxious or depressed appearing.  CNS: CN 2-12 intact, power,  normal throughout.no focal deficits noted.   Assessment & Plan  Prediabetes Patient educated about the importance of limiting  Carbohydrate intake , the need to commit to daily physical activity for a minimum of 30 minutes , and to commit weight loss. The fact that changes in all these areas will reduce or eliminate all together the development of diabetes is stressed.  Most recent lab on day of visit shows improvement, which is good, still not normal, still prediabetic    Latest Ref Rng & Units 05/01/2023    8:11 AM 01/16/2023    3:26 PM 01/15/2022    8:15 AM 04/27/2021   10:33 AM 08/23/2020    3:19 PM  Diabetic Labs  HbA1c 4.8 - 5.6 %  5.8  5.7  5.5  5.7   Chol 100 - 199 mg/dL 409  811  914  782  956   HDL >39 mg/dL 60  58  56  57  60   Calc LDL 0 - 99 mg/dL 92  213  086  578  469   Triglycerides 0 - 149 mg/dL 629  528  413  244  010   Creatinine 0.57 - 1.00 mg/dL 2.72  5.36  6.44  0.34  0.85       07/18/2023    4:16 PM 07/18/2023  3:49 PM 05/27/2023    4:23 PM 05/27/2023    4:05 PM 03/14/2023    3:49 PM 02/24/2023    3:09 PM 01/20/2023    3:35 PM  BP/Weight  Systolic BP 130 147 136 145 123 127 147  Diastolic BP 84 71 85 87 85 78 93  Wt. (Lbs)  378.4  375.2 371.12 378   BMI  67.03 kg/m2  66.46 kg/m2 65.74 kg/m2 66.96 kg/m2        No data to display          Updated lab needed  Acute bronchitis Z pack and fluconazole x 1 tab, use decongestant already purchased as needed  Morbid obesity with BMI of 60.0-69.9, adult [facility])  Patient re-educated about  the importance of commitment to a  minimum of 150 minutes of exercise per week as able.  The importance of healthy food choices with portion control discussed, as well as eating regularly and within a 12 hour window most days. The need to choose "clean , green" food 50 to 75% of the time is discussed, as well as to make water the  primary drink and set a goal of 64 ounces water daily.       07/18/2023    3:49 PM 05/27/2023    4:05 PM 03/14/2023    3:49 PM  Weight /BMI  Weight 378 lb 6.4 oz 375 lb 3.2 oz 371 lb 1.9 oz  Height 5\' 3"  (1.6 m) 5\' 3"  (1.6 m) 5\' 3"  (1.6 m)  BMI 67.03 kg/m2 66.46 kg/m2 65.74 kg/m2    Deteriorated  Dyslipidemia, goal LDL below 100 Hyperlipidemia:Low fat diet discussed and encouraged.   Lipid Panel  Lab Results  Component Value Date   CHOL 184 05/01/2023   HDL 60 05/01/2023   LDLCALC 92 05/01/2023   TRIG 190 (H) 05/01/2023   CHOLHDL 3.1 05/01/2023     Start zetia,and lower fat intake, continue statin  Chronic migraine Controlled, no change in medication   Essential hypertension DASH diet and commitment to daily physical activity for a minimum of 30 minutes discussed and encouraged, as a part of hypertension management. The importance of attaining a healthy weight is also discussed.     07/18/2023    4:16 PM 07/18/2023    3:49 PM 05/27/2023    4:23 PM 05/27/2023    4:05 PM 03/14/2023    3:49 PM 02/24/2023    3:09 PM 01/20/2023    3:35 PM  BP/Weight  Systolic BP 130 147 136 145 123 127 147  Diastolic BP 84 71 85 87 85 78 93  Wt. (Lbs)  378.4  375.2 371.12 378   BMI  67.03 kg/m2  66.46 kg/m2 65.74 kg/m2 66.96 kg/m2      Controlled, no change in medication   Tremor Taking betablocker incorrectly, once not twice daily as prescribed , she is to change this

## 2023-07-21 NOTE — Assessment & Plan Note (Signed)
 Taking betablocker incorrectly, once not twice daily as prescribed , she is to change this

## 2023-07-21 NOTE — Assessment & Plan Note (Signed)
 Z pack and fluconazole x 1 tab, use decongestant already purchased as needed

## 2023-07-22 ENCOUNTER — Encounter: Payer: Self-pay | Admitting: *Deleted

## 2023-08-05 ENCOUNTER — Other Ambulatory Visit (HOSPITAL_COMMUNITY): Payer: Self-pay | Admitting: Psychiatry

## 2023-08-05 NOTE — Telephone Encounter (Signed)
 Call for appt

## 2023-09-04 NOTE — Telephone Encounter (Signed)
 lmom

## 2023-09-14 ENCOUNTER — Encounter: Payer: Self-pay | Admitting: Family Medicine

## 2023-10-04 ENCOUNTER — Other Ambulatory Visit: Payer: Self-pay | Admitting: Family Medicine

## 2023-10-04 ENCOUNTER — Other Ambulatory Visit (HOSPITAL_COMMUNITY): Payer: Self-pay | Admitting: Psychiatry

## 2023-10-06 NOTE — Telephone Encounter (Signed)
 Call for appt

## 2023-10-08 ENCOUNTER — Other Ambulatory Visit: Payer: Self-pay | Admitting: Family Medicine

## 2023-11-05 ENCOUNTER — Other Ambulatory Visit (HOSPITAL_COMMUNITY): Payer: Self-pay | Admitting: Psychiatry

## 2023-11-05 ENCOUNTER — Other Ambulatory Visit: Payer: Self-pay | Admitting: Family Medicine

## 2023-11-05 NOTE — Telephone Encounter (Signed)
 Call for appt

## 2023-11-12 NOTE — Telephone Encounter (Signed)
 Called pt no answer left vm

## 2023-11-13 ENCOUNTER — Encounter: Payer: Self-pay | Admitting: Family Medicine

## 2023-11-18 ENCOUNTER — Ambulatory Visit: Admitting: Family Medicine

## 2023-11-18 ENCOUNTER — Other Ambulatory Visit (HOSPITAL_COMMUNITY): Payer: Self-pay | Admitting: Family Medicine

## 2023-11-18 ENCOUNTER — Encounter: Payer: Self-pay | Admitting: Family Medicine

## 2023-11-18 VITALS — BP 114/78 | HR 80 | Resp 18 | Ht 63.0 in | Wt 377.1 lb

## 2023-11-18 DIAGNOSIS — E785 Hyperlipidemia, unspecified: Secondary | ICD-10-CM

## 2023-11-18 DIAGNOSIS — G4733 Obstructive sleep apnea (adult) (pediatric): Secondary | ICD-10-CM | POA: Insufficient documentation

## 2023-11-18 DIAGNOSIS — E559 Vitamin D deficiency, unspecified: Secondary | ICD-10-CM | POA: Insufficient documentation

## 2023-11-18 DIAGNOSIS — F322 Major depressive disorder, single episode, severe without psychotic features: Secondary | ICD-10-CM

## 2023-11-18 DIAGNOSIS — Z6841 Body Mass Index (BMI) 40.0 and over, adult: Secondary | ICD-10-CM

## 2023-11-18 DIAGNOSIS — R7303 Prediabetes: Secondary | ICD-10-CM

## 2023-11-18 DIAGNOSIS — I10 Essential (primary) hypertension: Secondary | ICD-10-CM

## 2023-11-18 DIAGNOSIS — Z1231 Encounter for screening mammogram for malignant neoplasm of breast: Secondary | ICD-10-CM

## 2023-11-18 DIAGNOSIS — F411 Generalized anxiety disorder: Secondary | ICD-10-CM | POA: Insufficient documentation

## 2023-11-18 NOTE — Assessment & Plan Note (Addendum)
 Needs re evaluation  asap, symptomatic, headache , low energy , excessive daytime sleepiness

## 2023-11-18 NOTE — Assessment & Plan Note (Signed)
 Hyperlipidemia:Low fat diet discussed and encouraged.   Lipid Panel  Lab Results  Component Value Date   CHOL 184 05/01/2023   HDL 60 05/01/2023   LDLCALC 92 05/01/2023   TRIG 190 (H) 05/01/2023   CHOLHDL 3.1 05/01/2023     Updated lab needed at/ before next visit.

## 2023-11-18 NOTE — Assessment & Plan Note (Signed)
 Needs appt with Psych and therapist

## 2023-11-18 NOTE — Progress Notes (Signed)
 Claire [name]     MRN: [id number]      DOB: 1980-07-04  Chief Complaint  Patient presents with   Hypertension    4 month follow up    Headache    Pt complains that her headaches have been getting more frequent     HPI Claire [name] is here for follow up and re-evaluation of chronic medical conditions, medication management and review of any available recent lab and radiology data.  Preventive health is updated, specifically  Cancer screening and Immunization.   1 month h/o poor sleep, attaining and maintaining, headache , excessive fatigue, needs re eval for sleep apnea Ready fior surgery for weight management Mental health not the best, neds f/u with Psych C/o leg swelling after trip to [place] and increased puffiness in rash on right leg both of which have resolved ROS Denies recent fever or chills. Denies sinus pressure, nasal congestion, ear pain or sore throat. Denies chest congestion, productive cough or wheezing. Denies chest pains, palpitations g Denies abdominal pain, nausea, vomiting,diarrhea or constipation.   Denies dysuria, frequency, hesitancy or incontinence. Denies joint pain, swelling and limitation in mobility. .  PE  BP 114/78   Pulse 80   Resp 18   Ht 5' 3 (1.6 m)   Wt (!) 377 lb 1.3 oz (171 kg)   SpO2 97%   BMI 66.80 kg/m   Patient alert and oriented and in no cardiopulmonary distress.  HEENT: No facial asymmetry, EOMI,     Neck supple .  Chest: Clear to auscultation bilaterally.  CVS: S1, S2 no murmurs, no S3.Regular rate.  ABD: Soft non tender.   Ext: No edema  MS: Adequate ROM spine, shoulders, hips and knees.  Skin: Intact, no ulcerations or rash noted.  Psych: Good eye contact, normal affect. Memory intact mildly  anxious and  depressed appearing.at times, may be tearful  CNS: CN 2-12 intact, power,  normal throughout.no focal deficits noted.   Assessment & Plan  OSA (obstructive sleep apnea) Needs re evaluation  asap, symptomatic,  headache , low energy , excessive daytime sleepiness  Severe major depression without psychotic features (HCC) Needs to sched f/u with Psych, 1 month h/o sleep d/o , both falling and staying asleep  GAD (generalized anxiety disorder) Needs appt with Psych and therapist  Essential hypertension Controlled, no change in medication DASH diet and commitment to daily physical activity for a minimum of 30 minutes discussed and encouraged, as a part of hypertension management. The importance of attaining a healthy weight is also discussed.     11/18/2023   10:13 AM 11/18/2023    9:41 AM 07/18/2023    4:16 PM 07/18/2023    3:49 PM 05/27/2023    4:23 PM 05/27/2023    4:05 PM 03/14/2023    3:49 PM  BP/Weight  Systolic BP 114 135 130 147 136 145 123  Diastolic BP 78 92 84 71 85 87 85  Wt. (Lbs)  377.08  378.4  375.2 371.12  BMI  66.8 kg/m2  67.03 kg/m2  66.46 kg/m2 65.74 kg/m2       Dyslipidemia, goal LDL below 100 Hyperlipidemia:Low fat diet discussed and encouraged.   Lipid Panel  Lab Results  Component Value Date   CHOL 184 05/01/2023   HDL 60 05/01/2023   LDLCALC 92 05/01/2023   TRIG 190 (H) 05/01/2023   CHOLHDL 3.1 05/01/2023     Updated lab needed at/ before next visit.   Prediabetes Patient educated about the  importance of limiting  Carbohydrate intake , the need to commit to daily physical activity for a minimum of 30 minutes , and to commit weight loss. The fact that changes in all these areas will reduce or eliminate all together the development of diabetes is stressed.      Latest Ref Rng & Units 07/18/2023    4:31 PM 05/01/2023    8:11 AM 01/16/2023    3:26 PM 01/15/2022    8:15 AM 04/27/2021   10:33 AM  Diabetic Labs  HbA1c 4.8 - 5.6 % 5.7   5.8  5.7  5.5   Chol 100 - 199 mg/dL  815  764  787  772   HDL >39 mg/dL  60  58  56  57   Calc LDL 0 - 99 mg/dL  92  865  873  867   Triglycerides 0 - 149 mg/dL  809  756  829  787   Creatinine 0.57 - 1.00 mg/dL  9.11   9.21  9.27  9.22       11/18/2023   10:13 AM 11/18/2023    9:41 AM 07/18/2023    4:16 PM 07/18/2023    3:49 PM 05/27/2023    4:23 PM 05/27/2023    4:05 PM 03/14/2023    3:49 PM  BP/Weight  Systolic BP 114 135 130 147 136 145 123  Diastolic BP 78 92 84 71 85 87 85  Wt. (Lbs)  377.08  378.4  375.2 371.12  BMI  66.8 kg/m2  67.03 kg/m2  66.46 kg/m2 65.74 kg/m2       No data to display          Updated lab needed at/ before next visit.   Vitamin D  deficiency Updated lab needed at/ before next visit.   Morbid obesity with BMI of 60.0-69.9, adult (HCC) Unchanged, refer Bariatric  Patient re-educated about  the importance of commitment to a  minimum of 150 minutes of exercise per week as able.  The importance of healthy food choices with portion control discussed, as well as eating regularly and within a 12 hour window most days. The need to choose clean , green food 50 to 75% of the time is discussed, as well as to make water the primary drink and set a goal of 64 ounces water daily.       11/18/2023    9:41 AM 07/18/2023    3:49 PM 05/27/2023    4:05 PM  Weight /BMI  Weight 377 lb 1.3 oz 378 lb 6.4 oz 375 lb 3.2 oz  Height 5' 3 (1.6 m) 5' 3 (1.6 m) 5' 3 (1.6 m)  BMI 66.8 kg/m2 67.03 kg/m2 66.46 kg/m2     .

## 2023-11-18 NOTE — Assessment & Plan Note (Signed)
 Unchanged, refer Bariatric  Patient re-educated about  the importance of commitment to a  minimum of 150 minutes of exercise per week as able.  The importance of healthy food choices with portion control discussed, as well as eating regularly and within a 12 hour window most days. The need to choose clean , green food 50 to 75% of the time is discussed, as well as to make water the primary drink and set a goal of 64 ounces water daily.       11/18/2023    9:41 AM 07/18/2023    3:49 PM 05/27/2023    4:05 PM  Weight /BMI  Weight 377 lb 1.3 oz 378 lb 6.4 oz 375 lb 3.2 oz  Height 5' 3 (1.6 m) 5' 3 (1.6 m) 5' 3 (1.6 m)  BMI 66.8 kg/m2 67.03 kg/m2 66.46 kg/m2

## 2023-11-18 NOTE — Assessment & Plan Note (Signed)
 Patient educated about the importance of limiting  Carbohydrate intake , the need to commit to daily physical activity for a minimum of 30 minutes , and to commit weight loss. The fact that changes in all these areas will reduce or eliminate all together the development of diabetes is stressed.      Latest Ref Rng & Units 07/18/2023    4:31 PM 05/01/2023    8:11 AM 01/16/2023    3:26 PM 01/15/2022    8:15 AM 04/27/2021   10:33 AM  Diabetic Labs  HbA1c 4.8 - 5.6 % 5.7   5.8  5.7  5.5   Chol 100 - 199 mg/dL  815  764  787  772   HDL >39 mg/dL  60  58  56  57   Calc LDL 0 - 99 mg/dL  92  865  873  867   Triglycerides 0 - 149 mg/dL  809  756  829  787   Creatinine 0.57 - 1.00 mg/dL  9.11  9.21  9.27  9.22       11/18/2023   10:13 AM 11/18/2023    9:41 AM 07/18/2023    4:16 PM 07/18/2023    3:49 PM 05/27/2023    4:23 PM 05/27/2023    4:05 PM 03/14/2023    3:49 PM  BP/Weight  Systolic BP 114 135 130 147 136 145 123  Diastolic BP 78 92 84 71 85 87 85  Wt. (Lbs)  377.08  378.4  375.2 371.12  BMI  66.8 kg/m2  67.03 kg/m2  66.46 kg/m2 65.74 kg/m2       No data to display          Updated lab needed at/ before next visit.

## 2023-11-18 NOTE — Patient Instructions (Addendum)
 Follow-up in November,.  Hepatitis B #1 vaccine today.  Please schedule nurse visits for hepatitis B #2 and 3 vaccines and 2 and 6 months respectively.  Please schedule mammogram at checkout.  You are referred for sleep study as soon as possible.( Please  if she can get appointyment scheduled when she uis checking out)  You are referred to bariatric surgery surgery.  Blood pressure is adequately controlled.  Need to schedule an appointment with Dr. Okey after leaving here.  CBC, fasting lipid, cmp and EGFR , HBA1C, TSH and vit D today  Better health days are ahead we are working towards this.We  wILL get there  It is important that you exercise regularly at least 30 minutes 5 times a week. If you develop chest pain, have severe difficulty breathing, or feel very tired, stop exercising immediately and seek medical attention   Thanks for choosing [facility], we consider it a privelige to serve you.

## 2023-11-18 NOTE — Assessment & Plan Note (Signed)
 Updated lab needed at/ before next visit.

## 2023-11-18 NOTE — Assessment & Plan Note (Signed)
 Controlled, no change in medication DASH diet and commitment to daily physical activity for a minimum of 30 minutes discussed and encouraged, as a part of hypertension management. The importance of attaining a healthy weight is also discussed.     11/18/2023   10:13 AM 11/18/2023    9:41 AM 07/18/2023    4:16 PM 07/18/2023    3:49 PM 05/27/2023    4:23 PM 05/27/2023    4:05 PM 03/14/2023    3:49 PM  BP/Weight  Systolic BP 114 135 130 147 136 145 123  Diastolic BP 78 92 84 71 85 87 85  Wt. (Lbs)  377.08  378.4  375.2 371.12  BMI  66.8 kg/m2  67.03 kg/m2  66.46 kg/m2 65.74 kg/m2

## 2023-11-18 NOTE — Assessment & Plan Note (Signed)
 Needs to sched f/u with Psych, 1 month h/o sleep d/o , both falling and staying asleep

## 2023-11-19 LAB — CMP14+EGFR
ALT: 11 IU/L (ref 0–32)
AST: 11 IU/L (ref 0–40)
Albumin: 4.3 g/dL (ref 3.9–4.9)
Alkaline Phosphatase: 108 IU/L (ref 44–121)
BUN/Creatinine Ratio: 14 (ref 9–23)
BUN: 14 mg/dL (ref 6–24)
Bilirubin Total: 0.3 mg/dL (ref 0.0–1.2)
CO2: 20 mmol/L (ref 20–29)
Calcium: 10.1 mg/dL (ref 8.7–10.2)
Chloride: 101 mmol/L (ref 96–106)
Creatinine, Ser: 0.98 mg/dL (ref 0.57–1.00)
Globulin, Total: 2.7 g/dL (ref 1.5–4.5)
Glucose: 89 mg/dL (ref 70–99)
Potassium: 5.3 mmol/L — ABNORMAL HIGH (ref 3.5–5.2)
Sodium: 138 mmol/L (ref 134–144)
Total Protein: 7 g/dL (ref 6.0–8.5)
eGFR: 74 mL/min/1.73 (ref >59)

## 2023-11-19 LAB — CBC WITH DIFFERENTIAL/PLATELET
Basophils Absolute: 0 x10E3/uL (ref 0.0–0.2)
Basos: 0 %
EOS (ABSOLUTE): 0.5 x10E3/uL — ABNORMAL HIGH (ref 0.0–0.4)
Eos: 6 %
Hematocrit: 41.7 % (ref 34.0–46.6)
Hemoglobin: 13.1 g/dL (ref 11.1–15.9)
Immature Grans (Abs): 0 x10E3/uL (ref 0.0–0.1)
Immature Granulocytes: 0 %
Lymphocytes Absolute: 2.1 x10E3/uL (ref 0.7–3.1)
Lymphs: 23 %
MCH: 28.5 pg (ref 26.6–33.0)
MCHC: 31.4 g/dL — ABNORMAL LOW (ref 31.5–35.7)
MCV: 91 fL (ref 79–97)
Monocytes Absolute: 0.5 x10E3/uL (ref 0.1–0.9)
Monocytes: 6 %
Neutrophils Absolute: 5.7 x10E3/uL (ref 1.4–7.0)
Neutrophils: 65 %
Platelets: 388 x10E3/uL (ref 150–450)
RBC: 4.59 x10E6/uL (ref 3.77–5.28)
RDW: 13.6 % (ref 11.7–15.4)
WBC: 8.8 x10E3/uL (ref 3.4–10.8)

## 2023-11-19 LAB — HEMOGLOBIN A1C
Est. average glucose Bld gHb Est-mCnc: 114 mg/dL
Hgb A1c MFr Bld: 5.6 % (ref 4.8–5.6)

## 2023-11-19 LAB — LIPID PANEL
Chol/HDL Ratio: 2.6 ratio (ref 0.0–4.4)
Cholesterol, Total: 171 mg/dL (ref 100–199)
HDL: 65 mg/dL (ref >39)
LDL Chol Calc (NIH): 76 mg/dL (ref 0–99)
Triglycerides: 182 mg/dL — ABNORMAL HIGH (ref 0–149)
VLDL Cholesterol Cal: 30 mg/dL (ref 5–40)

## 2023-11-19 LAB — TSH: TSH: 2.51 u[IU]/mL (ref 0.450–4.500)

## 2023-11-19 LAB — VITAMIN D 25 HYDROXY (VIT D DEFICIENCY, FRACTURES): Vit D, 25-Hydroxy: 48.9 ng/mL (ref 30.0–100.0)

## 2023-11-20 ENCOUNTER — Ambulatory Visit: Payer: Self-pay | Admitting: Family Medicine

## 2023-11-25 ENCOUNTER — Ambulatory Visit

## 2023-11-25 DIAGNOSIS — Z23 Encounter for immunization: Secondary | ICD-10-CM

## 2023-11-25 NOTE — Progress Notes (Signed)
 Patient is in office today for a nurse visit for Immunization. Patient Injection was given in the  Left deltoid. Patient tolerated injection well.

## 2023-12-01 ENCOUNTER — Telehealth: Payer: Self-pay

## 2023-12-01 NOTE — Telephone Encounter (Signed)
 ATC x1 for precharting

## 2023-12-03 ENCOUNTER — Ambulatory Visit: Admitting: Pulmonary Disease

## 2024-01-04 ENCOUNTER — Other Ambulatory Visit (HOSPITAL_COMMUNITY): Payer: Self-pay | Admitting: Psychiatry

## 2024-01-05 ENCOUNTER — Other Ambulatory Visit (HOSPITAL_COMMUNITY): Payer: Self-pay | Admitting: Psychiatry

## 2024-01-05 DIAGNOSIS — F321 Major depressive disorder, single episode, moderate: Secondary | ICD-10-CM

## 2024-01-08 ENCOUNTER — Other Ambulatory Visit: Payer: Self-pay | Admitting: Family Medicine

## 2024-01-08 ENCOUNTER — Other Ambulatory Visit (HOSPITAL_COMMUNITY): Payer: Self-pay | Admitting: Psychiatry

## 2024-01-10 ENCOUNTER — Other Ambulatory Visit: Payer: Self-pay | Admitting: Family Medicine

## 2024-01-23 ENCOUNTER — Ambulatory Visit (INDEPENDENT_AMBULATORY_CARE_PROVIDER_SITE_OTHER)

## 2024-01-23 DIAGNOSIS — Z23 Encounter for immunization: Secondary | ICD-10-CM

## 2024-01-23 NOTE — Progress Notes (Signed)
 Patient is in office today for a nurse visit for Immunization. Patient Injection was given in the  Left deltoid. Patient tolerated injection well.

## 2024-01-26 ENCOUNTER — Ambulatory Visit

## 2024-02-02 ENCOUNTER — Ambulatory Visit (HOSPITAL_COMMUNITY)
Admission: RE | Admit: 2024-02-02 | Discharge: 2024-02-02 | Disposition: A | Discharge status: Home / Self Care | Source: Ambulatory Visit | Attending: Family Medicine | Admitting: Family Medicine

## 2024-02-02 ENCOUNTER — Encounter (HOSPITAL_COMMUNITY): Payer: Self-pay

## 2024-02-02 DIAGNOSIS — Z1231 Encounter for screening mammogram for malignant neoplasm of breast: Secondary | ICD-10-CM | POA: Diagnosis present

## 2024-02-03 ENCOUNTER — Ambulatory Visit (HOSPITAL_COMMUNITY): Admitting: Psychiatry

## 2024-02-04 ENCOUNTER — Other Ambulatory Visit: Payer: Self-pay | Admitting: Family Medicine

## 2024-02-05 ENCOUNTER — Encounter (HOSPITAL_COMMUNITY): Payer: Self-pay | Admitting: Psychiatry

## 2024-02-05 ENCOUNTER — Telehealth (HOSPITAL_COMMUNITY): Admitting: Psychiatry

## 2024-02-05 DIAGNOSIS — F321 Major depressive disorder, single episode, moderate: Secondary | ICD-10-CM | POA: Diagnosis not present

## 2024-02-05 MED ORDER — VENLAFAXINE HCL ER 150 MG PO CP24: ORAL_CAPSULE | ORAL | 2 refills | Status: AC

## 2024-02-05 MED ORDER — VORTIOXETINE HBR 20 MG PO TABS: 20.0000 mg | ORAL_TABLET | Freq: Every day | ORAL | 2 refills | Status: DC

## 2024-02-05 MED ORDER — VORTIOXETINE HBR 10 MG PO TABS: 10.0000 mg | ORAL_TABLET | Freq: Every day | ORAL | 2 refills | Status: DC

## 2024-02-05 NOTE — Progress Notes (Signed)
 Virtual Visit via Video Note  I connected with Claire [name] on 02/05/24 at  3:40 PM EDT by a video enabled telemedicine application and verified that I am speaking with the correct person using two identifiers.  Location: Patient: home Provider: office   I discussed the limitations of evaluation and management by telemedicine and the availability of in person appointments. The patient expressed understanding and agreed to proceed.    I discussed the assessment and treatment plan with the patient. The patient was provided an opportunity to ask questions and all were answered. The patient agreed with the plan and demonstrated an understanding of the instructions.   The patient was advised to call back or seek an in-person evaluation if the symptoms worsen or if the condition fails to improve as anticipated.  I provided 20 minutes of non-face-to-face time during this encounter.   Claire Gull, MD  [facility] MD/PA/NP OP Progress Note  02/05/2024 3:55 PM Claire [name]  MRN:  [id number]  Chief Complaint:  Chief Complaint  Patient presents with   Anxiety   Depression   Follow-up   HPI: This patient is a 43 year old white female who lives with her boyfriend in [place].  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  The patient returns for follow-up after about 11 months regarding her depression.  She states overall she is doing well.  Unfortunately one of her 43 year old students died in a recent car crash which has been difficult for her and many of the kids at school.  She seems to be dealing with it okay.  She denies significant depression thoughts of self-harm or suicide anhedonia or crying spells.  Most of the time she sleeps well.  She is to had difficulty getting insurance to pay for weight loss medicine but she is going to the gym. Visit Diagnosis:    ICD-10-CM   1. Moderate single current episode of major depressive disorder (HCC)  F32.1 venlafaxine  XR (EFFEXOR -XR)  150 MG 24 hr capsule      Past Psychiatric History: none  Past Medical History:  Past Medical History:  Diagnosis Date   Acne    facial   Arthritis    Depression    Phreesia 05/24/2020   High cholesterol    Hypertension    Migraines    Nicotine addiction    Obesity     Past Surgical History:  Procedure Laterality Date   CHOLECYSTECTOMY     COLONOSCOPY  10/2009   Dr. Shaaron; minimal anal canal hemorrhoids, otherwise normal rectum and colon.  Recommended repeat at age 64 due to family history.    Family Psychiatric History: See below  Family History:  Family History  Problem Relation Age of Onset   Cancer Paternal Grandfather        stomach   Emphysema Paternal Grandmother    Hypertension Maternal Grandmother    Diabetes Maternal Grandmother    Other Maternal Grandmother        has a pacemaker   Heart attack Maternal Grandfather    Diabetes Maternal Grandfather    Hypertension Father    Diabetes Father    Hyperlipidemia Father    Obesity Father    Hypertension Mother    Cancer Mother 24       colon   Obesity Mother    Diabetes Mother    Other Brother        MVA    Social History:  Social History  Socioeconomic History   Marital status: Significant Other    Spouse name: Not on file   Number of children: 0   Years of education: Bachelors   Highest education level: Bachelor's degree (e.g., BA, AB, BS)  Occupational History   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   Right-handed.   Occasional use of caffeine .   Social Drivers of Corporate investment banker Strain: Low Risk  (11/12/2023)   Overall Financial Resource Strain (CARDIA)    Difficulty of Paying Living Expenses: Not hard at all  Food Insecurity: No Food Insecurity (11/12/2023)   Hunger Vital Sign    Worried About Running Out of Food in the Last Year: Never true    Ran Out of Food in the Last Year: Never true  Transportation Needs: No Transportation Needs (11/12/2023)   PRAPARE - Administrator, Civil Service (Medical): No    Lack of Transportation (Non-Medical): No  Physical Activity: Inactive (11/12/2023)   Exercise Vital Sign    Days of Exercise per Week: 0 days    Minutes of Exercise per Session: Not on file  Stress: Stress Concern Present (11/12/2023)   [employer] of Occupational Health - Occupational Stress Questionnaire    Feeling of Stress: Very much  Social Connections: Moderately Isolated (11/12/2023)   Social Connection and Isolation Panel    Frequency of Communication with Friends and Family: More than three times a week    Frequency of Social Gatherings with Friends and Family: Once a week    Attends Religious Services: Never    Database administrator or Organizations: No    Attends Engineer, structural: Not on file    Marital Status: Living with partner    Allergies: No Known Allergies  Metabolic Disorder Labs: Lab Results  Component Value Date   HGBA1C 5.6 11/18/2023   MPG 105 11/30/2018   MPG 103 11/06/2016   No results found for: PROLACTIN Lab Results  Component Value Date   CHOL 171 11/18/2023   TRIG 182 (H) 11/18/2023   HDL 65 11/18/2023   CHOLHDL 2.6 11/18/2023   VLDL 31 (H) 11/06/2016   LDLCALC 76 11/18/2023   LDLCALC 92 05/01/2023   Lab Results  Component Value Date   TSH 2.510 11/18/2023   TSH 1.780 01/16/2023    Therapeutic Level Labs: No results found for:  LITHIUM No results found for: VALPROATE No results found for: CBMZ  Current Medications: Current Outpatient Medications  Medication Sig Dispense Refill   ezetimibe  (ZETIA ) 10 MG tablet Take 1 tablet by mouth once daily 90 tablet 0   Glucosamine HCl (GLUCOSAMINE PO) Take by mouth daily.     ibuprofen  (ADVIL ) 800 MG tablet TAKE 1 TABLET BY MOUTH TWICE DAILY AS NEEDED FOR HEADACHE (MAX  USE  OF  TWICE  PER  WEEK) 30 tablet 0   loratadine  (CLARITIN ) 10 MG tablet TAKE ONE TABLET BY MOUTH ONCE DAILY 90 tablet 3   Norethindrone-Ethinyl Estradiol-Fe Biphas (LO LOESTRIN FE ) 1 MG-10 MCG / 10 MCG tablet Take 1 tablet by mouth daily. Take 1 daily by mouth 84 tablet 3   propranolol  (INDERAL ) 20 MG tablet Take 1 tablet by mouth twice daily 180 tablet 0   rizatriptan  (MAXALT -MLT) 10 MG disintegrating tablet Take 1 tablet (10 mg total) by mouth as needed for migraine. May repeat in 2 hours if needed 10 tablet 2   rosuvastatin  (CRESTOR ) 5 MG tablet Take 1 tablet by mouth once daily 90 tablet 0   spironolactone  (ALDACTONE ) 25 MG tablet Take 1 tablet (25 mg total) by mouth daily. 90 tablet 3   venlafaxine  XR (EFFEXOR -XR) 150 MG 24 hr capsule TAKE 2 CAPSULES BY MOUTH ONCE DAILY WITH BREAKFAST 180 capsule 2   vortioxetine  HBr (TRINTELLIX ) 10 MG TABS tablet Take 1 tablet (10 mg total) by mouth daily. 90 tablet 2   vortioxetine  HBr (TRINTELLIX ) 20 MG TABS tablet Take 1 tablet (20 mg total) by mouth daily. 90 tablet 2   No current facility-administered medications for this visit.     Musculoskeletal: Strength & Muscle Tone: within normal limits Gait & Station: normal Patient leans: N/A  Psychiatric Specialty Exam: Review of Systems  All other systems reviewed and are negative.   There were no vitals taken for this visit.There is no height or weight on file to calculate BMI.  General Appearance: Casual and Fairly Groomed  Eye Contact:  Good  Speech:  Clear and Coherent  Volume:  Normal  Mood:   Euthymic  Affect:  Congruent  Thought Process:  Goal Directed  Orientation:  Full (Time, Place, and Person)  Thought Content: WDL   Suicidal Thoughts:  No  Homicidal Thoughts:  No  Memory:  Immediate;   Good Recent;   Good Remote;   Fair  Judgement:  Good  Insight:  Good  Psychomotor Activity:  Normal  Concentration:  Concentration: Good and Attention Span: Good  Recall:  Good  Fund of Knowledge: Good  Language: Good  Akathisia:  No  Handed:  Right  AIMS (if indicated): not done  Assets:  Communication Skills Desire for Improvement Physical Health Resilience Social Support Talents/Skills Vocational/Educational  ADL's:  Intact  Cognition: WNL  Sleep:  Good   Screenings: GAD-7    Flowsheet Row Office Visit from 11/18/2023 in [facility] Office Visit from 07/18/2023 in [facility] Office Visit from 01/20/2023 in [facility] at [facility] Office Visit from 01/16/2023 in [facility] Office Visit from 08/02/2019 in [facility]  Total GAD-7 Score 10 5 4 4 9    PHQ2-9    Flowsheet Row Office Visit from 11/18/2023 in [facility] Office Visit from 07/18/2023 in [facility] Office Visit from 01/20/2023 in [facility] at [facility] Office Visit from 01/16/2023 in [facility] Office Visit from 01/08/2022 in [facility]  PHQ-2 Total Score 4 2 3 2 1   PHQ-9 Total Score 12 7 11 13  --   Flowsheet Row UC from 01/08/2023 in [facility] at [facility] Video Visit from 07/16/2021 in [facility] [place] Video Visit from 07/26/2020 in  [facility] [place]  C-SSRS RISK CATEGORY No Risk No Risk No Risk     Assessment and Plan:  This patient is a 43 year old  female with a history of depression.  She continues to do well on her current regimen.  She will continue Trintellix  30 mg daily as well as Effexor  XR 300 mg daily for major depression.  She will return to see me in 6 months Collaboration of Care: Collaboration of Care: Primary Care Provider AEB notes are shared with PCP on the epic system  Patient/Guardian was advised Release of Information must be obtained prior to any record release in order to collaborate their care with an outside provider. Patient/Guardian was advised if they have not already done so to contact the registration department to sign all necessary forms in order for us  to release information regarding their care.   Consent: Patient/Guardian gives verbal consent for treatment and assignment of benefits for services provided during this visit. Patient/Guardian expressed understanding and agreed to proceed.    Claire Gull, MD 02/05/2024, 3:55 PM

## 2024-02-13 ENCOUNTER — Ambulatory Visit: Payer: Self-pay

## 2024-02-13 NOTE — Telephone Encounter (Signed)
Mailed normal results to pt

## 2024-03-12 ENCOUNTER — Other Ambulatory Visit: Payer: Self-pay | Admitting: Adult Health

## 2024-03-12 ENCOUNTER — Encounter: Payer: Self-pay | Admitting: Family Medicine

## 2024-03-12 ENCOUNTER — Ambulatory Visit: Admitting: Family Medicine

## 2024-03-12 VITALS — BP 134/86 | HR 75 | Resp 18 | Ht 63.0 in | Wt 368.1 lb

## 2024-03-12 DIAGNOSIS — Z23 Encounter for immunization: Secondary | ICD-10-CM

## 2024-03-12 DIAGNOSIS — M25561 Pain in right knee: Secondary | ICD-10-CM | POA: Diagnosis not present

## 2024-03-12 DIAGNOSIS — Z6841 Body Mass Index (BMI) 40.0 and over, adult: Secondary | ICD-10-CM

## 2024-03-12 DIAGNOSIS — I10 Essential (primary) hypertension: Secondary | ICD-10-CM | POA: Diagnosis not present

## 2024-03-12 DIAGNOSIS — E559 Vitamin D deficiency, unspecified: Secondary | ICD-10-CM

## 2024-03-12 DIAGNOSIS — Z1211 Encounter for screening for malignant neoplasm of colon: Secondary | ICD-10-CM

## 2024-03-12 DIAGNOSIS — E785 Hyperlipidemia, unspecified: Secondary | ICD-10-CM

## 2024-03-12 DIAGNOSIS — F322 Major depressive disorder, single episode, severe without psychotic features: Secondary | ICD-10-CM

## 2024-03-12 MED ORDER — PHENTERMINE HCL 37.5 MG PO TABS
37.5000 mg | ORAL_TABLET | Freq: Every day | ORAL | 2 refills | Status: AC
Start: 2024-03-12 — End: ?

## 2024-03-12 MED ORDER — PREDNISONE 20 MG PO TABS: 20.0000 mg | ORAL_TABLET | Freq: Two times a day (BID) | ORAL | 0 refills | Status: AC

## 2024-03-12 MED ORDER — MELOXICAM 15 MG PO TABS: ORAL_TABLET | ORAL | 0 refills | Status: AC

## 2024-03-12 NOTE — Patient Instructions (Addendum)
 Annual exam in 3 months  Flu vaccine today  Short term for right knee pain is prednisone  ansd meloxicam  Phentermine  one daily is prescribed to help with weigth loss, eat regularly, mainly vegetables, fruit, beans and white meat  Ice knee , let it recover and return to gym in 10 days  Fasting lipid, cmp and EGFr, TSH to be obtained 1 week before next appt  NEED to get colonoscopy, pls follow through!!!  Think about what you will eat, plan ahead. Choose  clean, green, fresh or frozen over canned, processed or packaged foods which are more sugary, salty and fatty. 70 to 75% of food eaten should be vegetables and fruit. Three meals at set times with snacks allowed between meals, but they must be fruit or vegetables. Aim to eat over a 12 hour period , example 7 am to 7 pm, and STOP after  your last meal of the day. Drink water,generally about 64 ounces per day, no other drink is as healthy. Fruit juice is best enjoyed in a healthy way, by EATING the fruit.  Thanks for choosing [facility], we consider it a privelige to serve you.

## 2024-03-14 ENCOUNTER — Telehealth: Admitting: Physician Assistant

## 2024-03-14 ENCOUNTER — Encounter: Payer: Self-pay | Admitting: Family Medicine

## 2024-03-14 DIAGNOSIS — B9689 Other specified bacterial agents as the cause of diseases classified elsewhere: Secondary | ICD-10-CM

## 2024-03-14 DIAGNOSIS — B379 Candidiasis, unspecified: Secondary | ICD-10-CM

## 2024-03-14 DIAGNOSIS — J019 Acute sinusitis, unspecified: Secondary | ICD-10-CM | POA: Diagnosis not present

## 2024-03-14 DIAGNOSIS — M25561 Pain in right knee: Secondary | ICD-10-CM | POA: Insufficient documentation

## 2024-03-14 DIAGNOSIS — T3695XA Adverse effect of unspecified systemic antibiotic, initial encounter: Secondary | ICD-10-CM | POA: Diagnosis not present

## 2024-03-14 DIAGNOSIS — Z23 Encounter for immunization: Secondary | ICD-10-CM | POA: Insufficient documentation

## 2024-03-14 MED ORDER — FLUTICASONE PROPIONATE 50 MCG/ACT NA SUSP: 2.0000 | Freq: Every day | NASAL | 0 refills | Status: DC

## 2024-03-14 MED ORDER — FLUCONAZOLE 150 MG PO TABS: 150.0000 mg | ORAL_TABLET | ORAL | 0 refills | Status: DC | PRN

## 2024-03-14 MED ORDER — AMOXICILLIN-POT CLAVULANATE 875-125 MG PO TABS: 1.0000 | ORAL_TABLET | Freq: Two times a day (BID) | ORAL | 0 refills | Status: DC

## 2024-03-14 NOTE — Progress Notes (Signed)
 Virtual Visit Consent   CHERYN LUNDQUIST, you are scheduled for a virtual visit with a [facility] provider today. Just as with appointments in the office, your consent must be obtained to participate. Your consent will be active for this visit and any virtual visit you may have with one of our providers in the next 365 days. If you have a MyChart account, a copy of this consent can be sent to you electronically.  As this is a virtual visit, video technology does not allow for your provider to perform a traditional examination. This may limit your provider's ability to fully assess your condition. If your provider identifies any concerns that need to be evaluated in person or the need to arrange testing (such as labs, EKG, etc.), we will make arrangements to do so. Although advances in technology are sophisticated, we cannot ensure that it will always work on either your end or our end. If the connection with a video visit is poor, the visit may have to be switched to a telephone visit. With either a video or telephone visit, we are not always able to ensure that we have a secure connection.  By engaging in this virtual visit, you consent to the provision of healthcare and authorize for your insurance to be billed (if applicable) for the services provided during this visit. Depending on your insurance coverage, you may receive a charge related to this service.  I need to obtain your verbal consent now. Are you willing to proceed with your visit today? Claire [name] has provided verbal consent on 03/14/2024 for a virtual visit (video or telephone). [name], PA-C  Date: 03/14/2024 11:39 AM   Virtual Visit via Video Note   I, [name], connected with  Claire [name]  ([id number], 1980/06/09) on 03/14/24 at 11:45 AM EST by a video-enabled telemedicine application and verified that I am speaking with the correct person using two identifiers.  Location: Patient: Virtual Visit Location  Patient: Home Provider: Virtual Visit Location Provider: Home Office   I discussed the limitations of evaluation and management by telemedicine and the availability of in person appointments. The patient expressed understanding and agreed to proceed.    History of Present Illness: Claire [name] is a 43 y.o. who identifies as a female who was assigned female at birth, and is being seen today for sinus congestion and cough.  HPI: URI  This is a new problem. The current episode started in the past 7 days. The problem has been gradually worsening. Maximum temperature: hot and cold flashes yesterday, subjective. The fever has been present for Less than 1 day. Associated symptoms include congestion, coughing (productive of discolored mucus), headaches, a plugged ear sensation (with blowing nose), rhinorrhea, sinus pain and a sore throat. Pertinent negatives include no chest pain, diarrhea, ear pain, nausea, vomiting or wheezing. Treatments tried: mucinex. The treatment provided no relief.     Problems:  Patient Active Problem List   Diagnosis Date Noted   OSA (obstructive sleep apnea) 11/18/2023   GAD (generalized anxiety disorder) 11/18/2023   Vitamin D  deficiency 11/18/2023   Tremor 07/21/2023   Encounter for surveillance of contraceptive pills 02/24/2023   Essential hypertension 01/20/2023   Prediabetes 01/20/2023   Metabolic syndrome X 01/20/2023   Irregular bleeding 01/20/2023   Pelvic pain 01/20/2023   Periodic health assessment, Pap and pelvic 01/08/2022   Low back pain with left-sided sciatica 10/07/2020   Chronic hip pain, right 08/23/2020   Family  history of colon cancer 11/08/2017   Colon cancer screening 11/07/2016   Annual physical exam 12/05/2015   Chronic migraine 09/19/2015   Heel spur 03/10/2015   Severe major depression without psychotic features (HCC) 07/28/2014   Dyslipidemia, goal LDL below 100 07/11/2013   INTERNAL HEMORRHOIDS WITH OTHER COMPLICATION 10/04/2009    Morbid obesity with BMI of 60.0-69.9, adult (HCC) 09/23/2007    Allergies: No Known Allergies Medications:  Current Outpatient Medications:    amoxicillin -clavulanate (AUGMENTIN ) 875-125 MG tablet, Take 1 tablet by mouth 2 (two) times daily., Disp: 14 tablet, Rfl: 0   fluconazole  (DIFLUCAN ) 150 MG tablet, Take 1 tablet (150 mg total) by mouth every 3 (three) days as needed., Disp: 3 tablet, Rfl: 0   fluticasone (FLONASE) 50 MCG/ACT nasal spray, Place 2 sprays into both nostrils daily., Disp: 16 g, Rfl: 0   ezetimibe  (ZETIA ) 10 MG tablet, Take 1 tablet by mouth once daily, Disp: 90 tablet, Rfl: 0   Glucosamine HCl (GLUCOSAMINE PO), Take by mouth daily., Disp: , Rfl:    ibuprofen  (ADVIL ) 800 MG tablet, TAKE 1 TABLET BY MOUTH TWICE DAILY AS NEEDED FOR HEADACHE (MAX  USE  OF  TWICE  PER  WEEK), Disp: 30 tablet, Rfl: 0   loratadine  (CLARITIN ) 10 MG tablet, TAKE ONE TABLET BY MOUTH ONCE DAILY, Disp: 90 tablet, Rfl: 3   meloxicam (MOBIC) 15 MG tablet, Take one tablet by mouth once daily for 5 days then one tablet once daily for uncontrolled arthritic pain as needed, Disp: 30 tablet, Rfl: 0   Norethindrone-Ethinyl Estradiol-Fe Biphas (LO LOESTRIN FE ) 1 MG-10 MCG / 10 MCG tablet, Take 1 tablet by mouth daily. Take 1 daily by mouth, Disp: 84 tablet, Rfl: 3   phentermine  (ADIPEX-P ) 37.5 MG tablet, Take 1 tablet (37.5 mg total) by mouth daily before breakfast., Disp: 30 tablet, Rfl: 2   predniSONE  (DELTASONE ) 20 MG tablet, Take 1 tablet (20 mg total) by mouth 2 (two) times daily with a meal., Disp: 10 tablet, Rfl: 0   propranolol  (INDERAL ) 20 MG tablet, Take 1 tablet by mouth twice daily, Disp: 180 tablet, Rfl: 0   rizatriptan  (MAXALT -MLT) 10 MG disintegrating tablet, Take 1 tablet (10 mg total) by mouth as needed for migraine. May repeat in 2 hours if needed, Disp: 10 tablet, Rfl: 2   rosuvastatin  (CRESTOR ) 5 MG tablet, Take 1 tablet by mouth once daily, Disp: 90 tablet, Rfl: 0   spironolactone  (ALDACTONE )  25 MG tablet, Take 1 tablet (25 mg total) by mouth daily., Disp: 90 tablet, Rfl: 3   venlafaxine  XR (EFFEXOR -XR) 150 MG 24 hr capsule, TAKE 2 CAPSULES BY MOUTH ONCE DAILY WITH BREAKFAST, Disp: 180 capsule, Rfl: 2   vortioxetine  HBr (TRINTELLIX ) 10 MG TABS tablet, Take 1 tablet (10 mg total) by mouth daily., Disp: 90 tablet, Rfl: 2   vortioxetine  HBr (TRINTELLIX ) 20 MG TABS tablet, Take 1 tablet (20 mg total) by mouth daily., Disp: 90 tablet, Rfl: 2  Observations/Objective: Patient is well-developed, well-nourished in no acute distress.  Resting comfortably at home.  Head is normocephalic, atraumatic.  No labored breathing.  Speech is clear and coherent with logical content.  Patient is alert and oriented at baseline.    Assessment and Plan: 1. Acute bacterial sinusitis (Primary) - amoxicillin -clavulanate (AUGMENTIN ) 875-125 MG tablet; Take 1 tablet by mouth 2 (two) times daily.  Dispense: 14 tablet; Refill: 0 - fluticasone (FLONASE) 50 MCG/ACT nasal spray; Place 2 sprays into both nostrils daily.  Dispense: 16 g; Refill: 0  2. Antibiotic-induced yeast infection - fluconazole  (DIFLUCAN ) 150 MG tablet; Take 1 tablet (150 mg total) by mouth every 3 (three) days as needed.  Dispense: 3 tablet; Refill: 0  - Worsening symptoms that have not responded to OTC medications.  - Will give Augmentin  and Flonase - Continue allergy medications.  - Steam and humidifier can help - Stay well hydrated and get plenty of rest.  - Diflucan  given as prophylaxis as patient tends to get vaginal yeast infections with antibiotic use - Seek in person evaluation if no symptom improvement or if symptoms worsen   Follow Up Instructions: I discussed the assessment and treatment plan with the patient. The patient was provided an opportunity to ask questions and all were answered. The patient agreed with the plan and demonstrated an understanding of the instructions.  A copy of instructions were sent to the patient  via MyChart unless otherwise noted below.    The patient was advised to call back or seek an in-person evaluation if the symptoms worsen or if the condition fails to improve as anticipated.    [name], PA-C

## 2024-03-14 NOTE — Assessment & Plan Note (Signed)
 Updated lab needed at/ before next visit.

## 2024-03-14 NOTE — Assessment & Plan Note (Signed)
 Stable, and,managed by Psych

## 2024-03-14 NOTE — Patient Instructions (Signed)
 Claire [name], thank you for joining Claire CHRISTELLA Dickinson, PA-C for today's virtual visit.  While this provider is not your primary care provider (PCP), if your PCP is located in our provider database this encounter information will be shared with them immediately following your visit.   A [facility] MyChart account gives you access to today's visit and all your visits, tests, and labs performed at [facility]  click here if you don't have a [facility] MyChart account or go to [email or web address]  Consent: (Patient) Claire [name] provided verbal consent for this virtual visit at the beginning of the encounter.  Current Medications:  Current Outpatient Medications:    amoxicillin -clavulanate (AUGMENTIN ) 875-125 MG tablet, Take 1 tablet by mouth 2 (two) times daily., Disp: 14 tablet, Rfl: 0   fluconazole  (DIFLUCAN ) 150 MG tablet, Take 1 tablet (150 mg total) by mouth every 3 (three) days as needed., Disp: 3 tablet, Rfl: 0   fluticasone (FLONASE) 50 MCG/ACT nasal spray, Place 2 sprays into both nostrils daily., Disp: 16 g, Rfl: 0   ezetimibe  (ZETIA ) 10 MG tablet, Take 1 tablet by mouth once daily, Disp: 90 tablet, Rfl: 0   Glucosamine HCl (GLUCOSAMINE PO), Take by mouth daily., Disp: , Rfl:    ibuprofen  (ADVIL ) 800 MG tablet, TAKE 1 TABLET BY MOUTH TWICE DAILY AS NEEDED FOR HEADACHE (MAX  USE  OF  TWICE  PER  WEEK), Disp: 30 tablet, Rfl: 0   loratadine  (CLARITIN ) 10 MG tablet, TAKE ONE TABLET BY MOUTH ONCE DAILY, Disp: 90 tablet, Rfl: 3   meloxicam (MOBIC) 15 MG tablet, Take one tablet by mouth once daily for 5 days then one tablet once daily for uncontrolled arthritic pain as needed, Disp: 30 tablet, Rfl: 0   Norethindrone-Ethinyl Estradiol-Fe Biphas (LO LOESTRIN FE ) 1 MG-10 MCG / 10 MCG tablet, Take 1 tablet by mouth daily. Take 1 daily by mouth, Disp: 84 tablet, Rfl: 3   phentermine  (ADIPEX-P ) 37.5 MG tablet, Take 1 tablet (37.5 mg total) by mouth daily before breakfast.,  Disp: 30 tablet, Rfl: 2   predniSONE  (DELTASONE ) 20 MG tablet, Take 1 tablet (20 mg total) by mouth 2 (two) times daily with a meal., Disp: 10 tablet, Rfl: 0   propranolol  (INDERAL ) 20 MG tablet, Take 1 tablet by mouth twice daily, Disp: 180 tablet, Rfl: 0   rizatriptan  (MAXALT -MLT) 10 MG disintegrating tablet, Take 1 tablet (10 mg total) by mouth as needed for migraine. May repeat in 2 hours if needed, Disp: 10 tablet, Rfl: 2   rosuvastatin  (CRESTOR ) 5 MG tablet, Take 1 tablet by mouth once daily, Disp: 90 tablet, Rfl: 0   spironolactone  (ALDACTONE ) 25 MG tablet, Take 1 tablet (25 mg total) by mouth daily., Disp: 90 tablet, Rfl: 3   venlafaxine  XR (EFFEXOR -XR) 150 MG 24 hr capsule, TAKE 2 CAPSULES BY MOUTH ONCE DAILY WITH BREAKFAST, Disp: 180 capsule, Rfl: 2   vortioxetine  HBr (TRINTELLIX ) 10 MG TABS tablet, Take 1 tablet (10 mg total) by mouth daily., Disp: 90 tablet, Rfl: 2   vortioxetine  HBr (TRINTELLIX ) 20 MG TABS tablet, Take 1 tablet (20 mg total) by mouth daily., Disp: 90 tablet, Rfl: 2   Medications ordered in this encounter:  Meds ordered this encounter  Medications   amoxicillin -clavulanate (AUGMENTIN ) 875-125 MG tablet    Sig: Take 1 tablet by mouth 2 (two) times daily.    Dispense:  14 tablet    Refill:  0    Supervising Provider:   BLAISE ALEENE KIDD [  [id number]]   fluticasone (FLONASE) 50 MCG/ACT nasal spray    Sig: Place 2 sprays into both nostrils daily.    Dispense:  16 g    Refill:  0    Supervising Provider:   BLAISE ALEENE KIDD [[id number]]   fluconazole  (DIFLUCAN ) 150 MG tablet    Sig: Take 1 tablet (150 mg total) by mouth every 3 (three) days as needed.    Dispense:  3 tablet    Refill:  0    Supervising Provider:   LAMPTEY, PHILIP O [[id number]]     *If you need refills on other medications prior to your next appointment, please contact your pharmacy*  Follow-Up: Call back or seek an in-person evaluation if the symptoms worsen or if the condition fails to improve as  anticipated.  [facility] [phone]  Other Instructions Sinus Infection, Adult A sinus infection, also called sinusitis, is inflammation of your sinuses. Sinuses are hollow spaces in the bones around your face. Your sinuses are located: Around your eyes. In the middle of your forehead. Behind your nose. In your cheekbones. Mucus normally drains out of your sinuses. When your nasal tissues become inflamed or swollen, mucus can become trapped or blocked. This allows bacteria, viruses, and fungi to grow, which leads to infection. Most infections of the sinuses are caused by a virus. A sinus infection can develop quickly. It can last for up to 4 weeks (acute) or for more than 12 weeks (chronic). A sinus infection often develops after a cold. What are the causes? This condition is caused by anything that creates swelling in the sinuses or stops mucus from draining. This includes: Allergies. Asthma. Infection from bacteria or viruses. Deformities or blockages in your nose or sinuses. Abnormal growths in the nose (nasal polyps). Pollutants, such as chemicals or irritants in the air. Infection from fungi. This is rare. What increases the risk? You are more likely to develop this condition if you: Have a weak body defense system (immune system). Do a lot of swimming or diving. Overuse nasal sprays. Smoke. What are the signs or symptoms? The main symptoms of this condition are pain and a feeling of pressure around the affected sinuses. Other symptoms include: Stuffy nose or congestion that makes it difficult to breathe through your nose. Thick yellow or greenish drainage from your nose. Tenderness, swelling, and warmth over the affected sinuses. A cough that may get worse at night. Decreased sense of smell and taste. Extra mucus that collects in the throat or the back of the nose (postnasal drip) causing a sore throat or bad breath. Tiredness (fatigue). Fever. How is  this diagnosed? This condition is diagnosed based on: Your symptoms. Your medical history. A physical exam. Tests to find out if your condition is acute or chronic. This may include: Checking your nose for nasal polyps. Viewing your sinuses using a device that has a light (endoscope). Testing for allergies or bacteria. Imaging tests, such as an MRI or CT scan. In rare cases, a bone biopsy may be done to rule out more serious types of fungal sinus disease. How is this treated? Treatment for a sinus infection depends on the cause and whether your condition is chronic or acute. If caused by a virus, your symptoms should go away on their own within 10 days. You may be given medicines to relieve symptoms. They include: Medicines that shrink swollen nasal passages (decongestants). A spray that eases inflammation of the nostrils (topical intranasal corticosteroids). Rinses that help  get rid of thick mucus in your nose (nasal saline washes). Medicines that treat allergies (antihistamines). Over-the-counter pain relievers. If caused by bacteria, your health care provider may recommend waiting to see if your symptoms improve. Most bacterial infections will get better without antibiotic medicine. You may be given antibiotics if you have: A severe infection. A weak immune system. If caused by narrow nasal passages or nasal polyps, surgery may be needed. Follow these instructions at home: Medicines Take, use, or apply over-the-counter and prescription medicines only as told by your health care provider. These may include nasal sprays. If you were prescribed an antibiotic medicine, take it as told by your health care provider. Do not stop taking the antibiotic even if you start to feel better. Hydrate and humidify  Drink enough fluid to keep your urine pale yellow. Staying hydrated will help to thin your mucus. Use a cool mist humidifier to keep the humidity level in your home above 50%. Inhale  steam for 10-15 minutes, 3-4 times a day, or as told by your health care provider. You can do this in the bathroom while a hot shower is running. Limit your exposure to cool or dry air. Rest Rest as much as possible. Sleep with your head raised (elevated). Make sure you get enough sleep each night. General instructions  Apply a warm, moist washcloth to your face 3-4 times a day or as told by your health care provider. This will help with discomfort. Use nasal saline washes as often as told by your health care provider. Wash your hands often with soap and water to reduce your exposure to germs. If soap and water are not available, use hand sanitizer. Do not smoke. Avoid being around people who are smoking (secondhand smoke). Keep all follow-up visits. This is important. Contact a health care provider if: You have a fever. Your symptoms get worse. Your symptoms do not improve within 10 days. Get help right away if: You have a severe headache. You have persistent vomiting. You have severe pain or swelling around your face or eyes. You have vision problems. You develop confusion. Your neck is stiff. You have trouble breathing. These symptoms may be an emergency. Get help right away. Call 911. Do not wait to see if the symptoms will go away. Do not drive yourself to the hospital. Summary A sinus infection is soreness and inflammation of your sinuses. Sinuses are hollow spaces in the bones around your face. This condition is caused by nasal tissues that become inflamed or swollen. The swelling traps or blocks the flow of mucus. This allows bacteria, viruses, and fungi to grow, which leads to infection. If you were prescribed an antibiotic medicine, take it as told by your health care provider. Do not stop taking the antibiotic even if you start to feel better. Keep all follow-up visits. This is important. This information is not intended to replace advice given to you by your health care  provider. Make sure you discuss any questions you have with your health care provider. Document Revised: 03/20/2021 Document Reviewed: 03/20/2021 Elsevier Patient Education  2024 Elsevier Inc.   If you have been instructed to have an in-person evaluation today at a local Urgent Care facility, please use the link below. It will take you to a list of all of our available [facility], including address, phone number and hours of operation. Please do not delay care.  [facility]  If you or a family member do not  have a primary care provider, use the link below to schedule a visit and establish care. When you choose a [facility] primary care physician or advanced practice provider, you gain a long-term partner in health. Find a Primary Care Provider  Learn more about [facility] in-office and virtual care options: [facility] Get Care Now

## 2024-03-14 NOTE — Assessment & Plan Note (Signed)
  Patient re-educated about  the importance of commitment to a  minimum of 150 minutes of exercise per week as able.  The importance of healthy food choices with portion control discussed, as well as eating regularly and within a 12 hour window most days. The need to choose clean , green food 50 to 75% of the time is discussed, as well as to make water the primary drink and set a goal of 64 ounces water daily.       03/12/2024    3:53 PM 11/18/2023    9:41 AM 07/18/2023    3:49 PM  Weight /BMI  Weight 368 lb 1.3 oz 377 lb 1.3 oz 378 lb 6.4 oz  Height 5' 3 (1.6 m) 5' 3 (1.6 m) 5' 3 (1.6 m)  BMI 65.2 kg/m2 66.8 kg/m2 67.03 kg/m2    Improved , resume exercise in 2 weeks Phentermine  prescribed with 3 month follow up

## 2024-03-14 NOTE — Assessment & Plan Note (Signed)
 After obtaining informed consent, the vaccine is  administered , with no adverse effect noted at the time of administration.

## 2024-03-14 NOTE — Assessment & Plan Note (Signed)
 Short course of prednisone  and meloxicam , ice and rest x 10 to 14 days

## 2024-03-14 NOTE — Progress Notes (Signed)
   Claire [name]     MRN: [id number]      DOB: October 03, 1980  Chief Complaint  Patient presents with   Hypertension    Follow up    Knee Pain    Pt complains of right knee pain x1 month. Pain increases at night. No known injury     HPI Claire [name] is here for follow up and re-evaluation of chronic medical conditions, medication management and review of any available recent lab and radiology data.  Preventive health is updated, specifically  Cancer screening and Immunization.   Needs to follow through on colonoscopy which is past due Has been losing wight having started regular exercise , unfortunately injured knee and has had to stop ROS Denies recent fever or chills. Denies sinus pressure, nasal congestion, ear pain or sore throat. Denies chest congestion, productive cough or wheezing. Denies chest pains, palpitations and leg swelling Denies abdominal pain, nausea, vomiting,diarrhea or constipation.   Denies dysuria, frequency, hesitancy or incontinence.  Denies headaches, seizures, numbness, or tingling. Denies uncontrolled depression, anxiety or insomnia. Denies skin break down or rash.   PE  BP 134/86   Pulse 75   Resp 18   Ht 5' 3 (1.6 m)   Wt (!) 368 lb 1.3 oz (167 kg)   SpO2 96%   BMI 65.20 kg/m   Patient alert and oriented and in no cardiopulmonary distress.  HEENT: No facial asymmetry, EOMI,     Neck supple .  Chest: Clear to auscultation bilaterally.  CVS: S1, S2 no murmurs, no S3.Regular rate.  ABD: Soft non tender.   Ext: No edema  MS: Adequate ROM spine, shoulders, hips and reduced in right  knee which is swollen and tender with crepitus .  Skin: Intact, no ulcerations or rash noted.  Psych: Good eye contact, normal affect. Memory intact not anxious or depressed appearing.  CNS: CN 2-12 intact, power,  normal throughout.no focal deficits noted.   Assessment & Plan  Morbid obesity with BMI of 60.0-69.9, adult [facility])  Patient re-educated about  the  importance of commitment to a  minimum of 150 minutes of exercise per week as able.  The importance of healthy food choices with portion control discussed, as well as eating regularly and within a 12 hour window most days. The need to choose clean , green food 50 to 75% of the time is discussed, as well as to make water the primary drink and set a goal of 64 ounces water daily.       03/12/2024    3:53 PM 11/18/2023    9:41 AM 07/18/2023    3:49 PM  Weight /BMI  Weight 368 lb 1.3 oz 377 lb 1.3 oz 378 lb 6.4 oz  Height 5' 3 (1.6 m) 5' 3 (1.6 m) 5' 3 (1.6 m)  BMI 65.2 kg/m2 66.8 kg/m2 67.03 kg/m2    Improved , resume exercise in 2 weeks Phentermine  prescribed with 3 month follow up  Acute pain of right knee Short course of prednisone  and meloxicam , ice and rest x 10 to 14 days  Severe major depression without psychotic features (HCC) Stable, and,managed by Psych  Vitamin D  deficiency Updated lab needed at/ before next visit.   Influenza vaccination administered at current visit After obtaining informed consent, the vaccine is  administered , with no adverse effect noted at the time of administration.

## 2024-03-15 ENCOUNTER — Telehealth: Payer: Self-pay | Admitting: Pharmacy Technician

## 2024-03-15 ENCOUNTER — Other Ambulatory Visit (HOSPITAL_COMMUNITY): Payer: Self-pay

## 2024-03-15 NOTE — Telephone Encounter (Signed)
 Pharmacy Patient Advocate Encounter   Received notification from Onbase that prior authorization for Phentermine  HCl 37.5MG  tablets is required/requested.   Insurance verification completed.   The patient is insured through [employer].   Per test claim: PA required; PA submitted to above mentioned insurance via Latent Key/confirmation #/EOC [id number] Status is pending

## 2024-03-16 ENCOUNTER — Encounter (INDEPENDENT_AMBULATORY_CARE_PROVIDER_SITE_OTHER): Payer: Self-pay | Admitting: *Deleted

## 2024-03-16 ENCOUNTER — Other Ambulatory Visit (HOSPITAL_COMMUNITY): Payer: Self-pay

## 2024-03-16 NOTE — Telephone Encounter (Signed)
 Pharmacy Patient Advocate Encounter  Received notification from [facility] that Prior Authorization for Phentermine  HCl 37.5MG  tablets has been APPROVED from 03/15/2024 to 06/15/2024. Ran test claim, Copay is $2.60. This test claim was processed through [facility]- copay amounts may vary at other pharmacies due to pharmacy/plan contracts, or as the patient moves through the different stages of their insurance plan.   PA #/Case ID/Reference #: [id number]

## 2024-04-08 ENCOUNTER — Telehealth: Payer: Self-pay | Admitting: *Deleted

## 2024-04-08 DIAGNOSIS — Z1211 Encounter for screening for malignant neoplasm of colon: Secondary | ICD-10-CM

## 2024-04-08 NOTE — Telephone Encounter (Signed)
 Procedure: COLONOSCOPY   Estimated body mass index is 65.2 kg/m as calculated from the following:   Height as of 03/12/24: 5' 3 (1.6 m).   Weight as of 03/12/24: 368 lb 1.3 oz (167 kg).  Have you had a colonoscopy before?  10/2009, Dr. Shaaron  Do you have family history of colon cancer?  YES MOTHER  Do you have a family history of polyps? NO  Previous colonoscopy with polyps removed? NO  Do you have a history colorectal cancer?   NO  Are you diabetic?  NO  Do you have a prosthetic or mechanical heart valve? NO  Do you have a pacemaker/defibrillator?   NO  Have you had endocarditis/atrial fibrillation?  NO  Do you use supplemental oxygen/CPAP?  NO  Have you had joint replacement within the last 12 months?  NO  Do you tend to be constipated or have to use laxatives?  NO   Do you have history of alcohol use? If yes, how much and how often.  NO  Do you have history or are you using drugs? If yes, what do are you  using?  NO  Have you ever had a stroke/heart attack?  NO  Have you ever had a heart or other vascular stent placed,?NO  Do you take weight loss medication? YES  female patients,: have you had a hysterectomy? NO                              are you post menopausal?  NO                              do you still have your menstrual cycle? YES    Date of last menstrual period? 03/16/2024  Do you take any blood-thinning medications such as: (Plavix, aspirin, Coumadin, Aggrenox, Brilinta, Xarelto, Eliquis, Pradaxa, Savaysa or Effient)? NO  If yes we need the name, milligram, dosage and who is prescribing doctor:               Current Outpatient Medications  Medication Sig Dispense Refill   ezetimibe  (ZETIA ) 10 MG tablet Take 1 tablet by mouth once daily 90 tablet 0   Glucosamine HCl (GLUCOSAMINE PO) Take by mouth daily.     ibuprofen  (ADVIL ) 800 MG tablet TAKE 1 TABLET BY MOUTH TWICE DAILY AS NEEDED FOR HEADACHE (MAX  USE  OF  TWICE  PER  WEEK) 30 tablet 0   LO  LOESTRIN  FE 1 MG-10 MCG / 10 MCG tablet Take 1 tablet by mouth once daily 84 tablet 0   loratadine  (CLARITIN ) 10 MG tablet TAKE ONE TABLET BY MOUTH ONCE DAILY 90 tablet 3   meloxicam  (MOBIC ) 15 MG tablet Take one tablet by mouth once daily for 5 days then one tablet once daily for uncontrolled arthritic pain as needed 30 tablet 0   phentermine  (ADIPEX-P ) 37.5 MG tablet Take 1 tablet (37.5 mg total) by mouth daily before breakfast. 30 tablet 2   predniSONE  (DELTASONE ) 20 MG tablet Take 1 tablet (20 mg total) by mouth 2 (two) times daily with a meal. 10 tablet 0   propranolol  (INDERAL ) 20 MG tablet Take 1 tablet by mouth twice daily 180 tablet 0   rizatriptan  (MAXALT -MLT) 10 MG disintegrating tablet Take 1 tablet (10 mg total) by mouth as needed for migraine. May repeat in 2 hours if needed 10 tablet 2  rosuvastatin  (CRESTOR ) 5 MG tablet Take 1 tablet by mouth once daily 90 tablet 0   spironolactone  (ALDACTONE ) 25 MG tablet Take 1 tablet (25 mg total) by mouth daily. 90 tablet 3   venlafaxine  XR (EFFEXOR -XR) 150 MG 24 hr capsule TAKE 2 CAPSULES BY MOUTH ONCE DAILY WITH BREAKFAST 180 capsule 2   vortioxetine  HBr (TRINTELLIX ) 10 MG TABS tablet Take 1 tablet (10 mg total) by mouth daily. 90 tablet 2   vortioxetine  HBr (TRINTELLIX ) 20 MG TABS tablet Take 1 tablet (20 mg total) by mouth daily. 90 tablet 2   No current facility-administered medications for this visit.    Allergies[1]      [1] No Known Allergies

## 2024-04-09 ENCOUNTER — Other Ambulatory Visit: Payer: Self-pay | Admitting: Family Medicine

## 2024-04-12 NOTE — Telephone Encounter (Signed)
LMOVM to call back to schedule 

## 2024-04-12 NOTE — Telephone Encounter (Signed)
 Spoke with Melanie in endo and she stated anesthesia said it would be fine to schedule at [facility]

## 2024-04-13 MED ORDER — PEG 3350-KCL-NA BICARB-NACL 420 G PO SOLR: 4000.0000 mL | Freq: Once | ORAL | 0 refills | Status: AC

## 2024-04-13 NOTE — Addendum Note (Signed)
 Addended by: JEANELL GRAEME RAMAN on: 04/13/2024 04:24 PM   Modules accepted: Orders

## 2024-04-13 NOTE — Telephone Encounter (Signed)
 Spoke with pt. She has been scheduled for 05/17/24. Aware will mail instructions and send rx for prep to pharmacy. Also aware will get pre-op phone call prior to procedure with arrival time.

## 2024-04-16 NOTE — Telephone Encounter (Signed)
 Referral completed, [facility] apt letter sent to PCP

## 2024-04-19 ENCOUNTER — Other Ambulatory Visit: Payer: Self-pay | Admitting: Family Medicine

## 2024-05-04 ENCOUNTER — Other Ambulatory Visit: Payer: Self-pay | Admitting: Family Medicine

## 2024-05-12 ENCOUNTER — Encounter (HOSPITAL_COMMUNITY)
Admission: RE | Admit: 2024-05-12 | Discharge: 2024-05-12 | Disposition: A | Discharge status: Home / Self Care | Source: Ambulatory Visit | Attending: Internal Medicine | Admitting: Internal Medicine

## 2024-05-12 ENCOUNTER — Encounter (HOSPITAL_COMMUNITY): Payer: Self-pay

## 2024-05-12 ENCOUNTER — Other Ambulatory Visit: Payer: Self-pay

## 2024-05-12 VITALS — Ht 63.0 in | Wt 368.2 lb

## 2024-05-12 DIAGNOSIS — Z01818 Encounter for other preprocedural examination: Secondary | ICD-10-CM

## 2024-05-17 ENCOUNTER — Encounter (HOSPITAL_COMMUNITY): Payer: Self-pay | Admitting: Internal Medicine

## 2024-05-17 ENCOUNTER — Ambulatory Visit (HOSPITAL_COMMUNITY)

## 2024-05-17 ENCOUNTER — Ambulatory Visit (HOSPITAL_COMMUNITY)
Admission: RE | Admit: 2024-05-17 | Discharge: 2024-05-17 | Disposition: A | Discharge status: Home / Self Care | Attending: Internal Medicine | Admitting: Internal Medicine

## 2024-05-17 ENCOUNTER — Encounter (HOSPITAL_COMMUNITY)
Admission: RE | Disposition: A | Discharge status: Home / Self Care | Payer: Self-pay | Source: Home / Self Care | Attending: Internal Medicine

## 2024-05-17 DIAGNOSIS — Z1211 Encounter for screening for malignant neoplasm of colon: Secondary | ICD-10-CM | POA: Diagnosis not present

## 2024-05-17 DIAGNOSIS — Z800 Family history of malignant neoplasm of digestive organs: Secondary | ICD-10-CM | POA: Insufficient documentation

## 2024-05-17 DIAGNOSIS — Z87891 Personal history of nicotine dependence: Secondary | ICD-10-CM | POA: Diagnosis not present

## 2024-05-17 DIAGNOSIS — E6689 Other obesity not elsewhere classified: Secondary | ICD-10-CM | POA: Insufficient documentation

## 2024-05-17 DIAGNOSIS — Z6841 Body Mass Index (BMI) 40.0 and over, adult: Secondary | ICD-10-CM | POA: Insufficient documentation

## 2024-05-17 DIAGNOSIS — M1990 Unspecified osteoarthritis, unspecified site: Secondary | ICD-10-CM | POA: Diagnosis not present

## 2024-05-17 DIAGNOSIS — I10 Essential (primary) hypertension: Secondary | ICD-10-CM | POA: Insufficient documentation

## 2024-05-17 DIAGNOSIS — Z01818 Encounter for other preprocedural examination: Secondary | ICD-10-CM

## 2024-05-17 DIAGNOSIS — D123 Benign neoplasm of transverse colon: Secondary | ICD-10-CM | POA: Insufficient documentation

## 2024-05-17 HISTORY — PX: COLONOSCOPY: SHX5424

## 2024-05-17 HISTORY — PX: POLYPECTOMY: SHX149

## 2024-05-17 LAB — POCT PREGNANCY, URINE: Preg Test, Ur: NEGATIVE

## 2024-05-17 LAB — GLUCOSE, CAPILLARY: Glucose-Capillary: 80 mg/dL (ref 70–99)

## 2024-05-17 MED ORDER — LACTATED RINGERS IV SOLN: INTRAVENOUS | Status: DC

## 2024-05-17 MED ORDER — LIDOCAINE HCL (CARDIAC) PF 100 MG/5ML IV SOSY
PREFILLED_SYRINGE | INTRAVENOUS | Status: DC | PRN
  Administered 2024-05-17: 50 mg via INTRAVENOUS

## 2024-05-17 MED ORDER — PROPOFOL 10 MG/ML IV BOLUS
INTRAVENOUS | Status: DC | PRN
  Administered 2024-05-17: 200 mg via INTRAVENOUS
  Administered 2024-05-17: 175 ug/kg/min via INTRAVENOUS

## 2024-05-17 NOTE — Op Note (Signed)
 [facility] Patient Name: Claire [name] Procedure Date: 05/17/2024 8:00 AM MRN: [id number] Date of Birth: 12-04-1980 Attending MD: Lamar Ozell Hollingshead , MD, [id number] CSN: [id number] Age: 44 Admit Type: Outpatient Procedure:                Colonoscopy Indications:              Screening in patient at increased risk: Family                            history of 1st-degree relative with colorectal                            cancer Providers:                Lamar Ozell Hollingshead, MD, Rosina Sprague, Daphne Mulch                            Technician, Technician Referring MD:              Medicines:                Propofol  per Anesthesia Complications:            No immediate complications. Estimated Blood Loss:     Estimated blood loss was minimal. Procedure:                Pre-Anesthesia Assessment:                           - Prior to the procedure, a History and Physical                            was performed, and patient medications and                            allergies were reviewed. The patient's tolerance of                            previous anesthesia was also reviewed. The risks                            and benefits of the procedure and the sedation                            options and risks were discussed with the patient.                            All questions were answered, and informed consent                            was obtained. Prior Anticoagulants: The patient has                            taken no anticoagulant or antiplatelet agents. ASA                            Grade  Assessment: III - A patient with severe                            systemic disease. After reviewing the risks and                            benefits, the patient was deemed in satisfactory                            condition to undergo the procedure.                           After obtaining informed consent, the colonoscope                            was passed under direct  vision. Throughout the                            procedure, the patient's blood pressure, pulse, and                            oxygen saturations were monitored continuously. The                            [id number] ([id number]) Colon was introduced through                            the anus and advanced to the the cecum, identified                            by appendiceal orifice and ileocecal valve. The                            colonoscopy was performed without difficulty. The                            patient tolerated the procedure well. The quality                            of the bowel preparation was adequate. The                            ileocecal valve, appendiceal orifice, and rectum                            were photographed. Scope In: 8:28:22 AM Scope Out: 8:48:31 AM Scope Withdrawal Time: 0 hours 15 minutes 54 seconds  Total Procedure Duration: 0 hours 20 minutes 9 seconds  Findings:      The perianal and digital rectal examinations were normal.      Two semi-pedunculated polyps were found in the splenic flexure. The       polyps were 4 to 5 mm in size. These polyps were removed with a cold       snare. Resection and retrieval were complete.  The exam was otherwise without abnormality on direct and retroflexion       views. Impression:               - Two 4 to 5 mm polyps at the splenic flexure,                            removed with a cold snare. Resected and retrieved.                           - The examination was otherwise normal on direct                            and retroflexion views. Moderate Sedation:      Moderate (conscious) sedation was administered by the nurse and       supervised by the endoscopist. The following parameters were monitored:       oxygen saturation, heart rate, blood pressure, respiratory rate, EKG,       adequacy of pulmonary ventilation, and response to care. Recommendation:           - Repeat colonoscopy date to be  determined after                            pending pathology results are reviewed for                            surveillance.                           - Return to GI office (date not yet determined). Procedure Code(s):        --- Professional ---                           [phone], Colonoscopy, flexible; with removal of                            tumor(s), polyp(s), or other lesion(s) by snare                            technique Diagnosis Code(s):        --- Professional ---                           Z80.0, Family history of malignant neoplasm of                            digestive organs                           D12.3, Benign neoplasm of transverse colon (hepatic                            flexure or splenic flexure) CPT copyright 2022 American Medical Association. All rights reserved. The codes documented in this report are preliminary and upon coder review may  be revised to meet current compliance requirements. Lamar HERO. Shaaron, MD Lamar Ozell Shaaron, MD  05/17/2024 9:02:38 AM This report has been signed electronically. Number of Addenda: 0

## 2024-05-17 NOTE — Anesthesia Preprocedure Evaluation (Addendum)
"                                    Anesthesia Evaluation  Patient identified by MRN, date of birth, ID band Patient awake    Reviewed: Allergy & Precautions, H&P , NPO status , Patient's Chart, lab work & pertinent test results  Airway Mallampati: III  TM Distance: >3 FB Neck ROM: Full    Dental no notable dental hx.    Pulmonary sleep apnea , former smoker   Pulmonary exam normal breath sounds clear to auscultation       Cardiovascular hypertension, Normal cardiovascular exam Rhythm:Regular Rate:Normal     Neuro/Psych  Headaches PSYCHIATRIC DISORDERS Anxiety Depression     Neuromuscular disease    GI/Hepatic negative GI ROS, Neg liver ROS,,,  Endo/Other    Class 4 obesity  Renal/GU negative Renal ROS  negative genitourinary   Musculoskeletal  (+) Arthritis ,    Abdominal  (+) + obese  Peds negative pediatric ROS (+)  Hematology negative hematology ROS (+)   Anesthesia Other Findings   Reproductive/Obstetrics negative OB ROS                              Anesthesia Physical Anesthesia Plan  ASA: 3  Anesthesia Plan: General   Post-op Pain Management:    Induction: Intravenous  PONV Risk Score and Plan:   Airway Management Planned: Nasal Cannula and Natural Airway  Additional Equipment:   Intra-op Plan:   Post-operative Plan:   Informed Consent: I have reviewed the patients History and Physical, chart, labs and discussed the procedure including the risks, benefits and alternatives for the proposed anesthesia with the patient or authorized representative who has indicated his/her understanding and acceptance.     Dental advisory given  Plan Discussed with: CRNA  Anesthesia Plan Comments: (Pregnancy test pending)         Anesthesia Quick Evaluation  "

## 2024-05-17 NOTE — Transfer of Care (Signed)
 Immediate Anesthesia Transfer of Care Note  Patient: Claire [name]  Procedure(s) Performed: COLONOSCOPY POLYPECTOMY, INTESTINE  Patient Location: Short Stay  Anesthesia Type:MAC  Level of Consciousness: drowsy, patient cooperative, and responds to stimulation  Airway & Oxygen Therapy: Patient Spontanous Breathing  Post-op Assessment: Report given to RN and Post -op Vital signs reviewed and stable  Post vital signs: Reviewed and stable  Last Vitals:  Vitals Value Taken Time  BP 116/74 05/17/24 08:52  Temp 36.9 C 05/17/24 08:52  Pulse 91 05/17/24 08:52  Resp 18 05/17/24 08:52  SpO2 99 % 05/17/24 08:52    Last Pain:  Vitals:   05/17/24 0852  TempSrc: Oral  PainSc: 0-No pain         Complications: No notable events documented.

## 2024-05-17 NOTE — Anesthesia Postprocedure Evaluation (Signed)
"   Anesthesia Post Note  Patient: Claire [name]  Procedure(s) Performed: COLONOSCOPY POLYPECTOMY, INTESTINE  Patient location during evaluation: PACU Anesthesia Type: General Level of consciousness: awake and alert Pain management: pain level controlled Vital Signs Assessment: post-procedure vital signs reviewed and stable Respiratory status: spontaneous breathing, nonlabored ventilation, respiratory function stable and patient connected to nasal cannula oxygen Cardiovascular status: blood pressure returned to baseline and stable Postop Assessment: no apparent nausea or vomiting Anesthetic complications: no   No notable events documented.   Last Vitals:  Vitals:   05/17/24 0714 05/17/24 0852  BP: 127/80 116/74  Pulse:  91  Resp: 18 18  Temp: 36.7 C 36.9 C  SpO2: 97% 99%    Last Pain:  Vitals:   05/17/24 0852  TempSrc: Oral  PainSc: 0-No pain                 Andrea Limes      "

## 2024-05-17 NOTE — Discharge Instructions (Signed)
" °  Colonoscopy Discharge Instructions  Read the instructions outlined below and refer to this sheet in the next few weeks. These discharge instructions provide you with general information on caring for yourself after you leave the hospital. Your doctor may also give you specific instructions. While your treatment has been planned according to the most current medical practices available, unavoidable complications occasionally occur. If you have any problems or questions after discharge, call Dr. Shaaron at [phone]. ACTIVITY You may resume your regular activity, but move at a slower pace for the next 24 hours.  Take frequent rest periods for the next 24 hours.  Walking will help get rid of the air and reduce the bloated feeling in your belly (abdomen).  No driving for 24 hours (because of the medicine (anesthesia) used during the test).   Do not sign any important legal documents or operate any machinery for 24 hours (because of the anesthesia used during the test).  NUTRITION Drink plenty of fluids.  You may resume your normal diet as instructed by your doctor.  Begin with a light meal and progress to your normal diet. Heavy or fried foods are harder to digest and may make you feel sick to your stomach (nauseated).  Avoid alcoholic beverages for 24 hours or as instructed.  MEDICATIONS You may resume your normal medications unless your doctor tells you otherwise.  WHAT YOU CAN EXPECT TODAY Some feelings of bloating in the abdomen.  Passage of more gas than usual.  Spotting of blood in your stool or on the toilet paper.  IF YOU HAD POLYPS REMOVED DURING THE COLONOSCOPY: No aspirin products for 7 days or as instructed.  No alcohol for 7 days or as instructed.  Eat a soft diet for the next 24 hours.  FINDING OUT THE RESULTS OF YOUR TEST Not all test results are available during your visit. If your test results are not back during the visit, make an appointment with your caregiver to find out the  results. Do not assume everything is normal if you have not heard from your caregiver or the medical facility. It is important for you to follow up on all of your test results.  SEEK IMMEDIATE MEDICAL ATTENTION IF: You have more than a spotting of blood in your stool.  Your belly is swollen (abdominal distention).  You are nauseated or vomiting.  You have a temperature over 101.  You have abdominal pain or discomfort that is severe or gets worse throughout the day.      2 polyps found and removed at  Further recommendations to follow pending review of pathology report    at patient request, called Jerrell Pais at [phone] findings and recommendations "

## 2024-05-17 NOTE — H&P (Signed)
 @LOGO @   Primary Care Physician:  Antonetta Rollene BRAVO, MD Primary Gastroenterologist:  Dr. Shaaron  Pre-Procedure History & Physical: HPI:  Claire [name] is a 44 y.o. female is here for a screening colonoscopy.  Mother with colon cancer.  Negative colonoscopy 2011  Past Medical History:  Diagnosis Date   Acne    facial   Arthritis    Depression    Phreesia 05/24/2020   High cholesterol    Hypertension    Migraines    Nicotine addiction    Obesity     Past Surgical History:  Procedure Laterality Date   CHOLECYSTECTOMY     COLONOSCOPY  10/2009   Dr. Shaaron; minimal anal canal hemorrhoids, otherwise normal rectum and colon.  Recommended repeat at age 71 due to family history.    Prior to Admission medications  Medication Sig Start Date End Date Taking? Authorizing Provider  ezetimibe  (ZETIA ) 10 MG tablet Take 1 tablet by mouth once daily 04/09/24  Yes Simpson, Margaret E, MD  Glucosamine HCl (GLUCOSAMINE PO) Take by mouth daily.   Yes [provider]  LO LOESTRIN FE  1 MG-10 MCG / 10 MCG tablet Take 1 tablet by mouth once daily 03/15/24  Yes Signa Nest A, NP  loratadine  (CLARITIN ) 10 MG tablet TAKE ONE TABLET BY MOUTH ONCE DAILY 03/10/17  Yes Antonetta Rollene BRAVO, MD  meloxicam  (MOBIC ) 15 MG tablet Take one tablet by mouth once daily for 5 days then one tablet once daily for uncontrolled arthritic pain as needed 03/12/24  Yes Antonetta Rollene BRAVO, MD  predniSONE  (DELTASONE ) 20 MG tablet Take 1 tablet (20 mg total) by mouth 2 (two) times daily with a meal. 03/12/24  Yes Antonetta Rollene BRAVO, MD  propranolol  (INDERAL ) 20 MG tablet Take 1 tablet by mouth twice daily 05/05/24  Yes Antonetta Rollene BRAVO, MD  rizatriptan  (MAXALT -MLT) 10 MG disintegrating tablet Take 1 tablet (10 mg total) by mouth as needed for migraine. May repeat in 2 hours if needed 01/16/23  Yes Antonetta Rollene BRAVO, MD  rosuvastatin  (CRESTOR ) 5 MG tablet Take 1 tablet by mouth once daily 04/20/24  Yes Antonetta Rollene BRAVO, MD  spironolactone  (ALDACTONE ) 25 MG tablet Take 1 tablet by mouth once daily 04/09/24  Yes Antonetta Rollene BRAVO, MD  venlafaxine  XR (EFFEXOR -XR) 150 MG 24 hr capsule TAKE 2 CAPSULES BY MOUTH ONCE DAILY WITH BREAKFAST 02/05/24  Yes Okey Barnie SAUNDERS, MD  vortioxetine  HBr (TRINTELLIX ) 10 MG TABS tablet Take 1 tablet (10 mg total) by mouth daily. 02/05/24  Yes Okey Barnie SAUNDERS, MD  vortioxetine  HBr (TRINTELLIX ) 20 MG TABS tablet Take 1 tablet (20 mg total) by mouth daily. 02/05/24  Yes Okey Barnie SAUNDERS, MD  ibuprofen  (ADVIL ) 800 MG tablet TAKE 1 TABLET BY MOUTH TWICE DAILY AS NEEDED FOR HEADACHE (MAX  USE  OF  TWICE  PER  WEEK) 05/05/24   Antonetta Rollene BRAVO, MD  phentermine  (ADIPEX-P ) 37.5 MG tablet Take 1 tablet (37.5 mg total) by mouth daily before breakfast. 03/12/24   Antonetta Rollene BRAVO, MD    Allergies as of 04/13/2024   (No Known Allergies)    Family History  Problem Relation Age of Onset   Cancer Paternal Grandfather        stomach   Emphysema Paternal Grandmother    Hypertension Maternal Grandmother    Diabetes Maternal Grandmother    Other Maternal Grandmother        has a pacemaker   Heart attack Maternal Grandfather  Diabetes Maternal Grandfather    Hypertension Father    Diabetes Father    Hyperlipidemia Father    Obesity Father    Hypertension Mother    Cancer Mother 63       colon   Obesity Mother    Diabetes Mother    Other Brother        MVA    Social History   Socioeconomic History   Marital status: Significant Other    Spouse name: Not on file   Number of children: 0   Years of education: Bachelors   Highest education level: Bachelor's degree (e.g., BA, AB, BS)  Occupational History   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   Right-handed.   Occasional use of caffeine .   Social Drivers of Health   Tobacco Use: Medium Risk (05/17/2024)   Patient History    Smoking Tobacco Use: Former    Smokeless Tobacco Use: Never    Passive Exposure: Not on file  Financial Resource Strain: Low Risk (11/12/2023)   Overall Financial Resource Strain (CARDIA)    Difficulty of Paying Living Expenses: Not hard at all  Food Insecurity: No Food Insecurity (11/12/2023)   Epic    Worried About Programme Researcher, Broadcasting/film/video in the Last Year: Never true    Ran Out of Food in the Last Year: Never true  Transportation Needs: No Transportation Needs (11/12/2023)   Epic    Lack of Transportation (Medical): No    Lack of Transportation (Non-Medical): No  Physical Activity: Inactive (11/12/2023)   Exercise Vital Sign    Days of Exercise per Week: 0 days    Minutes of Exercise per Session: Not on file  Stress: Stress Concern Present (11/12/2023)   [employer] of Occupational Health - Occupational Stress Questionnaire    Feeling of Stress: Very much  Social Connections: Moderately Isolated (11/12/2023)   Social Connection and Isolation Panel    Frequency of Communication with Friends and Family: More than three times a week    Frequency of Social Gatherings with Friends and Family: Once a week    Attends Religious Services: Never    Database Administrator or Organizations: No    Attends Engineer, Structural: Not on file    Marital Status: Living with partner  Intimate Partner Violence: Not At Risk (01/20/2023)   Humiliation, Afraid, Rape, and Kick questionnaire    Fear of Current or Ex-Partner: No    Emotionally Abused: No     Physically Abused: No    Sexually Abused: No  Depression (PHQ2-9): Medium Risk (03/12/2024)   Depression (PHQ2-9)    PHQ-2 Score: 6  Alcohol Screen: Low Risk (07/17/2023)   Alcohol Screen    Last Alcohol Screening Score (AUDIT): 1  Housing: Unknown (11/12/2023)   Epic    Unable  to Pay for Housing in the Last Year: No    Number of Times Moved in the Last Year: Not on file    Homeless in the Last Year: No  Utilities: Not At Risk (01/20/2023)   [facility] Utilities    Threatened with loss of utilities: No  Health Literacy: Not on file    Review of Systems: See HPI, otherwise negative ROS  Physical Exam: BP 127/80 (BP Location: Right Arm)   Temp 98.1 F (36.7 C)   Resp 18   Ht 5' 3 (1.6 m)   Wt (!) 167 kg   SpO2 97%   BMI 65.22 kg/m  General:   Alert,  Well-developed, well-nourished, pleasant and cooperative in NAD Head:  Normocephalic and atraumatic. Eyes:  Sclera clear, no icterus.   Conjunctiva pink. Ears:  Normal auditory acuity. Nose:  No deformity, discharge,  or lesions. Mouth:  No deformity or lesions, dentition normal. Neck:  Supple; no masses or thyromegaly. Lungs:  Clear throughout to auscultation.   No wheezes, crackles, or rhonchi. No acute distress. Heart:  Regular rate and rhythm; no murmurs, clicks, rubs,  or gallops. Abdomen:  Soft, nontender and nondistended. No masses, hepatosplenomegaly or hernias noted. Normal bowel sounds, without guarding, and without rebound.   Msk:  Symmetrical without gross deformities. Normal posture. Pulses:  Normal pulses noted. Extremities:  Without clubbing or edema. Neurologic:  Alert and  oriented x4;  grossly normal neurologically. Skin:  Intact without significant lesions or rashes. Cervical Nodes:  No significant cervical adenopathy. Psych:  Alert and cooperative. Normal mood and affect.  Impression/Plan: FAITHLYNN DEELEY is now here to undergo a screening colonoscopy.  Risks, benefits, limitations, imponderables and  alternatives regarding colonoscopy have been reviewed with the patient. Questions have been answered. All parties agreeable.     Notice:  This dictation was prepared with Dragon dictation along with smaller phrase technology. Any transcriptional errors that result from this process are unintentional and may not be corrected upon review.

## 2024-05-18 ENCOUNTER — Encounter (HOSPITAL_COMMUNITY): Payer: Self-pay | Admitting: Internal Medicine

## 2024-05-19 LAB — SURGICAL PATHOLOGY

## 2024-05-20 ENCOUNTER — Ambulatory Visit: Payer: Self-pay | Admitting: Internal Medicine

## 2024-05-28 ENCOUNTER — Ambulatory Visit

## 2024-06-01 ENCOUNTER — Encounter (HOSPITAL_COMMUNITY): Payer: Self-pay

## 2024-06-01 ENCOUNTER — Other Ambulatory Visit (HOSPITAL_COMMUNITY): Payer: Self-pay | Admitting: Psychiatry

## 2024-06-01 MED ORDER — VORTIOXETINE HBR 20 MG PO TABS: 30.0000 mg | ORAL_TABLET | Freq: Every day | ORAL | 2 refills | Status: AC

## 2024-06-04 ENCOUNTER — Other Ambulatory Visit: Payer: Self-pay | Admitting: Adult Health

## 2024-06-15 ENCOUNTER — Encounter: Admitting: Family Medicine

## 2024-08-05 ENCOUNTER — Telehealth (HOSPITAL_COMMUNITY): Admitting: Psychiatry
